# Patient Record
Sex: Female | Born: 1935
Health system: Southern US, Community
[De-identification: ages and names within clinical notes are randomized; demographics above are authoritative.]

## PROBLEM LIST (undated history)

## (undated) DIAGNOSIS — Z9221 Personal history of antineoplastic chemotherapy: Secondary | ICD-10-CM

## (undated) DIAGNOSIS — C50919 Malignant neoplasm of unspecified site of unspecified female breast: Secondary | ICD-10-CM

## (undated) DIAGNOSIS — N3010 Interstitial cystitis (chronic) without hematuria: Secondary | ICD-10-CM

## (undated) DIAGNOSIS — C50912 Malignant neoplasm of unspecified site of left female breast: Secondary | ICD-10-CM

## (undated) DIAGNOSIS — M1990 Unspecified osteoarthritis, unspecified site: Secondary | ICD-10-CM

## (undated) DIAGNOSIS — H3530 Unspecified macular degeneration: Secondary | ICD-10-CM

## (undated) DIAGNOSIS — K08109 Complete loss of teeth, unspecified cause, unspecified class: Secondary | ICD-10-CM

## (undated) DIAGNOSIS — I809 Phlebitis and thrombophlebitis of unspecified site: Secondary | ICD-10-CM

## (undated) DIAGNOSIS — K2970 Gastritis, unspecified, without bleeding: Secondary | ICD-10-CM

## (undated) DIAGNOSIS — Z923 Personal history of irradiation: Secondary | ICD-10-CM

## (undated) DIAGNOSIS — C3490 Malignant neoplasm of unspecified part of unspecified bronchus or lung: Principal | ICD-10-CM

## (undated) DIAGNOSIS — R0602 Shortness of breath: Secondary | ICD-10-CM

## (undated) DIAGNOSIS — J45909 Unspecified asthma, uncomplicated: Secondary | ICD-10-CM

## (undated) DIAGNOSIS — Z972 Presence of dental prosthetic device (complete) (partial): Secondary | ICD-10-CM

## (undated) DIAGNOSIS — Z973 Presence of spectacles and contact lenses: Secondary | ICD-10-CM

## (undated) DIAGNOSIS — K5790 Diverticulosis of intestine, part unspecified, without perforation or abscess without bleeding: Secondary | ICD-10-CM

## (undated) HISTORY — PX: EYE SURGERY: SHX253

## (undated) HISTORY — DX: Malignant neoplasm of unspecified part of unspecified bronchus or lung: C34.90

## (undated) HISTORY — PX: CHOLECYSTECTOMY: SHX55

## (undated) HISTORY — PX: BREAST SURGERY: SHX581

## (undated) HISTORY — DX: Diverticulosis of intestine, part unspecified, without perforation or abscess without bleeding: K57.90

## (undated) HISTORY — DX: Malignant neoplasm of unspecified site of unspecified female breast: C50.919

## (undated) HISTORY — DX: Phlebitis and thrombophlebitis of unspecified site: I80.9

## (undated) HISTORY — DX: Unspecified asthma, uncomplicated: J45.909

## (undated) HISTORY — DX: Gastritis, unspecified, without bleeding: K29.70

## (undated) HISTORY — DX: Unspecified macular degeneration: H35.30

## (undated) HISTORY — DX: Interstitial cystitis (chronic) without hematuria: N30.10

---

## 1968-08-01 DIAGNOSIS — I809 Phlebitis and thrombophlebitis of unspecified site: Secondary | ICD-10-CM

## 1968-08-01 HISTORY — DX: Phlebitis and thrombophlebitis of unspecified site: I80.9

## 1999-10-01 ENCOUNTER — Other Ambulatory Visit: Admission: RE | Admit: 1999-10-01 | Discharge: 1999-10-20 | Payer: Self-pay | Admitting: *Deleted

## 2000-08-14 ENCOUNTER — Ambulatory Visit (HOSPITAL_COMMUNITY): Admission: RE | Admit: 2000-08-14 | Discharge: 2000-08-14 | Payer: Self-pay | Admitting: *Deleted

## 2001-01-04 ENCOUNTER — Other Ambulatory Visit: Admission: RE | Admit: 2001-01-04 | Discharge: 2001-01-04 | Payer: Self-pay | Admitting: *Deleted

## 2002-11-25 ENCOUNTER — Other Ambulatory Visit: Admission: RE | Admit: 2002-11-25 | Discharge: 2002-11-25 | Payer: Self-pay | Admitting: Obstetrics and Gynecology

## 2003-12-03 ENCOUNTER — Ambulatory Visit (HOSPITAL_COMMUNITY): Admission: RE | Admit: 2003-12-03 | Discharge: 2003-12-03 | Payer: Self-pay | Admitting: *Deleted

## 2005-01-18 ENCOUNTER — Other Ambulatory Visit: Admission: RE | Admit: 2005-01-18 | Discharge: 2005-01-18 | Payer: Self-pay | Admitting: Obstetrics and Gynecology

## 2006-07-14 ENCOUNTER — Encounter: Admission: RE | Admit: 2006-07-14 | Discharge: 2006-07-14 | Payer: Self-pay | Admitting: Internal Medicine

## 2006-07-27 ENCOUNTER — Ambulatory Visit (HOSPITAL_COMMUNITY): Admission: RE | Admit: 2006-07-27 | Discharge: 2006-07-27 | Payer: Self-pay | Admitting: Internal Medicine

## 2006-08-01 DIAGNOSIS — C3490 Malignant neoplasm of unspecified part of unspecified bronchus or lung: Secondary | ICD-10-CM

## 2006-08-01 HISTORY — DX: Malignant neoplasm of unspecified part of unspecified bronchus or lung: C34.90

## 2006-09-01 ENCOUNTER — Encounter (INDEPENDENT_AMBULATORY_CARE_PROVIDER_SITE_OTHER): Payer: Self-pay | Admitting: Specialist

## 2006-09-01 ENCOUNTER — Ambulatory Visit: Payer: Self-pay | Admitting: Thoracic Surgery

## 2006-09-01 ENCOUNTER — Inpatient Hospital Stay (HOSPITAL_COMMUNITY): Admission: RE | Admit: 2006-09-01 | Discharge: 2006-09-06 | Payer: Self-pay | Admitting: Thoracic Surgery

## 2006-09-01 HISTORY — PX: OTHER SURGICAL HISTORY: SHX169

## 2006-09-11 ENCOUNTER — Ambulatory Visit: Payer: Self-pay | Admitting: Oncology

## 2006-09-12 ENCOUNTER — Ambulatory Visit: Payer: Self-pay | Admitting: Thoracic Surgery

## 2006-09-12 ENCOUNTER — Encounter: Admission: RE | Admit: 2006-09-12 | Discharge: 2006-09-12 | Payer: Self-pay | Admitting: Thoracic Surgery

## 2006-09-26 LAB — CBC WITH DIFFERENTIAL/PLATELET
Basophils Absolute: 0 10*3/uL (ref 0.0–0.1)
EOS%: 1.6 % (ref 0.0–7.0)
HCT: 34.2 % — ABNORMAL LOW (ref 34.8–46.6)
HGB: 12 g/dL (ref 11.6–15.9)
MCH: 30.9 pg (ref 26.0–34.0)
MONO#: 0.2 10*3/uL (ref 0.1–0.9)
NEUT#: 2.8 10*3/uL (ref 1.5–6.5)
NEUT%: 68.8 % (ref 39.6–76.8)
RDW: 13.3 % (ref 11.3–14.5)
WBC: 4 10*3/uL (ref 3.9–10.0)
lymph#: 0.9 10*3/uL (ref 0.9–3.3)

## 2006-09-26 LAB — COMPREHENSIVE METABOLIC PANEL
AST: 23 U/L (ref 0–37)
Albumin: 4.3 g/dL (ref 3.5–5.2)
Alkaline Phosphatase: 36 U/L — ABNORMAL LOW (ref 39–117)
BUN: 9 mg/dL (ref 6–23)
Calcium: 10.1 mg/dL (ref 8.4–10.5)
Chloride: 100 mEq/L (ref 96–112)
Glucose, Bld: 155 mg/dL — ABNORMAL HIGH (ref 70–99)
Potassium: 4.2 mEq/L (ref 3.5–5.3)
Sodium: 139 mEq/L (ref 135–145)
Total Protein: 7 g/dL (ref 6.0–8.3)

## 2006-09-26 LAB — LACTATE DEHYDROGENASE: LDH: 118 U/L (ref 94–250)

## 2006-09-28 ENCOUNTER — Ambulatory Visit (HOSPITAL_COMMUNITY): Admission: RE | Admit: 2006-09-28 | Discharge: 2006-09-28 | Payer: Self-pay | Admitting: Oncology

## 2006-10-04 ENCOUNTER — Encounter: Admission: RE | Admit: 2006-10-04 | Discharge: 2006-10-04 | Payer: Self-pay | Admitting: Thoracic Surgery

## 2006-10-10 LAB — CBC WITH DIFFERENTIAL/PLATELET
Basophils Absolute: 0 10*3/uL (ref 0.0–0.1)
EOS%: 2.7 % (ref 0.0–7.0)
HCT: 31.5 % — ABNORMAL LOW (ref 34.8–46.6)
HGB: 11.2 g/dL — ABNORMAL LOW (ref 11.6–15.9)
MONO#: 0.1 10*3/uL (ref 0.1–0.9)
NEUT#: 1.1 10*3/uL — ABNORMAL LOW (ref 1.5–6.5)
NEUT%: 49.4 % (ref 39.6–76.8)
RDW: 12.9 % (ref 11.3–14.5)
WBC: 2.2 10*3/uL — ABNORMAL LOW (ref 3.9–10.0)
lymph#: 1 10*3/uL (ref 0.9–3.3)

## 2006-10-11 ENCOUNTER — Ambulatory Visit: Payer: Self-pay | Admitting: Thoracic Surgery

## 2006-10-16 ENCOUNTER — Ambulatory Visit (HOSPITAL_COMMUNITY): Admission: RE | Admit: 2006-10-16 | Discharge: 2006-10-16 | Payer: Self-pay | Admitting: Oncology

## 2006-10-16 LAB — CBC WITH DIFFERENTIAL/PLATELET
Basophils Absolute: 0.1 10*3/uL (ref 0.0–0.1)
EOS%: 0.4 % (ref 0.0–7.0)
Eosinophils Absolute: 0 10*3/uL (ref 0.0–0.5)
HCT: 30.8 % — ABNORMAL LOW (ref 34.8–46.6)
HGB: 10.9 g/dL — ABNORMAL LOW (ref 11.6–15.9)
MCH: 30.7 pg (ref 26.0–34.0)
MCV: 87 fL (ref 81.0–101.0)
MONO%: 2.4 % (ref 0.0–13.0)
NEUT#: 6.5 10*3/uL (ref 1.5–6.5)
NEUT%: 79.6 % — ABNORMAL HIGH (ref 39.6–76.8)
RDW: 12.9 % (ref 11.3–14.5)
lymph#: 1.4 10*3/uL (ref 0.9–3.3)

## 2006-10-17 LAB — D-DIMER, QUANTITATIVE: D-Dimer, Quant: 0.37 ug/mL-FEU (ref 0.00–0.48)

## 2006-10-24 LAB — CBC WITH DIFFERENTIAL/PLATELET
Basophils Absolute: 0.1 10*3/uL (ref 0.0–0.1)
HCT: 32.1 % — ABNORMAL LOW (ref 34.8–46.6)
HGB: 11.2 g/dL — ABNORMAL LOW (ref 11.6–15.9)
LYMPH%: 21.6 % (ref 14.0–48.0)
MONO#: 0.5 10*3/uL (ref 0.1–0.9)
NEUT%: 68.6 % (ref 39.6–76.8)
Platelets: 347 10*3/uL (ref 145–400)
WBC: 5.7 10*3/uL (ref 3.9–10.0)
lymph#: 1.2 10*3/uL (ref 0.9–3.3)

## 2006-10-24 LAB — COMPREHENSIVE METABOLIC PANEL
AST: 29 U/L (ref 0–37)
Alkaline Phosphatase: 44 U/L (ref 39–117)
BUN: 9 mg/dL (ref 6–23)
Creatinine, Ser: 0.65 mg/dL (ref 0.40–1.20)
Total Bilirubin: 0.3 mg/dL (ref 0.3–1.2)

## 2006-10-25 ENCOUNTER — Ambulatory Visit: Payer: Self-pay | Admitting: Oncology

## 2006-10-30 ENCOUNTER — Ambulatory Visit: Payer: Self-pay | Admitting: Critical Care Medicine

## 2006-10-31 LAB — CBC WITH DIFFERENTIAL/PLATELET
Basophils Absolute: 0 10*3/uL (ref 0.0–0.1)
Eosinophils Absolute: 0 10*3/uL (ref 0.0–0.5)
HGB: 9.6 g/dL — ABNORMAL LOW (ref 11.6–15.9)
LYMPH%: 23.7 % (ref 14.0–48.0)
MCV: 86.6 fL (ref 81.0–101.0)
MONO%: 1.3 % (ref 0.0–13.0)
NEUT#: 2.3 10*3/uL (ref 1.5–6.5)
Platelets: 192 10*3/uL (ref 145–400)

## 2006-11-07 LAB — URINALYSIS, MICROSCOPIC - CHCC
Blood: NEGATIVE
Glucose: NEGATIVE g/dL
Leukocyte Esterase: NEGATIVE
Nitrite: NEGATIVE
Protein: NEGATIVE mg/dL

## 2006-11-07 LAB — CBC WITH DIFFERENTIAL/PLATELET
Eosinophils Absolute: 0 10*3/uL (ref 0.0–0.5)
LYMPH%: 10.3 % — ABNORMAL LOW (ref 14.0–48.0)
MCHC: 35.5 g/dL (ref 32.0–36.0)
MCV: 87.3 fL (ref 81.0–101.0)
MONO%: 4.3 % (ref 0.0–13.0)
NEUT#: 13.7 10*3/uL — ABNORMAL HIGH (ref 1.5–6.5)
NEUT%: 84.9 % — ABNORMAL HIGH (ref 39.6–76.8)
Platelets: 63 10*3/uL — ABNORMAL LOW (ref 145–400)
RBC: 2.99 10*6/uL — ABNORMAL LOW (ref 3.70–5.32)

## 2006-11-08 LAB — URINE CULTURE

## 2006-11-14 LAB — CBC WITH DIFFERENTIAL/PLATELET
Basophils Absolute: 0.1 10*3/uL (ref 0.0–0.1)
EOS%: 0.6 % (ref 0.0–7.0)
HGB: 9.5 g/dL — ABNORMAL LOW (ref 11.6–15.9)
MCH: 30.4 pg (ref 26.0–34.0)
MONO#: 0.5 10*3/uL (ref 0.1–0.9)
NEUT#: 4.5 10*3/uL (ref 1.5–6.5)
RDW: 16.4 % — ABNORMAL HIGH (ref 11.3–14.5)
WBC: 6.4 10*3/uL (ref 3.9–10.0)
lymph#: 1.2 10*3/uL (ref 0.9–3.3)

## 2006-11-14 LAB — COMPREHENSIVE METABOLIC PANEL
ALT: 25 U/L (ref 0–35)
AST: 29 U/L (ref 0–37)
Albumin: 4.6 g/dL (ref 3.5–5.2)
BUN: 8 mg/dL (ref 6–23)
Calcium: 9.1 mg/dL (ref 8.4–10.5)
Chloride: 101 mEq/L (ref 96–112)
Potassium: 4.2 mEq/L (ref 3.5–5.3)

## 2006-11-21 LAB — CBC WITH DIFFERENTIAL/PLATELET
EOS%: 0.3 % (ref 0.0–7.0)
MCH: 31.7 pg (ref 26.0–34.0)
MCV: 88.9 fL (ref 81.0–101.0)
MONO%: 2.3 % (ref 0.0–13.0)
NEUT#: 3.3 10*3/uL (ref 1.5–6.5)
RBC: 2.81 10*6/uL — ABNORMAL LOW (ref 3.70–5.32)
RDW: 17.6 % — ABNORMAL HIGH (ref 11.3–14.5)

## 2006-11-22 ENCOUNTER — Encounter: Admission: RE | Admit: 2006-11-22 | Discharge: 2006-11-22 | Payer: Self-pay | Admitting: Thoracic Surgery

## 2006-11-22 ENCOUNTER — Encounter (HOSPITAL_COMMUNITY): Admission: RE | Admit: 2006-11-22 | Discharge: 2006-12-15 | Payer: Self-pay | Admitting: Oncology

## 2006-11-22 ENCOUNTER — Ambulatory Visit: Payer: Self-pay | Admitting: Thoracic Surgery

## 2006-11-23 LAB — TYPE & CROSSMATCH - CHCC

## 2006-11-28 LAB — CBC WITH DIFFERENTIAL/PLATELET
Eosinophils Absolute: 0.1 10*3/uL (ref 0.0–0.5)
HGB: 11.2 g/dL — ABNORMAL LOW (ref 11.6–15.9)
LYMPH%: 16.3 % (ref 14.0–48.0)
MONO#: 0.6 10*3/uL (ref 0.1–0.9)
NEUT#: 6.6 10*3/uL — ABNORMAL HIGH (ref 1.5–6.5)
Platelets: 74 10*3/uL — ABNORMAL LOW (ref 145–400)
RBC: 3.71 10*6/uL (ref 3.70–5.32)
RDW: 16 % — ABNORMAL HIGH (ref 11.3–14.5)
WBC: 8.8 10*3/uL (ref 3.9–10.0)

## 2006-12-04 LAB — CBC WITH DIFFERENTIAL/PLATELET
Basophils Absolute: 0.1 10*3/uL (ref 0.0–0.1)
Eosinophils Absolute: 0 10*3/uL (ref 0.0–0.5)
HCT: 33.3 % — ABNORMAL LOW (ref 34.8–46.6)
LYMPH%: 18.6 % (ref 14.0–48.0)
MONO#: 0.4 10*3/uL (ref 0.1–0.9)
NEUT#: 4 10*3/uL (ref 1.5–6.5)
NEUT%: 72.8 % (ref 39.6–76.8)
Platelets: 192 10*3/uL (ref 145–400)
WBC: 5.6 10*3/uL (ref 3.9–10.0)

## 2006-12-04 LAB — COMPREHENSIVE METABOLIC PANEL
CO2: 28 mEq/L (ref 19–32)
Creatinine, Ser: 0.64 mg/dL (ref 0.40–1.20)
Glucose, Bld: 168 mg/dL — ABNORMAL HIGH (ref 70–99)
Total Bilirubin: 0.3 mg/dL (ref 0.3–1.2)
Total Protein: 7 g/dL (ref 6.0–8.3)

## 2006-12-04 LAB — LACTATE DEHYDROGENASE: LDH: 139 U/L (ref 94–250)

## 2006-12-05 ENCOUNTER — Ambulatory Visit: Payer: Self-pay | Admitting: Oncology

## 2006-12-12 LAB — CBC WITH DIFFERENTIAL/PLATELET
BASO%: 0.3 % (ref 0.0–2.0)
HCT: 28.4 % — ABNORMAL LOW (ref 34.8–46.6)
HGB: 9.8 g/dL — ABNORMAL LOW (ref 11.6–15.9)
MCHC: 34.5 g/dL (ref 32.0–36.0)
MONO#: 0.1 10*3/uL (ref 0.1–0.9)
NEUT%: 78.5 % — ABNORMAL HIGH (ref 39.6–76.8)
WBC: 4.3 10*3/uL (ref 3.9–10.0)
lymph#: 0.8 10*3/uL — ABNORMAL LOW (ref 0.9–3.3)

## 2006-12-15 LAB — CBC WITH DIFFERENTIAL/PLATELET
Basophils Absolute: 0 10*3/uL (ref 0.0–0.1)
EOS%: 0.5 % (ref 0.0–7.0)
HCT: 26.4 % — ABNORMAL LOW (ref 34.8–46.6)
HGB: 9.5 g/dL — ABNORMAL LOW (ref 11.6–15.9)
MCH: 31.5 pg (ref 26.0–34.0)
MCV: 87.4 fL (ref 81.0–101.0)
MONO%: 6.4 % (ref 0.0–13.0)
NEUT%: 58.7 % (ref 39.6–76.8)
Platelets: 70 10*3/uL — ABNORMAL LOW (ref 145–400)

## 2006-12-19 LAB — CBC WITH DIFFERENTIAL/PLATELET
Eosinophils Absolute: 0 10*3/uL (ref 0.0–0.5)
LYMPH%: 15.9 % (ref 14.0–48.0)
MCV: 89.3 fL (ref 81.0–101.0)
MONO%: 3.9 % (ref 0.0–13.0)
NEUT#: 6.4 10*3/uL (ref 1.5–6.5)
Platelets: 62 10*3/uL — ABNORMAL LOW (ref 145–400)
RBC: 3.87 10*6/uL (ref 3.70–5.32)

## 2006-12-26 LAB — CBC WITH DIFFERENTIAL/PLATELET
BASO%: 0.8 % (ref 0.0–2.0)
EOS%: 0.6 % (ref 0.0–7.0)
LYMPH%: 18.8 % (ref 14.0–48.0)
MCH: 31.7 pg (ref 26.0–34.0)
MCHC: 35 g/dL (ref 32.0–36.0)
MCV: 90.4 fL (ref 81.0–101.0)
MONO#: 0.6 10*3/uL (ref 0.1–0.9)
MONO%: 8.5 % (ref 0.0–13.0)
Platelets: 182 10*3/uL (ref 145–400)
RBC: 3.84 10*6/uL (ref 3.70–5.32)
WBC: 7.2 10*3/uL (ref 3.9–10.0)

## 2007-01-01 LAB — CBC WITH DIFFERENTIAL/PLATELET
BASO%: 1 % (ref 0.0–2.0)
EOS%: 0.6 % (ref 0.0–7.0)
MCHC: 35.1 g/dL (ref 32.0–36.0)
MONO#: 0.4 10*3/uL (ref 0.1–0.9)
RBC: 3.94 10*6/uL (ref 3.70–5.32)
RDW: 21.8 % — ABNORMAL HIGH (ref 11.3–14.5)
WBC: 4.2 10*3/uL (ref 3.9–10.0)
lymph#: 0.9 10*3/uL (ref 0.9–3.3)

## 2007-01-01 LAB — COMPREHENSIVE METABOLIC PANEL
ALT: 31 U/L (ref 0–35)
AST: 52 U/L — ABNORMAL HIGH (ref 0–37)
CO2: 26 mEq/L (ref 19–32)
Calcium: 9.8 mg/dL (ref 8.4–10.5)
Chloride: 101 mEq/L (ref 96–112)
Sodium: 137 mEq/L (ref 135–145)
Total Protein: 7 g/dL (ref 6.0–8.3)

## 2007-01-01 LAB — LACTATE DEHYDROGENASE: LDH: 149 U/L (ref 94–250)

## 2007-01-29 ENCOUNTER — Ambulatory Visit: Payer: Self-pay | Admitting: Oncology

## 2007-01-30 LAB — CBC WITH DIFFERENTIAL/PLATELET
BASO%: 0.5 % (ref 0.0–2.0)
Basophils Absolute: 0 10*3/uL (ref 0.0–0.1)
EOS%: 3.1 % (ref 0.0–7.0)
HCT: 34.8 % (ref 34.8–46.6)
LYMPH%: 30.4 % (ref 14.0–48.0)
MCH: 33 pg (ref 26.0–34.0)
MCHC: 35.3 g/dL (ref 32.0–36.0)
MCV: 93.4 fL (ref 81.0–101.0)
MONO%: 7.1 % (ref 0.0–13.0)
NEUT%: 58.9 % (ref 39.6–76.8)
Platelets: 169 10*3/uL (ref 145–400)
lymph#: 1.1 10*3/uL (ref 0.9–3.3)

## 2007-01-30 LAB — COMPREHENSIVE METABOLIC PANEL
ALT: 27 U/L (ref 0–35)
AST: 33 U/L (ref 0–37)
Alkaline Phosphatase: 31 U/L — ABNORMAL LOW (ref 39–117)
BUN: 8 mg/dL (ref 6–23)
Creatinine, Ser: 0.7 mg/dL (ref 0.40–1.20)
Total Bilirubin: 0.4 mg/dL (ref 0.3–1.2)

## 2007-02-14 ENCOUNTER — Encounter: Admission: RE | Admit: 2007-02-14 | Discharge: 2007-02-14 | Payer: Self-pay | Admitting: Thoracic Surgery

## 2007-02-14 ENCOUNTER — Ambulatory Visit: Payer: Self-pay | Admitting: Thoracic Surgery

## 2007-04-03 ENCOUNTER — Ambulatory Visit (HOSPITAL_COMMUNITY): Admission: RE | Admit: 2007-04-03 | Discharge: 2007-04-03 | Payer: Self-pay | Admitting: Oncology

## 2007-04-05 ENCOUNTER — Ambulatory Visit: Payer: Self-pay | Admitting: Oncology

## 2007-04-09 LAB — CBC WITH DIFFERENTIAL/PLATELET
Basophils Absolute: 0 10*3/uL (ref 0.0–0.1)
EOS%: 1.4 % (ref 0.0–7.0)
Eosinophils Absolute: 0.1 10*3/uL (ref 0.0–0.5)
HCT: 35.6 % (ref 34.8–46.6)
HGB: 13 g/dL (ref 11.6–15.9)
MCH: 33.9 pg (ref 26.0–34.0)
MCV: 93.1 fL (ref 81.0–101.0)
MONO%: 5.3 % (ref 0.0–13.0)
NEUT#: 3 10*3/uL (ref 1.5–6.5)
NEUT%: 66.8 % (ref 39.6–76.8)
RDW: 12.6 % (ref 11.3–14.5)
lymph#: 1.2 10*3/uL (ref 0.9–3.3)

## 2007-04-09 LAB — COMPREHENSIVE METABOLIC PANEL
ALT: 25 U/L (ref 0–35)
AST: 40 U/L — ABNORMAL HIGH (ref 0–37)
Albumin: 4.5 g/dL (ref 3.5–5.2)
Calcium: 9.9 mg/dL (ref 8.4–10.5)
Chloride: 100 mEq/L (ref 96–112)
Potassium: 4 mEq/L (ref 3.5–5.3)
Total Protein: 7.2 g/dL (ref 6.0–8.3)

## 2007-04-24 ENCOUNTER — Encounter: Admission: RE | Admit: 2007-04-24 | Discharge: 2007-04-24 | Payer: Self-pay | Admitting: Pediatrics

## 2007-06-01 ENCOUNTER — Ambulatory Visit: Payer: Self-pay | Admitting: Oncology

## 2007-06-04 ENCOUNTER — Ambulatory Visit (HOSPITAL_COMMUNITY): Admission: RE | Admit: 2007-06-04 | Discharge: 2007-06-04 | Payer: Self-pay | Admitting: Oncology

## 2007-06-06 ENCOUNTER — Ambulatory Visit (HOSPITAL_COMMUNITY): Admission: RE | Admit: 2007-06-06 | Discharge: 2007-06-06 | Payer: Self-pay | Admitting: Oncology

## 2007-06-11 LAB — URINALYSIS, MICROSCOPIC - CHCC
Bilirubin (Urine): NEGATIVE
Specific Gravity, Urine: 1.025 (ref 1.003–1.035)
pH: 6 (ref 4.6–8.0)

## 2007-06-11 LAB — CBC WITH DIFFERENTIAL/PLATELET
Basophils Absolute: 0 10*3/uL (ref 0.0–0.1)
EOS%: 1 % (ref 0.0–7.0)
MCH: 33 pg (ref 26.0–34.0)
MCV: 92.7 fL (ref 81.0–101.0)
MONO%: 7.3 % (ref 0.0–13.0)
RBC: 3.8 10*6/uL (ref 3.70–5.32)
RDW: 13.2 % (ref 11.3–14.5)

## 2007-06-11 LAB — COMPREHENSIVE METABOLIC PANEL
AST: 38 U/L — ABNORMAL HIGH (ref 0–37)
Albumin: 4.4 g/dL (ref 3.5–5.2)
Alkaline Phosphatase: 38 U/L — ABNORMAL LOW (ref 39–117)
BUN: 8 mg/dL (ref 6–23)
Potassium: 4 mEq/L (ref 3.5–5.3)
Sodium: 137 mEq/L (ref 135–145)

## 2007-06-13 LAB — URINE CULTURE

## 2007-08-09 ENCOUNTER — Ambulatory Visit: Payer: Self-pay | Admitting: Oncology

## 2007-08-13 LAB — COMPREHENSIVE METABOLIC PANEL
ALT: 9 U/L (ref 0–35)
AST: 15 U/L (ref 0–37)
Albumin: 4.3 g/dL (ref 3.5–5.2)
BUN: 10 mg/dL (ref 6–23)
CO2: 27 mEq/L (ref 19–32)
Chloride: 100 mEq/L (ref 96–112)
Creatinine, Ser: 0.81 mg/dL (ref 0.40–1.20)
Potassium: 4 mEq/L (ref 3.5–5.3)

## 2007-08-13 LAB — CBC WITH DIFFERENTIAL/PLATELET
BASO%: 0.2 % (ref 0.0–2.0)
Basophils Absolute: 0 10*3/uL (ref 0.0–0.1)
EOS%: 3.9 % (ref 0.0–7.0)
HCT: 34.5 % — ABNORMAL LOW (ref 34.8–46.6)
HGB: 12.2 g/dL (ref 11.6–15.9)
MCH: 32.2 pg (ref 26.0–34.0)
MONO#: 0.3 10*3/uL (ref 0.1–0.9)
NEUT%: 66.4 % (ref 39.6–76.8)
RDW: 13.1 % (ref 11.3–14.5)
WBC: 4.6 10*3/uL (ref 3.9–10.0)
lymph#: 1 10*3/uL (ref 0.9–3.3)

## 2007-08-15 ENCOUNTER — Ambulatory Visit: Payer: Self-pay | Admitting: Thoracic Surgery

## 2007-08-15 ENCOUNTER — Encounter: Admission: RE | Admit: 2007-08-15 | Discharge: 2007-08-15 | Payer: Self-pay | Admitting: Thoracic Surgery

## 2007-10-10 ENCOUNTER — Ambulatory Visit: Payer: Self-pay | Admitting: Oncology

## 2007-10-12 LAB — COMPREHENSIVE METABOLIC PANEL
ALT: 23 U/L (ref 0–35)
BUN: 7 mg/dL (ref 6–23)
CO2: 28 mEq/L (ref 19–32)
Calcium: 9.3 mg/dL (ref 8.4–10.5)
Chloride: 98 mEq/L (ref 96–112)
Creatinine, Ser: 0.74 mg/dL (ref 0.40–1.20)
Glucose, Bld: 109 mg/dL — ABNORMAL HIGH (ref 70–99)
Total Bilirubin: 0.3 mg/dL (ref 0.3–1.2)

## 2007-10-12 LAB — CBC WITH DIFFERENTIAL/PLATELET
Basophils Absolute: 0 10*3/uL (ref 0.0–0.1)
EOS%: 3.7 % (ref 0.0–7.0)
HGB: 12.1 g/dL (ref 11.6–15.9)
MCH: 32.2 pg (ref 26.0–34.0)
MCHC: 35.5 g/dL (ref 32.0–36.0)
MCV: 90.8 fL (ref 81.0–101.0)
MONO%: 8.4 % (ref 0.0–13.0)
NEUT%: 55.1 % (ref 39.6–76.8)
RDW: 13.5 % (ref 11.3–14.5)

## 2007-10-12 LAB — LACTATE DEHYDROGENASE: LDH: 121 U/L (ref 94–250)

## 2007-11-29 ENCOUNTER — Encounter: Admission: RE | Admit: 2007-11-29 | Discharge: 2007-11-29 | Payer: Self-pay | Admitting: Thoracic Surgery

## 2007-12-05 ENCOUNTER — Ambulatory Visit: Payer: Self-pay | Admitting: Thoracic Surgery

## 2007-12-11 ENCOUNTER — Ambulatory Visit: Payer: Self-pay | Admitting: Oncology

## 2008-03-11 ENCOUNTER — Ambulatory Visit: Payer: Self-pay | Admitting: Oncology

## 2008-03-13 LAB — COMPREHENSIVE METABOLIC PANEL
ALT: 25 U/L (ref 0–35)
AST: 34 U/L (ref 0–37)
Calcium: 9.8 mg/dL (ref 8.4–10.5)
Chloride: 97 mEq/L (ref 96–112)
Creatinine, Ser: 0.67 mg/dL (ref 0.40–1.20)
Potassium: 4.3 mEq/L (ref 3.5–5.3)
Sodium: 136 mEq/L (ref 135–145)
Total Protein: 7.2 g/dL (ref 6.0–8.3)

## 2008-03-13 LAB — CBC WITH DIFFERENTIAL/PLATELET
BASO%: 0.5 % (ref 0.0–2.0)
EOS%: 2.8 % (ref 0.0–7.0)
MCH: 32.8 pg (ref 26.0–34.0)
MCHC: 35.1 g/dL (ref 32.0–36.0)
MONO#: 0.3 10*3/uL (ref 0.1–0.9)
NEUT%: 57.3 % (ref 39.6–76.8)
RBC: 3.74 10*6/uL (ref 3.70–5.32)
RDW: 13.5 % (ref 11.3–14.5)
WBC: 4.4 10*3/uL (ref 3.9–10.0)
lymph#: 1.4 10*3/uL (ref 0.9–3.3)

## 2008-06-10 ENCOUNTER — Ambulatory Visit: Payer: Self-pay | Admitting: Oncology

## 2008-06-11 ENCOUNTER — Encounter: Admission: RE | Admit: 2008-06-11 | Discharge: 2008-06-11 | Payer: Self-pay | Admitting: Internal Medicine

## 2008-06-12 LAB — CBC WITH DIFFERENTIAL/PLATELET
Eosinophils Absolute: 0.1 10*3/uL (ref 0.0–0.5)
MONO#: 0.3 10*3/uL (ref 0.1–0.9)
NEUT#: 2.6 10*3/uL (ref 1.5–6.5)
Platelets: 187 10*3/uL (ref 145–400)
RBC: 3.87 10*6/uL (ref 3.70–5.32)
RDW: 13.2 % (ref 11.3–14.5)
WBC: 4.4 10*3/uL (ref 3.9–10.0)

## 2008-06-12 LAB — COMPREHENSIVE METABOLIC PANEL
ALT: 32 U/L (ref 0–35)
CO2: 28 mEq/L (ref 19–32)
Calcium: 10.2 mg/dL (ref 8.4–10.5)
Chloride: 97 mEq/L (ref 96–112)
Glucose, Bld: 137 mg/dL — ABNORMAL HIGH (ref 70–99)
Sodium: 133 mEq/L — ABNORMAL LOW (ref 135–145)
Total Protein: 7.2 g/dL (ref 6.0–8.3)

## 2008-06-12 LAB — LACTATE DEHYDROGENASE: LDH: 145 U/L (ref 94–250)

## 2008-06-23 ENCOUNTER — Ambulatory Visit (HOSPITAL_COMMUNITY): Admission: RE | Admit: 2008-06-23 | Discharge: 2008-06-23 | Payer: Self-pay | Admitting: Oncology

## 2008-09-10 ENCOUNTER — Ambulatory Visit: Payer: Self-pay | Admitting: Oncology

## 2008-10-02 LAB — CBC WITH DIFFERENTIAL/PLATELET
Basophils Absolute: 0 10*3/uL (ref 0.0–0.1)
Eosinophils Absolute: 0.1 10*3/uL (ref 0.0–0.5)
HCT: 34.2 % — ABNORMAL LOW (ref 34.8–46.6)
HGB: 11.9 g/dL (ref 11.6–15.9)
MCV: 93.2 fL (ref 79.5–101.0)
MONO%: 8.2 % (ref 0.0–14.0)
NEUT#: 2.4 10*3/uL (ref 1.5–6.5)
NEUT%: 55.6 % (ref 38.4–76.8)
Platelets: 176 10*3/uL (ref 145–400)
RDW: 13.6 % (ref 11.2–14.5)

## 2008-10-02 LAB — COMPREHENSIVE METABOLIC PANEL
Albumin: 4.4 g/dL (ref 3.5–5.2)
Alkaline Phosphatase: 38 U/L — ABNORMAL LOW (ref 39–117)
BUN: 15 mg/dL (ref 6–23)
Calcium: 9.5 mg/dL (ref 8.4–10.5)
Glucose, Bld: 92 mg/dL (ref 70–99)
Potassium: 4.7 mEq/L (ref 3.5–5.3)

## 2009-01-01 ENCOUNTER — Ambulatory Visit: Payer: Self-pay | Admitting: Oncology

## 2009-01-05 LAB — CBC WITH DIFFERENTIAL/PLATELET
BASO%: 0.5 % (ref 0.0–2.0)
Basophils Absolute: 0 10*3/uL (ref 0.0–0.1)
EOS%: 4 % (ref 0.0–7.0)
HCT: 34.4 % — ABNORMAL LOW (ref 34.8–46.6)
HGB: 12.3 g/dL (ref 11.6–15.9)
MCH: 32.4 pg (ref 25.1–34.0)
MCHC: 35.7 g/dL (ref 31.5–36.0)
MCV: 90.6 fL (ref 79.5–101.0)
MONO%: 7.9 % (ref 0.0–14.0)
NEUT%: 61.2 % (ref 38.4–76.8)

## 2009-01-05 LAB — COMPREHENSIVE METABOLIC PANEL
AST: 31 U/L (ref 0–37)
Alkaline Phosphatase: 32 U/L — ABNORMAL LOW (ref 39–117)
BUN: 11 mg/dL (ref 6–23)
Calcium: 10 mg/dL (ref 8.4–10.5)
Creatinine, Ser: 0.79 mg/dL (ref 0.40–1.20)
Total Bilirubin: 0.3 mg/dL (ref 0.3–1.2)

## 2009-01-08 ENCOUNTER — Ambulatory Visit (HOSPITAL_COMMUNITY): Admission: RE | Admit: 2009-01-08 | Discharge: 2009-01-08 | Payer: Self-pay | Admitting: Oncology

## 2009-02-23 ENCOUNTER — Ambulatory Visit (HOSPITAL_COMMUNITY): Admission: RE | Admit: 2009-02-23 | Discharge: 2009-02-23 | Payer: Self-pay | Admitting: *Deleted

## 2009-04-02 ENCOUNTER — Ambulatory Visit: Payer: Self-pay | Admitting: Oncology

## 2009-04-07 LAB — COMPREHENSIVE METABOLIC PANEL
ALT: 24 U/L (ref 0–35)
AST: 32 U/L (ref 0–37)
CO2: 28 mEq/L (ref 19–32)
Calcium: 9.7 mg/dL (ref 8.4–10.5)
Chloride: 96 mEq/L (ref 96–112)
Creatinine, Ser: 0.74 mg/dL (ref 0.40–1.20)
Sodium: 135 mEq/L (ref 135–145)
Total Bilirubin: 0.3 mg/dL (ref 0.3–1.2)
Total Protein: 6.5 g/dL (ref 6.0–8.3)

## 2009-04-07 LAB — CBC WITH DIFFERENTIAL/PLATELET
BASO%: 0.3 % (ref 0.0–2.0)
EOS%: 2.7 % (ref 0.0–7.0)
HCT: 34.8 % (ref 34.8–46.6)
MCH: 32 pg (ref 25.1–34.0)
MCHC: 34.4 g/dL (ref 31.5–36.0)
MONO#: 0.3 10*3/uL (ref 0.1–0.9)
NEUT%: 63 % (ref 38.4–76.8)
RBC: 3.75 10*6/uL (ref 3.70–5.45)
WBC: 4.5 10*3/uL (ref 3.9–10.3)
lymph#: 1.2 10*3/uL (ref 0.9–3.3)

## 2009-07-30 ENCOUNTER — Ambulatory Visit: Payer: Self-pay | Admitting: Oncology

## 2009-08-01 DIAGNOSIS — H3530 Unspecified macular degeneration: Secondary | ICD-10-CM

## 2009-08-01 HISTORY — DX: Unspecified macular degeneration: H35.30

## 2009-08-04 ENCOUNTER — Ambulatory Visit (HOSPITAL_COMMUNITY): Admission: RE | Admit: 2009-08-04 | Discharge: 2009-08-04 | Payer: Self-pay | Admitting: Internal Medicine

## 2009-08-04 LAB — COMPREHENSIVE METABOLIC PANEL
ALT: 15 U/L (ref 0–35)
AST: 22 U/L (ref 0–37)
Calcium: 10 mg/dL (ref 8.4–10.5)
Chloride: 102 mEq/L (ref 96–112)
Creatinine, Ser: 0.72 mg/dL (ref 0.40–1.20)
Potassium: 4.1 mEq/L (ref 3.5–5.3)

## 2009-08-04 LAB — CBC WITH DIFFERENTIAL/PLATELET
BASO%: 0.5 % (ref 0.0–2.0)
EOS%: 2.2 % (ref 0.0–7.0)
MCH: 32 pg (ref 25.1–34.0)
MCHC: 34.6 g/dL (ref 31.5–36.0)
NEUT%: 56.5 % (ref 38.4–76.8)
RDW: 13.2 % (ref 11.2–14.5)
lymph#: 1.5 10*3/uL (ref 0.9–3.3)

## 2009-12-03 ENCOUNTER — Ambulatory Visit: Payer: Self-pay | Admitting: Oncology

## 2009-12-04 LAB — COMPREHENSIVE METABOLIC PANEL
AST: 38 U/L — ABNORMAL HIGH (ref 0–37)
Alkaline Phosphatase: 36 U/L — ABNORMAL LOW (ref 39–117)
BUN: 9 mg/dL (ref 6–23)
Creatinine, Ser: 0.75 mg/dL (ref 0.40–1.20)
Total Bilirubin: 0.4 mg/dL (ref 0.3–1.2)

## 2009-12-04 LAB — CBC WITH DIFFERENTIAL/PLATELET
Basophils Absolute: 0 10*3/uL (ref 0.0–0.1)
EOS%: 3.2 % (ref 0.0–7.0)
HCT: 37.9 % (ref 34.8–46.6)
HGB: 12.9 g/dL (ref 11.6–15.9)
LYMPH%: 30.2 % (ref 14.0–49.7)
MCH: 31.4 pg (ref 25.1–34.0)
MCV: 92.2 fL (ref 79.5–101.0)
MONO%: 6.8 % (ref 0.0–14.0)
NEUT%: 59.2 % (ref 38.4–76.8)
Platelets: 165 10*3/uL (ref 145–400)
RDW: 12.9 % (ref 11.2–14.5)

## 2009-12-08 ENCOUNTER — Ambulatory Visit (HOSPITAL_COMMUNITY): Admission: RE | Admit: 2009-12-08 | Discharge: 2009-12-08 | Payer: Self-pay | Admitting: Oncology

## 2010-06-03 ENCOUNTER — Ambulatory Visit: Payer: Self-pay | Admitting: Oncology

## 2010-06-07 LAB — COMPREHENSIVE METABOLIC PANEL
Albumin: 4.7 g/dL (ref 3.5–5.2)
Alkaline Phosphatase: 44 U/L (ref 39–117)
BUN: 13 mg/dL (ref 6–23)
CO2: 30 mEq/L (ref 19–32)
Glucose, Bld: 109 mg/dL — ABNORMAL HIGH (ref 70–99)
Potassium: 4.2 mEq/L (ref 3.5–5.3)
Total Bilirubin: 0.4 mg/dL (ref 0.3–1.2)

## 2010-06-07 LAB — CBC WITH DIFFERENTIAL/PLATELET
Basophils Absolute: 0 10*3/uL (ref 0.0–0.1)
Eosinophils Absolute: 0.1 10*3/uL (ref 0.0–0.5)
HGB: 13.2 g/dL (ref 11.6–15.9)
LYMPH%: 27.5 % (ref 14.0–49.7)
MCV: 92.8 fL (ref 79.5–101.0)
MONO#: 0.4 10*3/uL (ref 0.1–0.9)
MONO%: 6.8 % (ref 0.0–14.0)
NEUT#: 3.8 10*3/uL (ref 1.5–6.5)
Platelets: 201 10*3/uL (ref 145–400)
WBC: 5.9 10*3/uL (ref 3.9–10.3)

## 2010-06-07 LAB — LACTATE DEHYDROGENASE: LDH: 117 U/L (ref 94–250)

## 2010-08-22 ENCOUNTER — Encounter: Payer: Self-pay | Admitting: Thoracic Surgery

## 2010-08-22 ENCOUNTER — Encounter: Payer: Self-pay | Admitting: Internal Medicine

## 2010-12-09 ENCOUNTER — Ambulatory Visit (HOSPITAL_COMMUNITY)
Admission: RE | Admit: 2010-12-09 | Discharge: 2010-12-09 | Disposition: A | Discharge status: Home / Self Care | Payer: Medicare Other | Source: Ambulatory Visit | Attending: Oncology | Admitting: Oncology

## 2010-12-09 ENCOUNTER — Encounter (HOSPITAL_BASED_OUTPATIENT_CLINIC_OR_DEPARTMENT_OTHER): Payer: Medicare Other | Admitting: Oncology

## 2010-12-09 ENCOUNTER — Other Ambulatory Visit (HOSPITAL_COMMUNITY): Payer: Self-pay | Admitting: Oncology

## 2010-12-09 DIAGNOSIS — C3410 Malignant neoplasm of upper lobe, unspecified bronchus or lung: Secondary | ICD-10-CM

## 2010-12-09 DIAGNOSIS — C3490 Malignant neoplasm of unspecified part of unspecified bronchus or lung: Secondary | ICD-10-CM

## 2010-12-09 LAB — CBC WITH DIFFERENTIAL/PLATELET
BASO%: 0.1 % (ref 0.0–2.0)
Eosinophils Absolute: 0 10*3/uL (ref 0.0–0.5)
LYMPH%: 18.8 % (ref 14.0–49.7)
MCHC: 34.1 g/dL (ref 31.5–36.0)
MONO#: 0.5 10*3/uL (ref 0.1–0.9)
NEUT#: 5.9 10*3/uL (ref 1.5–6.5)
Platelets: 232 10*3/uL (ref 145–400)
RBC: 4.44 10*6/uL (ref 3.70–5.45)
WBC: 8 10*3/uL (ref 3.9–10.3)
lymph#: 1.5 10*3/uL (ref 0.9–3.3)

## 2010-12-09 LAB — LACTATE DEHYDROGENASE: LDH: 122 U/L (ref 94–250)

## 2010-12-09 LAB — COMPREHENSIVE METABOLIC PANEL
ALT: 15 U/L (ref 0–35)
Albumin: 4.7 g/dL (ref 3.5–5.2)
CO2: 23 mEq/L (ref 19–32)
Calcium: 10.1 mg/dL (ref 8.4–10.5)
Chloride: 95 mEq/L — ABNORMAL LOW (ref 96–112)
Glucose, Bld: 115 mg/dL — ABNORMAL HIGH (ref 70–99)
Potassium: 4.4 mEq/L (ref 3.5–5.3)
Sodium: 132 mEq/L — ABNORMAL LOW (ref 135–145)
Total Protein: 7.1 g/dL (ref 6.0–8.3)

## 2010-12-14 NOTE — Letter (Signed)
Dec 05, 2007   Samul Dada, M.D.  [address] [facility]  [place]  [place] [address]   Re:  DENIAH, SAIA               DOB:  1927/07/25   Dear Roe Coombs,   I saw Mrs. Fasnacht back today.  Her CT scan showed no evidence of  recurrence, over a year since she has had surgery.  She is having some  mild shortness of breath, but overall is doing fine.  You will see her  again in 3 months, and I will just let you continue to follow her since  she was a small cell.  I will be happy to see her back again if she has  any future problems.  Her blood pressure is 124/70, pulse 88,  respirations 18, sats were 94%.   Ines Bloomer, M.D.  Electronically Signed   DPB/MEDQ  D:  12/05/2007  T:  12/05/2007  Job:  [id number]

## 2010-12-14 NOTE — Op Note (Signed)
NAMEBRYNLYN, Sandra Ferguson               ACCOUNT NO.:  [id number]   MEDICAL RECORD NO.:  [id number]          PATIENT TYPE:  AMB   LOCATION:  ENDO                         FACILITY:  [facility]   PHYSICIAN:  Georgiana Spinner, M.D.    DATE OF BIRTH:  1926/08/23   DATE OF PROCEDURE:  DATE OF DISCHARGE:                               OPERATIVE REPORT   PROCEDURE:  Colonoscopy   INDICATIONS:  Screening.  History of colon polyps.   ANESTHESIA:  Fentanyl 100 mcg, Versed 6 mg.   PROCEDURE:  With the patient mildly sedated in the left lateral  decubitus position, the Pentax videoscopic pediatric colonoscope was  inserted in the rectum, passed through diverticular filled sigmoid  colon.  With pressure applied, we reached the cecum identified by  ileocecal valve and appendiceal orifice, both of which were  photographed.  From this point,, the colonoscope was slowly withdrawn,  taking circumferential views of colonic mucosa, stopping to photograph  rare diverticula in the right side of the colon and moderate  diverticulosis seen in the sigmoid colon until we reach the rectum which  appeared normal on direct and retroflexed view.  The endoscope was  straightened and withdrawn.  The patient's vital signs, pulse and  oximeter remained stable.  The patient tolerated the procedure well  without apparent complications.   FINDINGS:  Diverticulosis throughout the colon, more so on the left  side, otherwise unremarkable exam.   PLAN:  Consider repeat examination in 5 years if appropriate due to  family history of colon cancer.           ______________________________  Georgiana Spinner, M.D.     GMO/MEDQ  D:  02/23/2009  T:  02/23/2009  Job:  [id number]

## 2010-12-14 NOTE — Assessment & Plan Note (Signed)
OFFICE VISIT   ANANIAH, MACIOLEK  DOB:  06-10-1927                                        February 14, 2007  CHART #:  [id number]   Ms. Brunner came today.  Her incisions are well healed.  Her chest x-ray  is stable after her right upper lobectomy for what turned out to be  small cell cancer.  She has completed her cycles of chemotherapy and is  getting her hair back.  Her blood pressure is 117/69, pulse 100,  respirations 18, saturation 92%.  She gets some shortness of breath with  exertion but overall is doing well.  She will see Dr. Arline Asp the first  of August.  I will see her back in 6 months with a chest x-ray.   Ines Bloomer, M.D.  Electronically Signed   DPB/MEDQ  D:  02/14/2007  T:  02/15/2007  Job:  [id number]

## 2010-12-14 NOTE — Assessment & Plan Note (Signed)
OFFICE VISIT   AVALEIGH, DECUIR  DOB:  1926/09/03                                        August 15, 2007  CHART #:  [id number]   The patient came in today, her blood pressure is 110/71, pulse 97,  respirations 18, sats are 92%.  She is doing well overall.  Her chest x-  ray showed no evidence of recurrence.  We will see her back in 3 months  with a CT scan and she will be seeing Dr. Arline Asp also in the next 2 to  3 months.   Ines Bloomer, M.D.  Electronically Signed   DPB/MEDQ  D:  08/15/2007  T:  08/15/2007  Job:  [id number]

## 2010-12-17 NOTE — Discharge Summary (Signed)
NAMEAGNIESZKA, NEWHOUSE               ACCOUNT NO.:  [id number]   MEDICAL RECORD NO.:  [id number]          PATIENT TYPE:  INP   LOCATION:  2030                         FACILITY:  [facility]   PHYSICIAN:  Rowe Clack, P.A.-C. DATE OF BIRTH:  28-Nov-1926   DATE OF ADMISSION:  09/01/2006  DATE OF DISCHARGE:  09/06/2006                               DISCHARGE SUMMARY   HISTORY OF PRESENT ILLNESS:  The patient is a 77 year old white female  with a history of cough and chest pain who was treated recently for  bronchitis.  She had a chest x-ray that showed a right lower lobe  lesion.  CT scan showed a right lower lobe lesion of 1 cm.  She had a  PET scan that showed a 3.3 SUV.  This was consistent with possible  carcinoma.  She had no evidence of mediastinal nodes or hilar nodule  uptake.  She has a remote history of tobacco use having quit 15 years  ago.  She has had intermittent wheezes but denied any hemoptysis, fevers  or chills.  Her pulmonary function test showed an FVC of 2.1 and a FEV1  of 1.66.  She was referred to D. Karle Plumber, M.D. for resection.  Was admitted this hospitalization for the procedure.   PAST MEDICAL HISTORY:  1. History of right bundle branch block.  2. History of gastritis.  3. History of interstitial cystitis.   ALLERGIES:  She is unable to tolerate STATINS.   MEDICATIONS PRIOR TO ADMISSION:  1. Fish oil.  2. Calcium.  3. Baby aspirin.   FAMILY HISTORY:  Please see the History and Physical done at the time of  admission.   SOCIAL HISTORY:  Please see the History and Physical done at the time of  admission.   REVIEW OF SYSTEMS:  Please see the History and Physical done at the time  of admission.   PHYSICAL EXAMINATION:  Please see the History and Physical done at the  time of admission.   HOSPITAL COURSE:  Patient was admitted electively on September 01, 2006  and taken to the operating room at which time she underwent the  following procedure, right  video-assisted fluoroscopy with the right  upper lobectomy and lymph node sampling.  She tolerated the procedure  well.  Was taken to the postanesthesia care unit in stable condition.   POSTOPERATIVE HOSPITAL COURSE:  Patient has done well overall.  She  initially had some low blood pressure requiring fluid challenge but  responded well.  She has had routine lines, monitors and drainage  devices discontinued in the standard fashion.  Her laboratory values do  reveal a postoperative anemia.  Most recent hemoglobin and hematocrit  dated September 06, 2006 are 10 and 28 respectively.  Electrolytes, BUN  and creatinine are within normal limits.  Her pathology initially on  frozen section was atypical carcinoid.  The final pathology has revealed  a small cell carcinoma.  She will be seen in oncology consultation by  Dr. Arline Asp.  She may require Atrovent treatment for this.  Currently  she is felt to be stable  for discharge.   MEDICATIONS ON DISCHARGE:  1. As preoperatively.  2. For pain, Ultram 50 mg every 6 hours as needed.   FOLLOWUP:  Will include Dr. Edwyna Shell with a chest x-ray.  The office will  call with this appointment.  She will additionally follow up with Dr.  Arline Asp as he requests.   INSTRUCTIONS:  The patient received written instructions regarding  medications, activity, diet, wound care and follow up.   FINAL DIAGNOSIS:  Small cell lung cancer status post right upper  lobectomy.   OTHER DIAGNOSES:  1. As previously listed as per the history.  2. Other diagnoses include postoperative acute blood loss anemia.      Rowe Clack, P.A.-C.     Sherryll Burger  D:  09/06/2006  T:  09/06/2006  Job:  [id number]

## 2010-12-17 NOTE — H&P (Signed)
Sandra Ferguson, Sandra Ferguson               ACCOUNT NO.:  [id number]   MEDICAL RECORD NO.:  [id number]          PATIENT TYPE:  INP   LOCATION:  NA                           FACILITY:  [facility]   PHYSICIAN:  Ines Bloomer, M.D. DATE OF BIRTH:  09-12-1926   DATE OF ADMISSION:  09/01/2006  DATE OF DISCHARGE:                              HISTORY & PHYSICAL   CHIEF COMPLAINT:  Right lung mass.   HISTORY OF PRESENT ILLNESS:  This 77 year old Caucasian female had a  history of a cough and chest pain and was treated for bronchitis.  She  had a chest x-ray that showed a right lower lobe lesion.  A CT scan  showed a right lower lobe lesion of 1 cm in nature.  She had a PET scan  that showed a 3.3 uptake, consistent with a probable cancer, no evidence  of mediastinal nodes or subcarinal or hilar nodal uptake.  She quit  smoking 15 years ago.  She has had intermittent wheezes but denies any  hemoptysis, fevers or chills.  Her pulmonary function tests showed an  FVC of 2.1 with an FEV1 of 1.66.   PAST MEDICAL HISTORY:  She has had a recent colonoscopy.  She has  osteopenia, a partial right bundle branch block, and a history of  gastritis.  She also has been treated by Courtney Paris, M.D., for  her bladder, which is interstitial cystitis.   ALLERGIES:  Unable to tolerate statins.   MEDICATIONS:  Fish oil, calcium and baby aspirin.   FAMILY HISTORY:  Negative for vascular disease but is positive for lung  cancer and breast cancer.   SOCIAL HISTORY:  She is married, has one daughter.  Her husband recently  had neck surgery.  She does not drink alcohol on a regular basis and has  quit smoking 15 years ago.   REVIEW OF SYSTEMS:  Weight is stable.  CARDIAC:  No angina or atrial  fibrillation.  PULMONARY:  See history of present illness.  GI:  No  nausea, vomiting, constipation or diarrhea.  GU:  Frequent cystitis.  No  other kidney disease.  VASCULAR:  No claudication, DVT or TIAs.  NEUROLOGIC:   No headaches, blackout or seizures.  MUSCULOSKELETAL:  No  joint pains or arthritis.  PSYCHIATRIC:  She has been treated for  nervousness.  EYE/ENT:  No change in her eyesight or hearing.  HEMATOLOGIC:  No problems with bleeding or anemia.   PHYSICAL EXAMINATION:  VITAL SIGNS:  Blood pressure 131/70, pulse was  94, respirations were 18, and saturations were 98%.  HEENT:  Head is atraumatic.  Eyes:  Pupils equal, round and reactive to  light and accommodation, extraocular movements are normal.  Ears:  Tympanic membranes are intact.  Nose:  There is no septal deviation.  Throat is without lesion.  NECK:  Supple without thyromegaly.  LYMPHATIC:  No supraclavicular or axillary adenopathy.  CHEST:  Clear to percussion and auscultation.  HEART:  Regular sinus rhythm, no murmurs.  ABDOMEN:  Soft.  There is no hepatosplenomegaly.  Bowel sounds are  normal.  EXTREMITIES:  Pulses are 2+.  There is no clubbing or edema.  NEUROLOGIC:  She is oriented x3.  Sensory and motor intact.  Cranial  nerves are intact.  SKIN:  Without lesion.   IMPRESSION:  1. Right lower lobe or right middle lobe lesion.  2. History of tobacco abuse.  3. History of cystitis.   PLAN:  Right VATS and right middle or right lower lobectomy.      Ines Bloomer, M.D.  Electronically Signed     DPB/MEDQ  D:  08/31/2006  T:  09/01/2006  Job:  [id number]

## 2010-12-17 NOTE — Assessment & Plan Note (Signed)
[facility]                             PULMONARY OFFICE NOTE   ROSARIA, KUBIN                      MRN:          [id number]  DATE:10/30/2006                            DOB:          1926/12/18    CHIEF COMPLAINT:  Evaluate dyspnea.   This is a 77 year old female who has had diagnosis of lung cancer in the  fall of last year with right middle lobe and upper lobe nodule, positive  for non-small cell carcinoma.  She underwent right upper and middle  lobectomy two months ago, and now has had several cycles of chemotherapy  to include VP-16 and carboplatin.  Her last round was on October 03, 2006,  and she received Neulasta 6 mg on October 06, 2006.  Sometime after that,  she became quite ill with shortness of breath, wheezing, cough, and  diffuse aches and pains.  She has had increased dyspnea since that  episode and she is referred for further evaluation.  She smoked two and  a half packs a day for 45 years, quit smoking in 1992.  Had a distant  history of asthma in the past.  She is short of breath with activity,  not at rest.  She has no cough currently.  No chest pain, acid  indigestion, heartburn, or loss of appetite.  She does have some  abdominal pain.  She does note some sore throat after the Neulasta  injection.  She is referred for further evaluation.   PAST MEDICAL HISTORY:  No history of hypertension, heart disease, asthma  that is known previously.  No history of emphysema, diabetes.  She does  have hypercholesterolemia and chronic allergies.   OPERATIVE HISTORY:  1. Cholecystectomy in 1998.  2. Lung surgery as noted above in February 2008.   CURRENT MEDICATION ALLERGIES:  None.   CURRENT MEDICATION ALLERGIES:  1. Alprazolam 0.5 mg nightly.  2. Zyrtec 10 mg daily.   SOCIAL HISTORY:  Smoking history as noted above.   FAMILY HISTORY:  Asthma in father.  Mother had breast cancer.  Maternal  grandmother had colon cancer.  Sister had  colon cancer.  The patient  lives at home with her husband.  She is retired.   REVIEW OF SYSTEMS:  Otherwise noncontributory.   PHYSICAL EXAMINATION:  GENERAL:  This is an obese female in no distress.  VITAL SIGNS:  Temperature 98.  Blood pressure 102/66.  Pulse 105.  Saturation was 96 percent on room air.  CHEST:  Distant breath sounds with prolonged expiratory phase, a few  expired wheezes.  CARDIAC:  Regular rate and rhythm without S3.  Normal S1, S2.  No  murmur, rub, heave or gallop.  ABDOMEN:  Soft, nontender.  EXTREMITIES:  No edema, clubbing, or venous disease.  SKIN:  Clear.  NEUROLOGIC:  Exam was intact.  HEENT:  No jugular venous distention, no lymphadenopathy.  Oropharynx  clear.  Neck supple.   LABORATORY DATA:  Spirometry showed obstructive defect.  FEV-1 of 1.43.  FVC of 2.38.  FEV-1/FVC ratio of 60 percent predicted compatible with  mild obstructive defect.  CT scan of the chest from March 2008 showed  previous right upper and middle lobectomy with no active process.   IMPRESSION:  Chronic air flow obstruction due to likely asthmatic  bronchitic flare.  Also, question reaction to Neulasta.   RECOMMENDATIONS:  Would hold further Neulasta in this patient and begin  Symbicort 80/4.5 two sprays b.i.d.  The patient was instructed as to its  proper use.  We will see the patient back for followup in one month.     Charlcie Cradle Delford Field, MD, [facility]  Electronically Signed    PEW/MedQ  DD: 10/30/2006  DT: 10/31/2006  Job #: [id number]   cc:   Samul Dada, M.D.

## 2010-12-17 NOTE — Procedures (Signed)
[facility]  Patient:    Sandra Ferguson, Sandra Ferguson                        MRN: [id number] Proc. Date: 08/14/00 Attending:  Sabino Gasser, M.D.                           Procedure Report  PROCEDURE:  Colonoscopy.  INDICATIONS:  Colon polyps.  ANESTHESIA:  Demerol 80 mg, Versed 8 mg.  DESCRIPTION OF PROCEDURE:  With the patient mildly sedated in the left lateral decubitus position, the Olympus videoscopic colonoscope was inserted into the rectum and passed under direct vision to the cecum.  The cecum identified by the ileocecal valve and appendiceal orifice.  Of note, there were diverticula seen throughout the colon, including the cecum.  From this point, the colonoscope was slowly withdrawn taking circumferential views of the entire colonic mucosa until we reached the rectum which appeared normal under direct view and showed internal hemorrhoids in retroflexed view.  The endoscope was straightened and withdrawn.  The patients vital signs and pulse oximeter remained stable.  The patient tolerated the procedure well without apparent complications.  FINDINGS:  Diverticulosis of the colon.  Internal hemorrhoids.  Otherwise unremarkable colonoscopic examination to the cecum.  PLAN:  Repeat examination in three years. DD:  08/14/00 TD:  08/14/00 Job: [id number] ON/[id number]

## 2010-12-17 NOTE — Op Note (Signed)
NAME:  Sandra Ferguson, Sandra Ferguson                         ACCOUNT NO.:  [id number]   MEDICAL RECORD NO.:  [id number]                   PATIENT TYPE:  AMB   LOCATION:  ENDO                                 [facility]ACILITY:  [facility]   PHYSICIAN:  Georgiana Spinner, M.D.                 DATE OF BIRTH:  [birth year]   DATE OF PROCEDURE:  DATE OF DISCHARGE:                                 OPERATIVE REPORT   PROCEDURE:  Colonoscopy.   INDICATIONS:  Colon polyps.   ANESTHESIA:  Demerol 70 mg, Versed 7 mg.   DESCRIPTION OF PROCEDURE:  With the patient mildly sedated and in the left  lateral decubitus position, the Olympus videoscopic colonoscope was inserted  in the rectum and passed under direct vision to the cecum, identified by  ileocecal valve and appendiceal orifice, both of which were photographed.  From this point,  the colonoscope was slowly withdrawn, taking  circumferential views of the colonic mucosa, stopping only in the rectum  which appeared normal on direct and showed hemorrhoids on retroflexed view.  The endoscope was straightened and withdrawn.  The patient's vital signs and  pulse oximetry remained stable.  The patient tolerated the procedure well  without apparent complications.   FINDINGS:  1. Diffuse diverticulosis, right and left colon  2. Internal hemorrhoids.  3. Otherwise unremarkable exam.   PLAN:  Consider repeat examination in five years.                                               Georgiana Spinner, M.D.    GMO/MEDQ  D:  12/03/2003  T:  12/03/2003  Job:  [id number]

## 2010-12-17 NOTE — Op Note (Signed)
[name], Sandra Ferguson               ACCOUNT NO.:  [id number]   MEDICAL RECORD NO.:  [id number]          PATIENT TYPE:  INP   LOCATION:  3309                         FACILITY:  [facility]   PHYSICIAN:  Ines Bloomer, M.D. DATE OF BIRTH:  [birth year]   DATE OF PROCEDURE:  09/01/2006  DATE OF DISCHARGE:                               OPERATIVE REPORT   PREOPERATIVE DIAGNOSIS:  Right middle lobe nodule.   POSTOPERATIVE DIAGNOSIS:  Questionable atypical carcinoid, right upper  lobe.   OPERATION PERFORMED:  Right video-assisted thoracoscopic surgery, right  upper lobe wedge resection of right upper lobe lesion with right video-  assisted thoracoscopic surgical lobectomy and no dissection.   SURGEON:  Ines Bloomer, M.D.   ANESTHESIA:  General anesthesia.   DESCRIPTION OF PROCEDURE:  After percutaneous insertion of all  monitoring lines, the patient under general anesthesia was prepped and  draped in the usual sterile manner.  Two trocar sites were made in the  anterior and posterior axillary lines in the seventh intercostal space.  A 0-degree scope was inserted and no lesion could be seen.  An anterior  thoracotomy incision was made approximately 6 to 7 cm over the fifth  intercostal space, splitting the serratus.  The lesion was palpated, but  instead of being in the right middle lobe as noted on x-ray, it was in  the anterior segment of the right lower lobe and deep in the right lower  lobe.  We decided to try to do a wedge resection of this, and partially  diminutive the fissure with the EZ45 stapler through the access  incision, and then coming in from a posterior trocar site, we completed  the wedge.  Frozen section revealed carcinoid, but it was thought that  possibly it was an atypical carcinoid, and the staple line was very  tight.  We could not get a larger wedge because it had been so deep, so  we decided to do a completion lobectomy under visualization with the  scope, and  we dissected out the superior pulmonary vein and then divided  it with the Autosuture 30 white Roticulator.  Then, dissection was  carried superiorly, dissected out the posterior branches.  There was a  large 10R node, which was dissected, prior to dividing these with the  Autosuture 30 white Roticulator.  The patient had a large anterior  branch, and this was dissected up, looped with the appearance, and we  also divided that with an Autosuture white Roticulator.  Several more  10R nodes were dissected free from around the bronchus.  The bronchus  was looped and stapled with the Autosuture green Roticulator.  We then  clipped a small posterior branch and divided the fissure with the EZ45  stapler and removed the right upper lobe in a specimen bag.  Two chest  tubes were brought in through the trocar sites.  A Marcaine block was  done in the usual surgical fashion.  A single On-Q was inserted above  the ribs.  The  chest was closed with 3 paracostals, drilling through the fifth rib and  going  around the superior portion of the fourth rib, #1 Vicryl in the  muscle layer, 2-0 Vicryl in the subcutaneous tissue.  CoSeal had been  applied to the staple line.  The patient returned to the recovery room  in stable condition.      Ines Bloomer, M.D.  Electronically Signed     DPB/MEDQ  D:  09/01/2006  T:  09/01/2006  Job:  [id number]   cc:   Dorita Sciara, MD

## 2011-03-23 ENCOUNTER — Encounter (INDEPENDENT_AMBULATORY_CARE_PROVIDER_SITE_OTHER): Payer: Medicare Other | Admitting: Ophthalmology

## 2011-03-23 DIAGNOSIS — H353290 Exudative age-related macular degeneration, unspecified eye, stage unspecified: Secondary | ICD-10-CM

## 2011-03-23 DIAGNOSIS — H2510 Age-related nuclear cataract, unspecified eye: Secondary | ICD-10-CM

## 2011-03-23 DIAGNOSIS — H43819 Vitreous degeneration, unspecified eye: Secondary | ICD-10-CM

## 2011-03-23 DIAGNOSIS — H3530 Unspecified macular degeneration: Secondary | ICD-10-CM

## 2011-04-11 ENCOUNTER — Encounter (INDEPENDENT_AMBULATORY_CARE_PROVIDER_SITE_OTHER): Payer: PRIVATE HEALTH INSURANCE | Admitting: Ophthalmology

## 2011-04-27 ENCOUNTER — Encounter (INDEPENDENT_AMBULATORY_CARE_PROVIDER_SITE_OTHER): Payer: Medicare Other | Admitting: Ophthalmology

## 2011-04-27 DIAGNOSIS — H3530 Unspecified macular degeneration: Secondary | ICD-10-CM

## 2011-04-27 DIAGNOSIS — H43819 Vitreous degeneration, unspecified eye: Secondary | ICD-10-CM

## 2011-04-27 DIAGNOSIS — H353290 Exudative age-related macular degeneration, unspecified eye, stage unspecified: Secondary | ICD-10-CM

## 2011-04-27 DIAGNOSIS — H2510 Age-related nuclear cataract, unspecified eye: Secondary | ICD-10-CM

## 2011-05-04 ENCOUNTER — Encounter (INDEPENDENT_AMBULATORY_CARE_PROVIDER_SITE_OTHER): Payer: Medicare Other | Admitting: Ophthalmology

## 2011-05-09 ENCOUNTER — Encounter (INDEPENDENT_AMBULATORY_CARE_PROVIDER_SITE_OTHER): Payer: Medicare Other | Admitting: Ophthalmology

## 2011-05-16 ENCOUNTER — Encounter (INDEPENDENT_AMBULATORY_CARE_PROVIDER_SITE_OTHER): Payer: Medicare Other | Admitting: Ophthalmology

## 2011-06-03 ENCOUNTER — Encounter (HOSPITAL_COMMUNITY): Payer: Self-pay | Admitting: Oncology

## 2011-06-29 ENCOUNTER — Encounter (INDEPENDENT_AMBULATORY_CARE_PROVIDER_SITE_OTHER): Payer: Medicare Other | Admitting: Ophthalmology

## 2011-06-29 DIAGNOSIS — H3530 Unspecified macular degeneration: Secondary | ICD-10-CM

## 2011-06-29 DIAGNOSIS — H353290 Exudative age-related macular degeneration, unspecified eye, stage unspecified: Secondary | ICD-10-CM

## 2011-06-29 DIAGNOSIS — H43819 Vitreous degeneration, unspecified eye: Secondary | ICD-10-CM

## 2011-06-29 DIAGNOSIS — H2510 Age-related nuclear cataract, unspecified eye: Secondary | ICD-10-CM

## 2011-07-01 ENCOUNTER — Telehealth: Payer: Self-pay | Admitting: Oncology

## 2011-07-01 NOTE — Telephone Encounter (Signed)
Returned pt's call and gv her appt for 1/24 @ 1:30 pm. appt was cx'd due to [facility] conversion.

## 2011-08-01 ENCOUNTER — Encounter (INDEPENDENT_AMBULATORY_CARE_PROVIDER_SITE_OTHER): Payer: Medicare Other | Admitting: Ophthalmology

## 2011-08-01 DIAGNOSIS — H3530 Unspecified macular degeneration: Secondary | ICD-10-CM

## 2011-08-01 DIAGNOSIS — H43819 Vitreous degeneration, unspecified eye: Secondary | ICD-10-CM

## 2011-08-01 DIAGNOSIS — H353290 Exudative age-related macular degeneration, unspecified eye, stage unspecified: Secondary | ICD-10-CM

## 2011-08-24 ENCOUNTER — Encounter: Payer: Self-pay | Admitting: Medical Oncology

## 2011-08-25 ENCOUNTER — Encounter: Payer: Self-pay | Admitting: Oncology

## 2011-08-25 ENCOUNTER — Ambulatory Visit (HOSPITAL_BASED_OUTPATIENT_CLINIC_OR_DEPARTMENT_OTHER): Payer: Medicare Other | Admitting: Oncology

## 2011-08-25 ENCOUNTER — Other Ambulatory Visit: Payer: Medicare Other | Admitting: Lab

## 2011-08-25 VITALS — BP 126/71 | HR 82 | Temp 97.5°F | Ht 64.5 in | Wt 169.6 lb

## 2011-08-25 DIAGNOSIS — M1710 Unilateral primary osteoarthritis, unspecified knee: Secondary | ICD-10-CM

## 2011-08-25 DIAGNOSIS — C3490 Malignant neoplasm of unspecified part of unspecified bronchus or lung: Secondary | ICD-10-CM | POA: Insufficient documentation

## 2011-08-25 DIAGNOSIS — M810 Age-related osteoporosis without current pathological fracture: Secondary | ICD-10-CM

## 2011-08-25 LAB — CBC WITH DIFFERENTIAL/PLATELET
BASO%: 0.3 % (ref 0.0–2.0)
HCT: 38.7 % (ref 34.8–46.6)
MCHC: 34.5 g/dL (ref 31.5–36.0)
MONO#: 0.4 10*3/uL (ref 0.1–0.9)
NEUT#: 3.1 10*3/uL (ref 1.5–6.5)
NEUT%: 59.7 % (ref 38.4–76.8)
RBC: 4.2 10*6/uL (ref 3.70–5.45)
WBC: 5.2 10*3/uL (ref 3.9–10.3)
lymph#: 1.5 10*3/uL (ref 0.9–3.3)

## 2011-08-25 LAB — COMPREHENSIVE METABOLIC PANEL
Alkaline Phosphatase: 52 U/L (ref 39–117)
BUN: 11 mg/dL (ref 6–23)
CO2: 28 mEq/L (ref 19–32)
Creatinine, Ser: 0.68 mg/dL (ref 0.50–1.10)
Glucose, Bld: 108 mg/dL — ABNORMAL HIGH (ref 70–99)
Sodium: 139 mEq/L (ref 135–145)
Total Bilirubin: 0.3 mg/dL (ref 0.3–1.2)

## 2011-08-25 LAB — LACTATE DEHYDROGENASE: LDH: 126 U/L (ref 94–250)

## 2011-08-25 NOTE — Progress Notes (Signed)
This office note has been dictated.  #[id number]

## 2011-08-25 NOTE — Progress Notes (Signed)
CC:   Ines Bloomer, M.D. Georgianne Fick, M.D.  PROBLEM LIST: 1. Small cell carcinoma of the right upper lobe status post right     upper lobectomy on 09/01/2006 by Dr. Algis Downs. Karle Plumber.  All 3     lymph nodes were negative.  Primary tumor measured 1.5 cm.  This     was a stage IA tumor.  The patient received 4 cycles of adjuvant     carboplatin VP-16 in combination with Neulasta from 10/03/2006     through 12/05/2006.  She remains without evidence of disease     recurrence. 2. Degenerative joint disease with osteoarthritis involving her right     knee. 3. Osteoporosis. 4. Diffuse diverticulosis. 5. Macular degeneration diagnosed in the fall 2011 undergoing     treatment with Avastin. 6. Low vitamin D levels.  MEDICATIONS: 1. Xanax 0.25 - 0.5 mg twice daily as needed. 2. Aspirin 81 mg daily. 3. Calcium and vitamin D one tab twice daily. 4. Zyrtec 10 mg daily. 5. Ergocalciferol 1000 units daily. 6. Glucosamine chondroitin complex 1 tablet daily. 7. Omega-3 fatty acid 3000 mg daily.  HISTORY:  Ms. Shere was last seen by [place] on 12/09/2010.  She is here today with her daughter, [name].  The patient lives by herself in [place]. She does not drive.  She has assistance from her family.  All things considered, she is doing extremely well.  She is without any complaints to suggest recurrence of her lung cancer.  She does have some burning involving the scar particularly when there is pressure in this area. She uses a cane in her right hand.  She is not falling.  She has some mild aches and pains but no major complaints.  There has been no change in her condition over the past 6 or 7 months since she was last seen here.  PHYSICAL EXAM:  She looks well.  She will be 78 years old on February 11[birth year].  Weight today is 169 pounds, 9.6 ounces up from 162 pounds 6.4 ounces on 12/09/2010.  Height 5 feet 4-1/2 inches body surface area 1.87 sq m.  Blood pressure 126/71.  Other vital signs are  normal.  She wears glasses.  There is no scleral icterus.  Mouth and pharynx are benign. No peripheral adenopathy palpable.  Heart and lungs are normal.  No evidence for chest wall recurrence.  No central catheter or port. Abdomen:  Benign with no organomegaly or masses palpable.  Extremities: No peripheral edema or clubbing.  Neurologic exam:  Grossly normal.  She holds the cane in her right hand.  O2 saturations in the past have been in the mid to high 90s.  LABORATORY DATA:  Today, white count 5.2, ANC 3.1, hemoglobin 13.3, hematocrit 38.7, platelets 211,000.  Chemistries:  Today are pending. Chemistries from 12/09/2010:  Were notable for sodium of 132, BUN 25, creatinine 0.72, glucose 115.  Liver function tests were normal.  IMAGING STUDIES: 1. CT scan of the chest with IV contrast on 01/08/2009 showed no     significant changes compared with the prior exam of 11/29/2007.     There were some tiny nodules in both lungs that were unchanged. 2. Chest x-ray from 08/04/2009 showed no abnormalities or evidence for     metastatic disease. 3. A CT scan of the chest with IV contrast from 12/08/2009 showed no     evidence for metastatic disease. 4. Two-view chest x-ray from 12/09/2010 showed no active  cardiopulmonary disease. 5. Bilateral digital screening mammograms from 01/10/2011 carried out     at [facility] concluded that the right breast     demonstrated no abnormalities.  Additional imaging of the left     breast was recommended and was subsequently negative.  IMPRESSION AND PLAN:  Ms. Bacchi is now out 5 years from the time of diagnosis and resection of her small cell lung cancer, status post adjuvant chemotherapy.  There is no evidence of disease.  She continues to do well.  We will plan to see her again in 6 months at which time we will check CBC, chemistries, and a chest x-ray.  Thereafter, we may see Ms. Bouska on a yearly  basis.    ______________________________ Samul Dada, M.D. DSM/MEDQ  D:  08/25/2011  T:  08/25/2011  Job:  [id number]

## 2011-08-31 ENCOUNTER — Encounter (INDEPENDENT_AMBULATORY_CARE_PROVIDER_SITE_OTHER): Payer: Medicare Other | Admitting: Ophthalmology

## 2011-08-31 DIAGNOSIS — H353290 Exudative age-related macular degeneration, unspecified eye, stage unspecified: Secondary | ICD-10-CM

## 2011-08-31 DIAGNOSIS — H43819 Vitreous degeneration, unspecified eye: Secondary | ICD-10-CM

## 2011-08-31 DIAGNOSIS — H3530 Unspecified macular degeneration: Secondary | ICD-10-CM

## 2011-08-31 DIAGNOSIS — H2510 Age-related nuclear cataract, unspecified eye: Secondary | ICD-10-CM

## 2011-09-01 ENCOUNTER — Encounter: Payer: Self-pay | Admitting: Oncology

## 2011-09-26 ENCOUNTER — Encounter (INDEPENDENT_AMBULATORY_CARE_PROVIDER_SITE_OTHER): Payer: Medicare Other | Admitting: Ophthalmology

## 2011-10-03 ENCOUNTER — Encounter (INDEPENDENT_AMBULATORY_CARE_PROVIDER_SITE_OTHER): Payer: Medicare Other | Admitting: Ophthalmology

## 2011-10-24 ENCOUNTER — Encounter (INDEPENDENT_AMBULATORY_CARE_PROVIDER_SITE_OTHER): Payer: Medicare Other | Admitting: Ophthalmology

## 2011-10-24 DIAGNOSIS — H353290 Exudative age-related macular degeneration, unspecified eye, stage unspecified: Secondary | ICD-10-CM

## 2011-10-24 DIAGNOSIS — H43819 Vitreous degeneration, unspecified eye: Secondary | ICD-10-CM

## 2011-10-24 DIAGNOSIS — H3530 Unspecified macular degeneration: Secondary | ICD-10-CM

## 2011-11-01 ENCOUNTER — Other Ambulatory Visit: Payer: Self-pay | Admitting: Internal Medicine

## 2011-11-01 DIAGNOSIS — R270 Ataxia, unspecified: Secondary | ICD-10-CM

## 2011-11-04 ENCOUNTER — Other Ambulatory Visit: Payer: Medicare Other

## 2011-11-07 ENCOUNTER — Ambulatory Visit
Admission: RE | Admit: 2011-11-07 | Discharge: 2011-11-07 | Disposition: A | Discharge status: Home / Self Care | Payer: Medicare Other | Source: Ambulatory Visit | Attending: Internal Medicine | Admitting: Internal Medicine

## 2011-11-07 DIAGNOSIS — R270 Ataxia, unspecified: Secondary | ICD-10-CM

## 2011-11-21 ENCOUNTER — Encounter (INDEPENDENT_AMBULATORY_CARE_PROVIDER_SITE_OTHER): Payer: Medicare Other | Admitting: Ophthalmology

## 2011-11-21 DIAGNOSIS — H353290 Exudative age-related macular degeneration, unspecified eye, stage unspecified: Secondary | ICD-10-CM

## 2011-11-21 DIAGNOSIS — I10 Essential (primary) hypertension: Secondary | ICD-10-CM

## 2011-11-21 DIAGNOSIS — H43819 Vitreous degeneration, unspecified eye: Secondary | ICD-10-CM

## 2011-11-21 DIAGNOSIS — H3530 Unspecified macular degeneration: Secondary | ICD-10-CM

## 2011-11-21 DIAGNOSIS — H35039 Hypertensive retinopathy, unspecified eye: Secondary | ICD-10-CM

## 2011-11-25 ENCOUNTER — Other Ambulatory Visit: Payer: Self-pay | Admitting: Sports Medicine

## 2011-11-25 DIAGNOSIS — M25551 Pain in right hip: Secondary | ICD-10-CM

## 2011-12-01 ENCOUNTER — Ambulatory Visit
Admission: RE | Admit: 2011-12-01 | Discharge: 2011-12-01 | Disposition: A | Discharge status: Home / Self Care | Payer: Medicare Other | Source: Ambulatory Visit | Attending: Sports Medicine | Admitting: Sports Medicine

## 2011-12-01 DIAGNOSIS — M25551 Pain in right hip: Secondary | ICD-10-CM

## 2011-12-01 MED ORDER — GADOBENATE DIMEGLUMINE 529 MG/ML IV SOLN
16.0000 mL | Freq: Once | INTRAVENOUS | Status: AC | PRN
  Administered 2011-12-01: 16 mL via INTRAVENOUS

## 2012-01-02 ENCOUNTER — Encounter (INDEPENDENT_AMBULATORY_CARE_PROVIDER_SITE_OTHER): Payer: Medicare Other | Admitting: Ophthalmology

## 2012-01-02 DIAGNOSIS — H3530 Unspecified macular degeneration: Secondary | ICD-10-CM

## 2012-01-02 DIAGNOSIS — H353290 Exudative age-related macular degeneration, unspecified eye, stage unspecified: Secondary | ICD-10-CM

## 2012-01-24 ENCOUNTER — Telehealth: Payer: Self-pay

## 2012-01-24 NOTE — Telephone Encounter (Signed)
Mammogram report received and forwarded to DSM. Exam was incomplete and [facility] will contact pt.

## 2012-01-30 ENCOUNTER — Encounter (INDEPENDENT_AMBULATORY_CARE_PROVIDER_SITE_OTHER): Payer: Medicare Other | Admitting: Ophthalmology

## 2012-01-30 DIAGNOSIS — H353290 Exudative age-related macular degeneration, unspecified eye, stage unspecified: Secondary | ICD-10-CM

## 2012-01-30 DIAGNOSIS — H35439 Paving stone degeneration of retina, unspecified eye: Secondary | ICD-10-CM

## 2012-01-30 DIAGNOSIS — H3530 Unspecified macular degeneration: Secondary | ICD-10-CM

## 2012-01-30 DIAGNOSIS — H43399 Other vitreous opacities, unspecified eye: Secondary | ICD-10-CM

## 2012-01-30 DIAGNOSIS — I10 Essential (primary) hypertension: Secondary | ICD-10-CM

## 2012-01-30 DIAGNOSIS — H2510 Age-related nuclear cataract, unspecified eye: Secondary | ICD-10-CM

## 2012-01-30 DIAGNOSIS — H35039 Hypertensive retinopathy, unspecified eye: Secondary | ICD-10-CM

## 2012-02-14 ENCOUNTER — Other Ambulatory Visit: Payer: Self-pay | Admitting: Nurse Practitioner

## 2012-02-15 ENCOUNTER — Telehealth: Payer: Self-pay | Admitting: *Deleted

## 2012-02-15 NOTE — Telephone Encounter (Signed)
placed patient on j.hunter 02-20-2012 starting at 1:30pm

## 2012-02-16 ENCOUNTER — Telehealth: Payer: Self-pay | Admitting: Oncology

## 2012-02-16 NOTE — Telephone Encounter (Signed)
Moved 7/22 appt to 7/29 due to [facility] will not start seeing pt's until wk of 7/29. S/w pt she is aware

## 2012-02-20 ENCOUNTER — Ambulatory Visit (HOSPITAL_COMMUNITY)
Admission: RE | Admit: 2012-02-20 | Discharge: 2012-02-20 | Disposition: A | Discharge status: Home / Self Care | Payer: Medicare Other | Source: Ambulatory Visit | Attending: Oncology | Admitting: Oncology

## 2012-02-20 ENCOUNTER — Ambulatory Visit: Payer: Medicare Other | Admitting: Family

## 2012-02-20 ENCOUNTER — Other Ambulatory Visit: Payer: Medicare Other | Admitting: Lab

## 2012-02-20 DIAGNOSIS — I700 Atherosclerosis of aorta: Secondary | ICD-10-CM | POA: Insufficient documentation

## 2012-02-20 DIAGNOSIS — C3490 Malignant neoplasm of unspecified part of unspecified bronchus or lung: Secondary | ICD-10-CM | POA: Insufficient documentation

## 2012-02-27 ENCOUNTER — Telehealth: Payer: Self-pay | Admitting: Oncology

## 2012-02-27 ENCOUNTER — Ambulatory Visit (HOSPITAL_BASED_OUTPATIENT_CLINIC_OR_DEPARTMENT_OTHER): Payer: Medicare Other | Admitting: Family

## 2012-02-27 ENCOUNTER — Encounter: Payer: Self-pay | Admitting: Family

## 2012-02-27 ENCOUNTER — Other Ambulatory Visit (HOSPITAL_BASED_OUTPATIENT_CLINIC_OR_DEPARTMENT_OTHER): Payer: Medicare Other | Admitting: Lab

## 2012-02-27 VITALS — BP 129/77 | HR 88 | Temp 97.0°F | Ht 64.5 in | Wt 170.5 lb

## 2012-02-27 DIAGNOSIS — E559 Vitamin D deficiency, unspecified: Secondary | ICD-10-CM

## 2012-02-27 DIAGNOSIS — C3490 Malignant neoplasm of unspecified part of unspecified bronchus or lung: Secondary | ICD-10-CM

## 2012-02-27 DIAGNOSIS — M949 Disorder of cartilage, unspecified: Secondary | ICD-10-CM

## 2012-02-27 DIAGNOSIS — Z85118 Personal history of other malignant neoplasm of bronchus and lung: Secondary | ICD-10-CM

## 2012-02-27 LAB — CBC WITH DIFFERENTIAL/PLATELET
EOS%: 2.3 % (ref 0.0–7.0)
Eosinophils Absolute: 0.1 10*3/uL (ref 0.0–0.5)
LYMPH%: 28.9 % (ref 14.0–49.7)
MCH: 31.9 pg (ref 25.1–34.0)
MCV: 91.4 fL (ref 79.5–101.0)
MONO%: 8.7 % (ref 0.0–14.0)
NEUT#: 2.7 10*3/uL (ref 1.5–6.5)
Platelets: 192 10*3/uL (ref 145–400)
RBC: 4.07 10*6/uL (ref 3.70–5.45)

## 2012-02-27 LAB — COMPREHENSIVE METABOLIC PANEL
Alkaline Phosphatase: 44 U/L (ref 39–117)
BUN: 8 mg/dL (ref 6–23)
Glucose, Bld: 137 mg/dL — ABNORMAL HIGH (ref 70–99)
Sodium: 134 mEq/L — ABNORMAL LOW (ref 135–145)
Total Bilirubin: 0.3 mg/dL (ref 0.3–1.2)

## 2012-02-27 NOTE — Progress Notes (Signed)
Patient ID: Sandra Ferguson, female   DOB: [birth year], 78 y.o.   MRN: [id number] CSN: [id number]  Cc:  Dr. Ines Bloomer, MD Dr. Georgianne Fick, MD  Problem List: Sandra Ferguson is a 78 y.o. female with a problem list consisting of: 1. Small cell carcinoma of the right upper lobe status post right upper lobectomy on 09/01/2006 by Dr. Algis Downs. Karle Plumber. All 3 lymph nodes were negative. Primary tumor measured 1.5 cm. This was a stage IA tumor. The patient received 4 cycles of adjuvant Carboplatin VP-16 in combination with Neulasta from 10/03/2006 through 12/05/2006. She remains without evidence of disease recurrence.  2. Degenerative joint disease with osteoarthritis involving her right knee.  3. Osteoporosis.  4. Diffuse diverticulosis.  5. Macular degeneration diagnosed in the fall 2011 undergoing treatment with Avastin.  6. Low vitamin D levels.  Past Medical History: Past Medical History  Diagnosis Date  . Lung cancer   . Osteoporosis   . Interstitial cystitis   . Diverticulosis   . Gastritis   . Phlebitis 1970  . Macular degeneration 2011    Surgical History: Past Surgical History  Procedure Date  . Right upper lobectomy 09/01/2006  . Cholecystectomy     laparoscopic    Current Medications: Current Outpatient Prescriptions  Medication Sig Dispense Refill  . acetaminophen (TYLENOL) 500 MG tablet Take 500 mg by mouth every 6 (six) hours as needed.        . ALPRAZolam (XANAX) 0.5 MG tablet Take 0.5 mg by mouth 2 (two) times daily as needed. 1/2 to 1 tab po bid for anxiety       . aspirin 325 MG tablet Take 81 mg by mouth daily.       [name] CALCIUM-VITAMIN D PO Take 1 tablet by mouth 2 (two) times daily.        . cetirizine (ZYRTEC) 10 MG tablet Take 10 mg by mouth daily.        . Ergocalciferol (DRISDOL PO) Take 1,000 Units by mouth daily.        [name] GLUCOSAMINE CHONDROITIN COMPLX PO Take 1 tablet by mouth daily.      . Omega-3 Fatty Acids (OMEGA 3 PO) Take 3,000 mg by mouth  daily.          Allergies: Allergies  Allergen Reactions  . Morphine Sulfate Shortness Of Breath    hallucinations    Family History: Family History  Problem Relation Age of Onset  . Cancer Mother   . Cancer Sister   . Cancer Maternal Grandfather     Social History: History  Substance Use Topics  . Smoking status: Former Games developer  . Smokeless tobacco: Not on file  . Alcohol Use: No    Review of Systems: General ROS:  1.  Positive for productive cough with clear phlegm x 1 month 2.  Ongoing right knee pain    Physical Exam:   Blood pressure 129/77, pulse 88, temperature 97 F (36.1 C), temperature source Oral, height 5' 4.5" (1.638 m), weight 170 lb 8 oz (77.338 kg).  General appearance: Alert, cooperative, appears stated age, well nourished, no distress Head: Normocephalic, without obvious abnormality, atraumatic Throat: Lips, mucosa, tongue and gums are normal, dentures for upper and lower dentition Neck: No adenopathy, supple, symmetrical, trachea midline, thyroid not enlarged, no tenderness Resp: Clear to auscultation bilaterally, no wheezes/rales/rhonchi Cardio: Regular rate and rhythm, S1, S2 normal, no murmur/click/rub/gallop GI: Soft, non-tender, distended, hypoactive bowel sounds, no masses, no organomegaly Extremities: Extremities normal,  atraumatic, no cyanosis or edema   Laboratory Data: Results for orders placed in visit on 02/27/12 (from the past 48 hour(s))  CBC WITH DIFFERENTIAL     Status: Normal   Collection Time   02/27/12  2:01 PM      Component Value Range Comment   WBC 4.6  3.9 - 10.3 10e3/uL    NEUT# 2.7  1.5 - 6.5 10e3/uL    HGB 13.0  11.6 - 15.9 g/dL    HCT 16.1  09.6 - 04.5 %    Platelets 192  145 - 400 10e3/uL    MCV 91.4  79.5 - 101.0 fL    MCH 31.9  25.1 - 34.0 pg    MCHC 34.9  31.5 - 36.0 g/dL    RBC 4.09  8.11 - 9.14 10e6/uL    RDW 13.4  11.2 - 14.5 %    lymph# 1.3  0.9 - 3.3 10e3/uL    MONO# 0.4  0.1 - 0.9 10e3/uL     Eosinophils Absolute 0.1  0.0 - 0.5 10e3/uL    Basophils Absolute 0.0  0.0 - 0.1 10e3/uL    NEUT% 59.1  38.4 - 76.8 %    LYMPH% 28.9  14.0 - 49.7 %    MONO% 8.7  0.0 - 14.0 %    EOS% 2.3  0.0 - 7.0 %    BASO% 1.0  0.0 - 2.0 %      Imaging Studies: 1.Chest x-ray 02/20/2012 in which the impression states: No suspicious pulmonary nodule, or acute cardiopulmonary disease by conventional radiography. 2.  Surgical changes of prior right upper lobectomy again demonstrated.   2 CT scan of the chest with IV contrast on 01/08/2009 showed no  significant changes compared with the prior exam of 11/29/2007. There were some tiny nodules in both lungs that were unchanged.  3. Chest x-ray from 08/04/2009 showed no abnormalities or evidence for metastatic disease.  4. A CT scan of the chest with IV contrast from 12/08/2009 showed no evidence for metastatic disease.  5. Two-view chest x-ray from 12/09/2010 showed no active cardiopulmonary disease.  6. Bilateral digital screening mammograms from 01/10/2011 carried out at [facility] concluded that the right breast  demonstrated no abnormalities. Additional imaging of the left breast was recommended and was subsequently negative.   Impression/Plan: Sandra Ferguson is now over 5 years from the time of diagnosis and resection of her small cell lung cancer, status post  adjuvant chemotherapy. There is no evidence of disease. She continues to do well.  The patient states that she has not had a colonoscopy in over 5 years.  Asked the patient to speak with her PCP about receiving a colonoscopy this year.  The patient stated that she had a mammogram 1 month ago with a follow-up mammogram in 6 months. Dr. Arline Asp and I spoke to the patient extensively about her health and she states that she is feeling well.  The patient has a follow-up appointment scheduled for 1 year with CBC, CMP, LDH and a chest x-ray to be completed prior to her office appointment.   The patient  understands that if she experiences any changes in her breathing or health she is to notify us immediately.   Ever Gustafson NP-C 02/27/2012, 3:51 PM

## 2012-02-27 NOTE — Patient Instructions (Signed)
Patient ID: Sandra Ferguson,   DOB: [birth year],  MRN: [id number]   [facility] Discharge Instructions  RECOMMENDATIONS MAD BY THE CONSULTANT AND ANY TEST RESULT(S) WILL BE FORWARDED TO YOU REFERRING DOCTOR   EXAM FINDINGS BY NURSE PRACTITIONER TODAY TO REPORT TO THE CLINIC OR PRIMARY PROVIDER: Please report cough if it continues for much longer.   Your Current Medications Are: Current Outpatient Prescriptions  Medication Sig Dispense Refill  . acetaminophen (TYLENOL) 500 MG tablet Take 500 mg by mouth every 6 (six) hours as needed.        . ALPRAZolam (XANAX) 0.5 MG tablet Take 0.5 mg by mouth 2 (two) times daily as needed. 1/2 to 1 tab po bid for anxiety       . aspirin 325 MG tablet Take 81 mg by mouth daily.       [name] CALCIUM-VITAMIN D PO Take 1 tablet by mouth 2 (two) times daily.        . cetirizine (ZYRTEC) 10 MG tablet Take 10 mg by mouth daily.        . Ergocalciferol (DRISDOL PO) Take 1,000 Units by mouth daily.        [name] GLUCOSAMINE CHONDROITIN COMPLX PO Take 1 tablet by mouth daily.      . Omega-3 Fatty Acids (OMEGA 3 PO) Take 3,000 mg by mouth daily.           INSTRUCTIONS GIVEN, DISCUSSED AND FOLLOW-UP: (see above)  I acknowledge that I have been informed and understand all the instructions given to me and have received a copy.  I do not have any further questions at this time, but I understand that I may call the [facility] at [phone] during business hours should I have any further questions or need assistance in obtaining follow-up care.   02/27/2012, 3:04 PM

## 2012-02-27 NOTE — Telephone Encounter (Signed)
appts made and printed for pt aom °

## 2012-03-02 ENCOUNTER — Other Ambulatory Visit: Payer: Self-pay | Admitting: Critical Care Medicine

## 2012-03-02 ENCOUNTER — Telehealth: Payer: Self-pay | Admitting: Medical Oncology

## 2012-03-02 NOTE — Telephone Encounter (Signed)
I received ad refill request for proair and do not see this on pts med list. I called pharmacy and they had a last refill of 11/02/09 , but could not confirm prescriber. I told pt to call his PCP or Dr Delford Field whom she had seen in past. Pt sadi she will follow up with PCP or pulmonolgist

## 2012-03-05 ENCOUNTER — Encounter (INDEPENDENT_AMBULATORY_CARE_PROVIDER_SITE_OTHER): Payer: Medicare Other | Admitting: Ophthalmology

## 2012-03-05 DIAGNOSIS — H2510 Age-related nuclear cataract, unspecified eye: Secondary | ICD-10-CM

## 2012-03-05 DIAGNOSIS — H43819 Vitreous degeneration, unspecified eye: Secondary | ICD-10-CM

## 2012-03-05 DIAGNOSIS — H3530 Unspecified macular degeneration: Secondary | ICD-10-CM

## 2012-03-05 DIAGNOSIS — H353290 Exudative age-related macular degeneration, unspecified eye, stage unspecified: Secondary | ICD-10-CM

## 2012-04-09 ENCOUNTER — Encounter (INDEPENDENT_AMBULATORY_CARE_PROVIDER_SITE_OTHER): Payer: Medicare Other | Admitting: Ophthalmology

## 2012-04-09 DIAGNOSIS — H353290 Exudative age-related macular degeneration, unspecified eye, stage unspecified: Secondary | ICD-10-CM

## 2012-04-09 DIAGNOSIS — H3530 Unspecified macular degeneration: Secondary | ICD-10-CM

## 2012-04-16 ENCOUNTER — Encounter (INDEPENDENT_AMBULATORY_CARE_PROVIDER_SITE_OTHER): Payer: Medicare Other | Admitting: Ophthalmology

## 2012-04-16 DIAGNOSIS — H2700 Aphakia, unspecified eye: Secondary | ICD-10-CM

## 2012-04-16 DIAGNOSIS — H2510 Age-related nuclear cataract, unspecified eye: Secondary | ICD-10-CM

## 2012-04-16 DIAGNOSIS — H353290 Exudative age-related macular degeneration, unspecified eye, stage unspecified: Secondary | ICD-10-CM

## 2012-04-16 DIAGNOSIS — H43819 Vitreous degeneration, unspecified eye: Secondary | ICD-10-CM

## 2012-05-14 ENCOUNTER — Encounter (INDEPENDENT_AMBULATORY_CARE_PROVIDER_SITE_OTHER): Payer: Medicare Other | Admitting: Ophthalmology

## 2012-05-14 DIAGNOSIS — H353290 Exudative age-related macular degeneration, unspecified eye, stage unspecified: Secondary | ICD-10-CM

## 2012-05-21 ENCOUNTER — Encounter (INDEPENDENT_AMBULATORY_CARE_PROVIDER_SITE_OTHER): Payer: Medicare Other | Admitting: Ophthalmology

## 2012-06-04 ENCOUNTER — Encounter (INDEPENDENT_AMBULATORY_CARE_PROVIDER_SITE_OTHER): Payer: Medicare Other | Admitting: Ophthalmology

## 2012-06-04 DIAGNOSIS — H43819 Vitreous degeneration, unspecified eye: Secondary | ICD-10-CM

## 2012-06-04 DIAGNOSIS — H2510 Age-related nuclear cataract, unspecified eye: Secondary | ICD-10-CM

## 2012-06-04 DIAGNOSIS — H353290 Exudative age-related macular degeneration, unspecified eye, stage unspecified: Secondary | ICD-10-CM

## 2012-06-18 ENCOUNTER — Encounter (INDEPENDENT_AMBULATORY_CARE_PROVIDER_SITE_OTHER): Payer: Medicare Other | Admitting: Ophthalmology

## 2012-06-18 DIAGNOSIS — H353290 Exudative age-related macular degeneration, unspecified eye, stage unspecified: Secondary | ICD-10-CM

## 2012-06-18 DIAGNOSIS — H2510 Age-related nuclear cataract, unspecified eye: Secondary | ICD-10-CM

## 2012-06-18 DIAGNOSIS — H43819 Vitreous degeneration, unspecified eye: Secondary | ICD-10-CM

## 2012-07-09 ENCOUNTER — Encounter (INDEPENDENT_AMBULATORY_CARE_PROVIDER_SITE_OTHER): Payer: Medicare Other | Admitting: Ophthalmology

## 2012-07-16 ENCOUNTER — Encounter (INDEPENDENT_AMBULATORY_CARE_PROVIDER_SITE_OTHER): Payer: Medicare Other | Admitting: Ophthalmology

## 2012-07-18 ENCOUNTER — Encounter (INDEPENDENT_AMBULATORY_CARE_PROVIDER_SITE_OTHER): Payer: Medicare Other | Admitting: Ophthalmology

## 2012-07-18 DIAGNOSIS — H2510 Age-related nuclear cataract, unspecified eye: Secondary | ICD-10-CM

## 2012-07-18 DIAGNOSIS — H43819 Vitreous degeneration, unspecified eye: Secondary | ICD-10-CM

## 2012-07-18 DIAGNOSIS — H353290 Exudative age-related macular degeneration, unspecified eye, stage unspecified: Secondary | ICD-10-CM

## 2012-07-20 ENCOUNTER — Encounter (INDEPENDENT_AMBULATORY_CARE_PROVIDER_SITE_OTHER): Payer: Medicare Other | Admitting: Ophthalmology

## 2012-07-23 ENCOUNTER — Encounter (INDEPENDENT_AMBULATORY_CARE_PROVIDER_SITE_OTHER): Payer: Medicare Other | Admitting: Ophthalmology

## 2012-07-23 DIAGNOSIS — H3530 Unspecified macular degeneration: Secondary | ICD-10-CM

## 2012-07-23 DIAGNOSIS — H43819 Vitreous degeneration, unspecified eye: Secondary | ICD-10-CM

## 2012-07-23 DIAGNOSIS — H2510 Age-related nuclear cataract, unspecified eye: Secondary | ICD-10-CM

## 2012-07-23 DIAGNOSIS — H353290 Exudative age-related macular degeneration, unspecified eye, stage unspecified: Secondary | ICD-10-CM

## 2012-08-09 ENCOUNTER — Other Ambulatory Visit: Payer: Self-pay | Admitting: Radiology

## 2012-08-13 ENCOUNTER — Encounter (INDEPENDENT_AMBULATORY_CARE_PROVIDER_SITE_OTHER): Payer: Self-pay | Admitting: General Surgery

## 2012-08-13 ENCOUNTER — Ambulatory Visit (INDEPENDENT_AMBULATORY_CARE_PROVIDER_SITE_OTHER): Payer: Medicare Other | Admitting: General Surgery

## 2012-08-13 ENCOUNTER — Telehealth: Payer: Self-pay

## 2012-08-13 VITALS — BP 108/64 | HR 88 | Temp 97.6°F | Resp 16 | Ht 65.0 in | Wt 170.4 lb

## 2012-08-13 DIAGNOSIS — C50912 Malignant neoplasm of unspecified site of left female breast: Secondary | ICD-10-CM

## 2012-08-13 DIAGNOSIS — C50919 Malignant neoplasm of unspecified site of unspecified female breast: Secondary | ICD-10-CM

## 2012-08-13 NOTE — Telephone Encounter (Signed)
Mammogram report received and forwarded to Dr Arline Asp

## 2012-08-13 NOTE — Patient Instructions (Signed)
We will set up needle localized lumpectomy and sentinel lymph node biopsy.  IF YOU ARE TAKING ASPIRIN, COUMADIN/WARFARIN, PLAVIX, OR OTHER BLOOD THINNER, PLEASE LET us KNOW IMMEDIATELY.  WE WILL NEED TO DISCUSS WITH THE PRESCRIBING PROVIDER IF THESE ARE SAFE TO STOP. IF THESE ARE NOT STOPPED AT THE APPROPRIATE TIME, THIS WILL RESULT IN A DELAY FOR YOUR SURGERY.  DO NOT TAKE THESE MEDICATIONS OR IBUPROFEN/NAPROXEN WITHIN A WEEK BEFORE SURGERY.   The main risks of surgery are bleeding, infection, damage to other structures, and seroma (accumulation of fluid) under the incision site(s).  You will go to [facility] before coming to the operating room for them to place a wire marker.    These complications may lead to additional procedures such as drainage of seroma/infection.  If cancer is found, you may need other surgeries to obtain negative margins or to take more lymph nodes.   Most women do accumulate fluid in the breast cavity where the specimen was removed. We do not always have to drain this fluid.  If your breast is very tense, painful, or red, then we may need to numb the skin and use a needle to aspirate the fluid.  We do provide patients with a Breast Binder.  The purpose of this is to avoid the use of tape on the sensitive tissue of the breast and to provide some compression to minimize the risk of seroma.  If the binder is uncomfortable, you may find that a tank top with a built-in shelf bra or a loose sports bra works better for you.  I recommend wearing this around the clock for the first 1-2 weeks except in the shower.    You may remove your dressings and may shower 48 hours after surgery.    Many patients have some constipation in the week after surgery due to the narcotics and anesthesia.  You may need over the counter stool softeners or laxatives if you experience difficulty having bowel movements.    If the following occur, call our office at [phone]: If you have a fever over 101  or pain that is severe despite narcotics. If you have redness or drainage at the wound. If you develop persistent nausea or vomiting.  I will follow you back up in 1-4 weeks.    Please submit any paperwork about time off work/insurance forms to the front desk.

## 2012-08-14 ENCOUNTER — Other Ambulatory Visit (INDEPENDENT_AMBULATORY_CARE_PROVIDER_SITE_OTHER): Payer: Self-pay | Admitting: General Surgery

## 2012-08-14 DIAGNOSIS — C50919 Malignant neoplasm of unspecified site of unspecified female breast: Secondary | ICD-10-CM

## 2012-08-15 DIAGNOSIS — Z853 Personal history of malignant neoplasm of breast: Secondary | ICD-10-CM | POA: Insufficient documentation

## 2012-08-15 NOTE — Progress Notes (Signed)
Chief Complaint  Patient presents with  . Advice Only    consult for br ca sx    HISTORY: Patient is an 79 year old female who presents with a new diagnosis of left breast cancer.  This was found on screening mammogram. She had a call back for a six-month assessment of some calcifications. These appeared more concerning and so biopsy was undertaken. This positive for invasive mammary carcinoma. The lesion is 4 x 9 mm. This is not palpable. She has had some prior callbacks on her mammograms but has not ever had a breast biopsy before.  Her mother and maternal grandmother both had breast cancer.  She has prior hormone use. She has not noticed any nipple retraction or discharge.  Past Medical History  Diagnosis Date  . Osteoporosis   . Interstitial cystitis   . Diverticulosis   . Gastritis   . Phlebitis 1970  . Macular degeneration 2011  . Lung cancer   . Breast cancer   . Asthma     Past Surgical History  Procedure Date  . Right upper lobectomy 09/01/2006  . Cholecystectomy     laparoscopic    Current Outpatient Prescriptions  Medication Sig Dispense Refill  . acetaminophen (TYLENOL) 500 MG tablet Take 500 mg by mouth every 6 (six) hours as needed.        . ALPRAZolam (XANAX) 0.5 MG tablet Take 0.5 mg by mouth 2 (two) times daily as needed. 1/2 to 1 tab po bid for anxiety       . CALCIUM-VITAMIN D PO Take 1 tablet by mouth 2 (two) times daily.        . cetirizine (ZYRTEC) 10 MG tablet Take 10 mg by mouth daily.        . aspirin 325 MG tablet Take 81 mg by mouth daily.       . Omega-3 Fatty Acids (OMEGA 3 PO) Take 3,000 mg by mouth daily.           Allergies  Allergen Reactions  . Morphine Sulfate Shortness Of Breath    hallucinations     Family History  Problem Relation Age of Onset  . Cancer Mother     breast  . Cancer Sister   . Cancer Maternal Grandfather      History   Social History  . Marital Status: Widowed    Spouse Name: N/A    Number of Children: N/A   . Years of Education: N/A   Social History Main Topics  . Smoking status: Former Smoker  . Smokeless tobacco: Former User  . Alcohol Use: No  . Drug Use: No  . Sexually Active: None    REVIEW OF SYSTEMS - PERTINENT POSITIVES ONLY: 12 point review of systems negative other than HPI and PMH except for incontinence and wheezing.  EXAM: Filed Vitals:   08/13/12 1453  BP: 108/64  Pulse: 88  Temp: 97.6 F (36.4 C)  Resp: 16    Gen:  No acute distress.  Well nourished and well groomed.   Neurological: Alert and oriented to person, place, and time. Coordination normal.  Head: Normocephalic and atraumatic.  Eyes: Conjunctivae are normal. Pupils are equal, round, and reactive to light. No scleral icterus.  Neck: Normal range of motion. Neck supple. No tracheal deviation or thyromegaly present.  Cardiovascular: Normal rate, regular rhythm, normal heart sounds and intact distal pulses.  Exam reveals no gallop and no friction rub.  No murmur heard. Breast:  Breasts are symmetric bilaterally.   These are ptotic in nature.  There is bruising at the prior site of biopsy. There is no nipple retraction, skin dimpling, nipple discharge, palpable mass, or palpable lymphadenopathy. Respiratory: Effort normal.  No respiratory distress. No chest wall tenderness. Breath sounds normal.  No wheezes, rales or rhonchi.  GI: Soft. Bowel sounds are normal. The abdomen is soft and nontender.  There is no rebound and no guarding.  Musculoskeletal: Normal range of motion. Extremities are nontender.  Lymphadenopathy: No cervical, preauricular, postauricular or axillary adenopathy is present Skin: Skin is warm and dry. No rash noted. No diaphoresis. No erythema. No pallor. No clubbing, cyanosis, or edema.   Psychiatric: Normal mood and affect. Behavior is normal. Judgment and thought content normal.    LABORATORY RESULTS: Available labs are reviewed  Breast, left, needle core biopsy, mass - INVASIVE MAMMARY  CARCINOMA, SEE COMMENT. - ATYPICAL DUCTAL HYPERPLASIA Microscopic Comment Although the grade of tumor is best assessed at resection, with these biopsies, the invasive carcinoma in grade II.  RADIOLOGY RESULTS: See E-Chart or I-Site for most recent results.  Images and reports are reviewed. 4x9 mm mass at 2 o'clock, 2 cm from nipple, no abnormal LN   ASSESSMENT AND PLAN: Breast cancer, left, cT1N0 +/+/-, Ki67 13% Pt with grade II small left breast cancer.  Will plan needle localized lumpectomy and sentinel lymph node mapping/biopsy.  The surgical procedure was described to the patient.  I discussed the incision type and location and that we would need radiology involved on the day of surgery with a wire marker and sentinel node.      The risks and benefits of the procedure were described to the patient and she wishes to proceed.    We discussed the risks bleeding, infection, damage to other structures, need for further procedures/surgeries.  We discussed the risk of seroma.  The patient was advised if the area in the breast in cancer, we may need to go back to surgery for additional tissue to obtain negative margins or for a lymph node biopsy. The patient was advised that these are the most common complications, but that others can occur as well.  They were advised against taking aspirin or other anti-inflammatory agents/blood thinners the week before surgery.    She sees Dr. Murinson and would like follow up with him.      Simran Mannis L Alphonsus Doyel MD Surgical Oncology, General and Endocrine Surgery [facility]      Visit Diagnoses: 1. Breast cancer, left   2. Breast cancer, left, cT1N0 +/+/-, Ki67 13%     Primary Care Physician: RAMACHANDRAN,[name], MD    

## 2012-08-15 NOTE — Assessment & Plan Note (Signed)
Pt with grade II small left breast cancer.  Will plan needle localized lumpectomy and sentinel lymph node mapping/biopsy.  The surgical procedure was described to the patient.  I discussed the incision type and location and that we would need radiology involved on the day of surgery with a wire marker and sentinel node.      The risks and benefits of the procedure were described to the patient and she wishes to proceed.    We discussed the risks bleeding, infection, damage to other structures, need for further procedures/surgeries.  We discussed the risk of seroma.  The patient was advised if the area in the breast in cancer, we may need to go back to surgery for additional tissue to obtain negative margins or for a lymph node biopsy. The patient was advised that these are the most common complications, but that others can occur as well.  They were advised against taking aspirin or other anti-inflammatory agents/blood thinners the week before surgery.

## 2012-08-16 ENCOUNTER — Encounter (HOSPITAL_BASED_OUTPATIENT_CLINIC_OR_DEPARTMENT_OTHER): Payer: Self-pay | Admitting: *Deleted

## 2012-08-16 NOTE — Progress Notes (Signed)
No labs needed Pt has hx lung ca-had lobectomy and chemo-dr murinson. No labs needed' No heart or resp problems now

## 2012-08-20 ENCOUNTER — Encounter (INDEPENDENT_AMBULATORY_CARE_PROVIDER_SITE_OTHER): Payer: Self-pay

## 2012-08-20 ENCOUNTER — Other Ambulatory Visit (INDEPENDENT_AMBULATORY_CARE_PROVIDER_SITE_OTHER): Payer: Self-pay | Admitting: General Surgery

## 2012-08-21 ENCOUNTER — Encounter (HOSPITAL_BASED_OUTPATIENT_CLINIC_OR_DEPARTMENT_OTHER): Payer: Self-pay | Admitting: *Deleted

## 2012-08-21 ENCOUNTER — Encounter (HOSPITAL_BASED_OUTPATIENT_CLINIC_OR_DEPARTMENT_OTHER)
Admission: RE | Disposition: A | Discharge status: Home / Self Care | Payer: Self-pay | Source: Ambulatory Visit | Attending: General Surgery

## 2012-08-21 ENCOUNTER — Encounter (HOSPITAL_BASED_OUTPATIENT_CLINIC_OR_DEPARTMENT_OTHER): Payer: Self-pay | Admitting: Anesthesiology

## 2012-08-21 ENCOUNTER — Ambulatory Visit (HOSPITAL_BASED_OUTPATIENT_CLINIC_OR_DEPARTMENT_OTHER)
Admission: RE | Admit: 2012-08-21 | Discharge: 2012-08-21 | Disposition: A | Discharge status: Home / Self Care | Payer: Medicare Other | Source: Ambulatory Visit | Attending: General Surgery | Admitting: General Surgery

## 2012-08-21 ENCOUNTER — Ambulatory Visit (HOSPITAL_COMMUNITY)
Admission: RE | Admit: 2012-08-21 | Discharge: 2012-08-21 | Disposition: A | Discharge status: Home / Self Care | Payer: Medicare Other | Source: Ambulatory Visit | Attending: General Surgery | Admitting: General Surgery

## 2012-08-21 ENCOUNTER — Ambulatory Visit (HOSPITAL_BASED_OUTPATIENT_CLINIC_OR_DEPARTMENT_OTHER): Payer: Medicare Other | Admitting: Anesthesiology

## 2012-08-21 DIAGNOSIS — Z85118 Personal history of other malignant neoplasm of bronchus and lung: Secondary | ICD-10-CM | POA: Insufficient documentation

## 2012-08-21 DIAGNOSIS — C50912 Malignant neoplasm of unspecified site of left female breast: Secondary | ICD-10-CM

## 2012-08-21 DIAGNOSIS — C50919 Malignant neoplasm of unspecified site of unspecified female breast: Secondary | ICD-10-CM | POA: Insufficient documentation

## 2012-08-21 DIAGNOSIS — Z803 Family history of malignant neoplasm of breast: Secondary | ICD-10-CM | POA: Insufficient documentation

## 2012-08-21 DIAGNOSIS — Z79899 Other long term (current) drug therapy: Secondary | ICD-10-CM | POA: Insufficient documentation

## 2012-08-21 DIAGNOSIS — Z7982 Long term (current) use of aspirin: Secondary | ICD-10-CM | POA: Insufficient documentation

## 2012-08-21 DIAGNOSIS — J45909 Unspecified asthma, uncomplicated: Secondary | ICD-10-CM | POA: Insufficient documentation

## 2012-08-21 HISTORY — DX: Unspecified osteoarthritis, unspecified site: M19.90

## 2012-08-21 HISTORY — DX: Shortness of breath: R06.02

## 2012-08-21 HISTORY — PX: BREAST LUMPECTOMY WITH NEEDLE LOCALIZATION AND AXILLARY SENTINEL LYMPH NODE BX: SHX5760

## 2012-08-21 HISTORY — DX: Complete loss of teeth, unspecified cause, unspecified class: K08.109

## 2012-08-21 HISTORY — DX: Presence of spectacles and contact lenses: Z97.3

## 2012-08-21 HISTORY — DX: Presence of dental prosthetic device (complete) (partial): Z97.2

## 2012-08-21 SURGERY — BREAST LUMPECTOMY WITH NEEDLE LOCALIZATION AND AXILLARY SENTINEL LYMPH NODE BX
Anesthesia: General | Site: Breast | Laterality: Left | Wound class: Clean

## 2012-08-21 MED ORDER — SODIUM CHLORIDE 0.9 % IJ SOLN: 3.0000 mL | INTRAMUSCULAR | Status: DC | PRN

## 2012-08-21 MED ORDER — FENTANYL CITRATE 0.05 MG/ML IJ SOLN
INTRAMUSCULAR | Status: DC | PRN
  Administered 2012-08-21: 25 ug via INTRAVENOUS

## 2012-08-21 MED ORDER — MIDAZOLAM HCL 2 MG/2ML IJ SOLN
1.0000 mg | INTRAMUSCULAR | Status: DC | PRN
  Administered 2012-08-21: 1 mg via INTRAVENOUS

## 2012-08-21 MED ORDER — ACETAMINOPHEN 650 MG RE SUPP: 650.0000 mg | RECTAL | Status: DC | PRN

## 2012-08-21 MED ORDER — HYDROCODONE-ACETAMINOPHEN 5-325 MG PO TABS: 1.0000 | ORAL_TABLET | Freq: Four times a day (QID) | ORAL | Status: DC | PRN

## 2012-08-21 MED ORDER — BUPIVACAINE HCL (PF) 0.25 % IJ SOLN
INTRAMUSCULAR | Status: DC | PRN
  Administered 2012-08-21: 13 mL

## 2012-08-21 MED ORDER — ONDANSETRON HCL 4 MG/2ML IJ SOLN: 4.0000 mg | Freq: Once | INTRAMUSCULAR | Status: DC | PRN

## 2012-08-21 MED ORDER — DEXAMETHASONE SODIUM PHOSPHATE 4 MG/ML IJ SOLN
INTRAMUSCULAR | Status: DC | PRN
  Administered 2012-08-21: 10 mg via INTRAVENOUS

## 2012-08-21 MED ORDER — PROPOFOL 10 MG/ML IV BOLUS
INTRAVENOUS | Status: DC | PRN
  Administered 2012-08-21: 100 mg via INTRAVENOUS

## 2012-08-21 MED ORDER — TECHNETIUM TC 99M SULFUR COLLOID FILTERED
1.0000 | Freq: Once | INTRAVENOUS | Status: AC | PRN
  Administered 2012-08-21: 1 via INTRADERMAL

## 2012-08-21 MED ORDER — SODIUM CHLORIDE 0.9 % IV SOLN: 250.0000 mL | INTRAVENOUS | Status: DC | PRN

## 2012-08-21 MED ORDER — ONDANSETRON HCL 4 MG/2ML IJ SOLN
INTRAMUSCULAR | Status: DC | PRN
  Administered 2012-08-21: 4 mg via INTRAVENOUS

## 2012-08-21 MED ORDER — SODIUM CHLORIDE 0.9 % IJ SOLN: 3.0000 mL | Freq: Two times a day (BID) | INTRAMUSCULAR | Status: DC

## 2012-08-21 MED ORDER — OXYCODONE HCL 5 MG PO TABS
5.0000 mg | ORAL_TABLET | ORAL | Status: DC | PRN
Start: 2012-08-21 — End: 2012-08-21

## 2012-08-21 MED ORDER — HYDROMORPHONE HCL PF 1 MG/ML IJ SOLN
0.2500 mg | INTRAMUSCULAR | Status: DC | PRN
  Administered 2012-08-21 (×2): 0.5 mg via INTRAVENOUS

## 2012-08-21 MED ORDER — ACETAMINOPHEN 325 MG PO TABS: 650.0000 mg | ORAL_TABLET | ORAL | Status: DC | PRN

## 2012-08-21 MED ORDER — CEFAZOLIN SODIUM-DEXTROSE 2-3 GM-% IV SOLR
2.0000 g | INTRAVENOUS | Status: AC
  Administered 2012-08-21: 2 g via INTRAVENOUS

## 2012-08-21 MED ORDER — SODIUM CHLORIDE 0.9 % IJ SOLN
INTRAMUSCULAR | Status: DC | PRN
  Administered 2012-08-21: 15:00:00

## 2012-08-21 MED ORDER — FENTANYL CITRATE 0.05 MG/ML IJ SOLN
50.0000 ug | INTRAMUSCULAR | Status: DC | PRN
  Administered 2012-08-21: 50 ug via INTRAVENOUS

## 2012-08-21 MED ORDER — LIDOCAINE HCL (CARDIAC) 20 MG/ML IV SOLN
INTRAVENOUS | Status: DC | PRN
  Administered 2012-08-21: 80 mg via INTRAVENOUS

## 2012-08-21 MED ORDER — LACTATED RINGERS IV SOLN
INTRAVENOUS | Status: DC
  Administered 2012-08-21 (×2): via INTRAVENOUS

## 2012-08-21 MED ORDER — ONDANSETRON HCL 4 MG/2ML IJ SOLN: 4.0000 mg | Freq: Four times a day (QID) | INTRAMUSCULAR | Status: DC | PRN

## 2012-08-21 SURGICAL SUPPLY — 67 items
BANDAGE GAUZE ELAST BULKY 4 IN (GAUZE/BANDAGES/DRESSINGS) ×2 IMPLANT
BINDER BREAST LRG (GAUZE/BANDAGES/DRESSINGS) IMPLANT
BINDER BREAST MEDIUM (GAUZE/BANDAGES/DRESSINGS) IMPLANT
BINDER BREAST XLRG (GAUZE/BANDAGES/DRESSINGS) ×1 IMPLANT
BINDER BREAST XXLRG (GAUZE/BANDAGES/DRESSINGS) IMPLANT
BLADE HEX COATED 2.75 (ELECTRODE) ×2 IMPLANT
BLADE SURG 10 STRL SS (BLADE) ×2 IMPLANT
BLADE SURG 15 STRL LF DISP TIS (BLADE) ×1 IMPLANT
BLADE SURG 15 STRL SS (BLADE) ×2
CANISTER SUCTION 1200CC (MISCELLANEOUS) ×2 IMPLANT
CHLORAPREP W/TINT 26ML (MISCELLANEOUS) ×2 IMPLANT
CLIP TI LARGE 6 (CLIP) ×2 IMPLANT
CLIP TI MEDIUM 6 (CLIP) ×4 IMPLANT
CLIP TI WIDE RED SMALL 6 (CLIP) IMPLANT
CLOTH BEACON ORANGE TIMEOUT ST (SAFETY) ×2 IMPLANT
COVER MAYO STAND STRL (DRAPES) ×2 IMPLANT
COVER PROBE W GEL 5X96 (DRAPES) ×2 IMPLANT
DECANTER SPIKE VIAL GLASS SM (MISCELLANEOUS) IMPLANT
DEVICE DUBIN W/COMP PLATE 8390 (MISCELLANEOUS) ×1 IMPLANT
DRAIN CHANNEL 19F RND (DRAIN) IMPLANT
DRAPE UTILITY XL STRL (DRAPES) ×2 IMPLANT
DRSG PAD ABDOMINAL 8X10 ST (GAUZE/BANDAGES/DRESSINGS) ×1 IMPLANT
DRSG TEGADERM 4X4.75 (GAUZE/BANDAGES/DRESSINGS) IMPLANT
ELECT REM PT RETURN 9FT ADLT (ELECTROSURGICAL) ×2
ELECTRODE REM PT RTRN 9FT ADLT (ELECTROSURGICAL) ×1 IMPLANT
EVACUATOR SILICONE 100CC (DRAIN) IMPLANT
GLOVE BIO SURGEON STRL SZ 6 (GLOVE) ×2 IMPLANT
GLOVE BIOGEL M 7.0 STRL (GLOVE) ×1 IMPLANT
GLOVE BIOGEL PI IND STRL 6.5 (GLOVE) ×1 IMPLANT
GLOVE BIOGEL PI IND STRL 7.0 (GLOVE) IMPLANT
GLOVE BIOGEL PI IND STRL 7.5 (GLOVE) IMPLANT
GLOVE BIOGEL PI INDICATOR 6.5 (GLOVE) ×1
GLOVE BIOGEL PI INDICATOR 7.0 (GLOVE) ×1
GLOVE BIOGEL PI INDICATOR 7.5 (GLOVE) ×1
GLOVE ECLIPSE 7.0 STRL STRAW (GLOVE) ×2 IMPLANT
GOWN PREVENTION PLUS XLARGE (GOWN DISPOSABLE) ×3 IMPLANT
GOWN PREVENTION PLUS XXLARGE (GOWN DISPOSABLE) ×2 IMPLANT
KIT MARKER MARGIN INK (KITS) ×1 IMPLANT
NDL HYPO 25X1 1.5 SAFETY (NEEDLE) ×2 IMPLANT
NDL SAFETY ECLIPSE 18X1.5 (NEEDLE) ×1 IMPLANT
NEEDLE HYPO 18GX1.5 SHARP (NEEDLE) ×2
NEEDLE HYPO 25X1 1.5 SAFETY (NEEDLE) ×4 IMPLANT
NS IRRIG 1000ML POUR BTL (IV SOLUTION) ×1 IMPLANT
PACK BASIN DAY SURGERY FS (CUSTOM PROCEDURE TRAY) ×2 IMPLANT
PACK UNIVERSAL I (CUSTOM PROCEDURE TRAY) ×2 IMPLANT
PENCIL BUTTON HOLSTER BLD 10FT (ELECTRODE) ×2 IMPLANT
PIN SAFETY STERILE (MISCELLANEOUS) IMPLANT
SLEEVE SCD COMPRESS KNEE MED (MISCELLANEOUS) ×1 IMPLANT
SPONGE GAUZE 4X4 12PLY (GAUZE/BANDAGES/DRESSINGS) IMPLANT
SPONGE LAP 18X18 X RAY DECT (DISPOSABLE) ×2 IMPLANT
SPONGE LAP 4X18 X RAY DECT (DISPOSABLE) IMPLANT
STAPLER VISISTAT 35W (STAPLE) ×2 IMPLANT
STOCKINETTE IMPERVIOUS LG (DRAPES) ×2 IMPLANT
STRIP CLOSURE SKIN 1/2X4 (GAUZE/BANDAGES/DRESSINGS) ×2 IMPLANT
SUT MON AB 4-0 PC3 18 (SUTURE) ×2 IMPLANT
SUT SILK 2 0 SH (SUTURE) IMPLANT
SUT VIC AB 2-0 SH 27 (SUTURE) ×2
SUT VIC AB 2-0 SH 27XBRD (SUTURE) IMPLANT
SUT VIC AB 3-0 SH 27 (SUTURE) ×4
SUT VIC AB 3-0 SH 27X BRD (SUTURE) ×2 IMPLANT
SYR BULB 3OZ (MISCELLANEOUS) IMPLANT
SYR CONTROL 10ML LL (SYRINGE) ×4 IMPLANT
TOWEL OR 17X24 6PK STRL BLUE (TOWEL DISPOSABLE) ×2 IMPLANT
TOWEL OR NON WOVEN STRL DISP B (DISPOSABLE) ×2 IMPLANT
TUBE CONNECTING 20X1/4 (TUBING) ×2 IMPLANT
WATER STERILE IRR 1000ML POUR (IV SOLUTION) ×1 IMPLANT
YANKAUER SUCT BULB TIP NO VENT (SUCTIONS) ×2 IMPLANT

## 2012-08-21 NOTE — Op Note (Signed)
Left Breast Lumpectomy with Sentinel Node Mapping and Biopsy Procedure Note  Indications: This patient presents with history of left breast cancer with clinically negative axillary lymph node exam.  Pre-operative Diagnosis: left breast cancer  Post-operative Diagnosis: left breast cancer  Surgeon: Almond Lint   Assistants: n/a  Anesthesia: General LMA anesthesia and Local anesthesia 0.25.% bupivacaine  ASA Class: 3  Procedure Details  The patient was seen in the Holding Room. The risks, benefits, complications, treatment options, and expected outcomes were discussed with the patient. The possibilities of reaction to medication, pulmonary aspiration, bleeding, infection, the need for additional procedures, failure to diagnose a condition, and creating a complication requiring transfusion or operation were discussed with the patient. The patient concurred with the proposed plan, giving informed consent.  The site of surgery properly noted/marked. The patient was taken to Operating Room # 8, identified as Sandra Ferguson and the procedure verified as Breast Lumpectomy and Sentinel Node Biopsy. A Time Out was held and the above information confirmed. After induction of anesthesia, the left arm, breast, and chest were prepped and draped in standard fashion.     The lumpectomy was performed by creating an oblique incision over the lower outer quadrant of the breastaround the previously placed localization guidewire.  Dissection was carried down to the pectoral fascia.  Orientation sutures were placed and the skin and pectoralis margins were inked.  Specimen radiography confirmed inclusion of the mammographic lesion.  Large clips were placed on the margins of the breast cavity.  Hemostasis was achieved with cautery.  The wound was irrigated and closed with a 3-0 Vicryl deep dermal interrupted and a 4-0 Monocryl subcuticular closure in layers.  Posterior margin is pectoralis muscle.    Using a  hand-held gamma probe, axillary sentinel nodes were identified transcutaneously.  An oblique incision was created below the axillary hairline.  Dissection was carried through the clavipectoral fascia.  2 level 2 axillary sentinel nodes were removed and submitted to pathology. The axillary incision was closed with a 3-0 Vicryl deep dermal interrupted and 4-0 Vicryl subcuticular closure in layers.     Sterile dressings were applied. At the end of the operation, all sponge, instrument, and needle counts were correct.  Findings: grossly clear surgical margins and 2 sentinel lymph nodes.  Clip in faxatron image  Estimated Blood Loss:  Minimal         Drains: n/a         Specimens: left breast lumpectomy and 2 axillary sentinel lymph nodes.  #1 hot, CPS 3500, #2 hot, cps 300                Complications:  None; patient tolerated the procedure well.         Disposition: PACU - hemodynamically stable.         Condition: stable

## 2012-08-21 NOTE — Anesthesia Procedure Notes (Addendum)
Performed by: York Grice    Procedure Name: LMA Insertion Performed by: York Grice Pre-anesthesia Checklist: Patient identified, Timeout performed, Emergency Drugs available, Suction available and Patient being monitored Patient Re-evaluated:Patient Re-evaluated prior to inductionOxygen Delivery Method: Circle system utilized Preoxygenation: Pre-oxygenation with 100% oxygen Intubation Type: IV induction Ventilation: Mask ventilation without difficulty LMA: LMA inserted LMA Size: 4.0 Number of attempts: 1 Placement Confirmation: breath sounds checked- equal and bilateral and positive ETCO2 Tube secured with: Tape Dental Injury: Teeth and Oropharynx as per pre-operative assessment

## 2012-08-21 NOTE — Interval H&P Note (Signed)
History and Physical Interval Note:  08/21/2012 2:06 PM  Sandra Ferguson  has presented today for surgery, with the diagnosis of left breast cancer  The various methods of treatment have been discussed with the patient and family. After consideration of risks, benefits and other options for treatment, the patient has consented to  Procedure(s) (LRB) with comments: BREAST LUMPECTOMY WITH NEEDLE LOCALIZATION AND AXILLARY SENTINEL LYMPH NODE BX (Left) as a surgical intervention .  The patient's history has been reviewed, patient examined, no change in status, stable for surgery.  I have reviewed the patient's chart and labs.  Questions were answered to the patient's satisfaction.     [name]

## 2012-08-21 NOTE — Addendum Note (Signed)
Addendum  created 08/21/12 1716 by Kerby Nora, MD   Modules edited:Notes Section

## 2012-08-21 NOTE — Progress Notes (Signed)
Emotional support during breast injections °

## 2012-08-21 NOTE — Progress Notes (Signed)
In Phase 2 of PACU care today the pt was in the bathroom waiting for help to use the toilet when she decided to stand on her own.  She had been asked to wait, and had been alone with the door open for less than 30 seconds.  She decided to try standing on her own with her walker and fell back onto the stool behind her.  She gently struck the back of her head. I was sitting across the hall and saw this happen.  She never lost consciousness or had vision or speech disturbance.  She continued to be very lucid and able to walk with her walker.  After observation for over 45 min, she was feeling normal and had no residual pain where she hit her head.  She never complained of pain on the back of her head.    I explained what happened to her family who understood.  I asked them to watch her tonight for any change in mental status.  I thought it was fine to take her home and she wished to go as well.  They will call Dr. Donell Beers for any questions.

## 2012-08-21 NOTE — H&P (View-Only) (Signed)
Chief Complaint  Patient presents with  . Advice Only    consult for br ca sx    HISTORY: Patient is an 79 year old female who presents with a new diagnosis of left breast cancer.  This was found on screening mammogram. She had a call back for a six-month assessment of some calcifications. These appeared more concerning and so biopsy was undertaken. This positive for invasive mammary carcinoma. The lesion is 4 x 9 mm. This is not palpable. She has had some prior callbacks on her mammograms but has not ever had a breast biopsy before.  Her mother and maternal grandmother both had breast cancer.  She has prior hormone use. She has not noticed any nipple retraction or discharge.  Past Medical History  Diagnosis Date  . Osteoporosis   . Interstitial cystitis   . Diverticulosis   . Gastritis   . Phlebitis 1970  . Macular degeneration 2011  . Lung cancer   . Breast cancer   . Asthma     Past Surgical History  Procedure Date  . Right upper lobectomy 09/01/2006  . Cholecystectomy     laparoscopic    Current Outpatient Prescriptions  Medication Sig Dispense Refill  . acetaminophen (TYLENOL) 500 MG tablet Take 500 mg by mouth every 6 (six) hours as needed.        . ALPRAZolam (XANAX) 0.5 MG tablet Take 0.5 mg by mouth 2 (two) times daily as needed. 1/2 to 1 tab po bid for anxiety       . CALCIUM-VITAMIN D PO Take 1 tablet by mouth 2 (two) times daily.        . cetirizine (ZYRTEC) 10 MG tablet Take 10 mg by mouth daily.        [name] aspirin 325 MG tablet Take 81 mg by mouth daily.       . Omega-3 Fatty Acids (OMEGA 3 PO) Take 3,000 mg by mouth daily.           Allergies  Allergen Reactions  . Morphine Sulfate Shortness Of Breath    hallucinations     Family History  Problem Relation Age of Onset  . Cancer Mother     breast  . Cancer Sister   . Cancer Maternal Grandfather      History   Social History  . Marital Status: Widowed    Spouse Name: N/A    Number of Children: N/A   . Years of Education: N/A   Social History Main Topics  . Smoking status: Former Games developer  . Smokeless tobacco: Former Neurosurgeon  . Alcohol Use: No  . Drug Use: No  . Sexually Active: None    REVIEW OF SYSTEMS - PERTINENT POSITIVES ONLY: 12 point review of systems negative other than HPI and PMH except for incontinence and wheezing.  EXAMCeasar Mons Vitals:   08/13/12 1453  BP: 108/64  Pulse: 88  Temp: 97.6 F (36.4 C)  Resp: 16    Gen:  No acute distress.  Well nourished and well groomed.   Neurological: Alert and oriented to person, place, and time. Coordination normal.  Head: Normocephalic and atraumatic.  Eyes: Conjunctivae are normal. Pupils are equal, round, and reactive to light. No scleral icterus.  Neck: Normal range of motion. Neck supple. No tracheal deviation or thyromegaly present.  Cardiovascular: Normal rate, regular rhythm, normal heart sounds and intact distal pulses.  Exam reveals no gallop and no friction rub.  No murmur heard. Breast:  Breasts are symmetric bilaterally.  These are ptotic in nature.  There is bruising at the prior site of biopsy. There is no nipple retraction, skin dimpling, nipple discharge, palpable mass, or palpable lymphadenopathy. Respiratory: Effort normal.  No respiratory distress. No chest wall tenderness. Breath sounds normal.  No wheezes, rales or rhonchi.  GI: Soft. Bowel sounds are normal. The abdomen is soft and nontender.  There is no rebound and no guarding.  Musculoskeletal: Normal range of motion. Extremities are nontender.  Lymphadenopathy: No cervical, preauricular, postauricular or axillary adenopathy is present Skin: Skin is warm and dry. No rash noted. No diaphoresis. No erythema. No pallor. No clubbing, cyanosis, or edema.   Psychiatric: Normal mood and affect. Behavior is normal. Judgment and thought content normal.    LABORATORY RESULTS: Available labs are reviewed  Breast, left, needle core biopsy, mass - INVASIVE MAMMARY  CARCINOMA, SEE COMMENT. - ATYPICAL DUCTAL HYPERPLASIA Microscopic Comment Although the grade of tumor is best assessed at resection, with these biopsies, the invasive carcinoma in grade II.  RADIOLOGY RESULTS: See E-Chart or I-Site for most recent results.  Images and reports are reviewed. 4x9 mm mass at 2 o'clock, 2 cm from nipple, no abnormal LN   ASSESSMENT AND PLAN: Breast cancer, left, cT1N0 +/+/-, Ki67 13% Pt with grade II small left breast cancer.  Will plan needle localized lumpectomy and sentinel lymph node mapping/biopsy.  The surgical procedure was described to the patient.  I discussed the incision type and location and that we would need radiology involved on the day of surgery with a wire marker and sentinel node.      The risks and benefits of the procedure were described to the patient and she wishes to proceed.    We discussed the risks bleeding, infection, damage to other structures, need for further procedures/surgeries.  We discussed the risk of seroma.  The patient was advised if the area in the breast in cancer, we may need to go back to surgery for additional tissue to obtain negative margins or for a lymph node biopsy. The patient was advised that these are the most common complications, but that others can occur as well.  They were advised against taking aspirin or other anti-inflammatory agents/blood thinners the week before surgery.    She sees Dr. Arline Asp and would like follow up with him.      Maudry Diego MD Surgical Oncology, General and Endocrine Surgery [facility]      Visit Diagnoses: 1. Breast cancer, left   2. Breast cancer, left, cT1N0 +/+/-, Ki67 13%     Primary Care Physician: Georgianne Fick, MD

## 2012-08-21 NOTE — Anesthesia Postprocedure Evaluation (Signed)
  Anesthesia Post-op Note  Patient: Sandra Ferguson Date  Procedure(s) Performed: Procedure(s) (LRB) with comments: BREAST LUMPECTOMY WITH NEEDLE LOCALIZATION AND AXILLARY SENTINEL LYMPH NODE BX (Left)  Patient Location: PACU  Anesthesia Type:General  Level of Consciousness: awake, alert  and oriented  Airway and Oxygen Therapy: Patient Spontanous Breathing and Patient connected to face mask oxygen  Post-op Pain: mild  Post-op Assessment: Post-op Vital signs reviewed  Post-op Vital Signs: Reviewed  Complications: No apparent anesthesia complications

## 2012-08-21 NOTE — Transfer of Care (Signed)
Immediate Anesthesia Transfer of Care Note  Patient: Sandra Ferguson  Procedure(s) Performed: Procedure(s) (LRB) with comments: BREAST LUMPECTOMY WITH NEEDLE LOCALIZATION AND AXILLARY SENTINEL LYMPH NODE BX (Left)  Patient Location: PACU  Anesthesia Type:General  Level of Consciousness: awake, oriented and patient cooperative  Airway & Oxygen Therapy: Patient Spontanous Breathing and Patient connected to face mask oxygen  Post-op Assessment: Report given to PACU RN, Post -op Vital signs reviewed and stable and Patient moving all extremities  Post vital signs: Reviewed and stable  Complications: No apparent anesthesia complications

## 2012-08-21 NOTE — Anesthesia Preprocedure Evaluation (Signed)
Anesthesia Evaluation  Patient identified by MRN, date of birth, ID band Patient awake    Reviewed: Allergy & Precautions, H&P , NPO status , Patient's Chart, lab work & pertinent test results  Airway Mallampati: I TM Distance: >3 FB Neck ROM: Full    Dental  (+) Upper Dentures, Lower Dentures, Edentulous Upper and Edentulous Lower   Pulmonary asthma ,  breath sounds clear to auscultation        Cardiovascular Rhythm:Regular Rate:Normal     Neuro/Psych    GI/Hepatic   Endo/Other    Renal/GU      Musculoskeletal   Abdominal   Peds  Hematology   Anesthesia Other Findings   Reproductive/Obstetrics                           Anesthesia Physical Anesthesia Plan  ASA: III  Anesthesia Plan: General   Post-op Pain Management:    Induction: Intravenous  Airway Management Planned: LMA  Additional Equipment:   Intra-op Plan:   Post-operative Plan: Extubation in OR  Informed Consent: I have reviewed the patients History and Physical, chart, labs and discussed the procedure including the risks, benefits and alternatives for the proposed anesthesia with the patient or authorized representative who has indicated his/her understanding and acceptance.   Dental advisory given  Plan Discussed with: CRNA, Anesthesiologist and Surgeon  Anesthesia Plan Comments:         Anesthesia Quick Evaluation

## 2012-08-22 ENCOUNTER — Encounter (HOSPITAL_BASED_OUTPATIENT_CLINIC_OR_DEPARTMENT_OTHER): Payer: Self-pay | Admitting: General Surgery

## 2012-08-23 ENCOUNTER — Encounter (INDEPENDENT_AMBULATORY_CARE_PROVIDER_SITE_OTHER): Payer: Self-pay

## 2012-08-24 ENCOUNTER — Telehealth (INDEPENDENT_AMBULATORY_CARE_PROVIDER_SITE_OTHER): Payer: Self-pay

## 2012-08-24 NOTE — Telephone Encounter (Signed)
Patients daughter called for path results. I paged Dr Donell Beers and read the results to her. She told me to tell her margins clear and lymph nodes negative. I relayed the message and they understood.

## 2012-08-27 ENCOUNTER — Encounter (INDEPENDENT_AMBULATORY_CARE_PROVIDER_SITE_OTHER): Payer: Medicare Other | Admitting: Ophthalmology

## 2012-08-28 ENCOUNTER — Telehealth: Payer: Self-pay | Admitting: Medical Oncology

## 2012-08-28 NOTE — Telephone Encounter (Signed)
I called pt to talk about her appointment with Dr. Arline Asp on 09/05/12. She had told Tiffany she could not come that day. I told her that Dr. Arline Asp is booked pretty tight and I am not sure we can move her to a Tues late afternoon. She states that she does not want to get radiation. She will be 79 years old 2[birth year] and she does not want to put her self thru this. She really would like to discuss with Dr. Arline Asp before going to radiation. I gave her the number to radiation to cancel her appointments and after she meets with Dr. Arline Asp

## 2012-08-28 NOTE — Telephone Encounter (Signed)
After she meet with Dr. Arline Asp we can reschedule her with radiation if this is the plan. She is going to check with her kids to see if someone can bring her on the 5th. We will try and not schedule her follow up appointments so early.

## 2012-08-31 ENCOUNTER — Telehealth (INDEPENDENT_AMBULATORY_CARE_PROVIDER_SITE_OTHER): Payer: Self-pay

## 2012-08-31 ENCOUNTER — Telehealth: Payer: Self-pay

## 2012-08-31 NOTE — Telephone Encounter (Signed)
The daughter called and put the pt on the phone.  I spoke to the pt.  She complains of discomfort at her axilla where her surgery was done.  She says she has extra fat tissue there and it rubs when she walks.  She is taking Aleve and doesn't want to take pain medicine.  She wants to know what else she can do.  I explained to her about postoperative seromas that can occur and that the body usually reabsorbs the fluid.  Sometimes they have to put a needle in if it becomes painful or inflamed.  She states she is not having pain.  She asked about Neosporin and I said not to use that.  I asked her to keep the area clean and dry and she can keep a bandage or pad there to keep it from rubbing.  Take the Aleve, try an ice pack to the area.  Call us if no better.  She appreciates the advice and wrote down our number in case she needs it.

## 2012-08-31 NOTE — Telephone Encounter (Signed)
Pt called to confirm she will be here 2/5 at 10 am.

## 2012-09-04 ENCOUNTER — Other Ambulatory Visit: Payer: Self-pay

## 2012-09-04 DIAGNOSIS — C50912 Malignant neoplasm of unspecified site of left female breast: Secondary | ICD-10-CM

## 2012-09-05 ENCOUNTER — Encounter: Payer: Self-pay | Admitting: Oncology

## 2012-09-05 ENCOUNTER — Ambulatory Visit (HOSPITAL_BASED_OUTPATIENT_CLINIC_OR_DEPARTMENT_OTHER): Payer: Medicare Other | Admitting: Oncology

## 2012-09-05 ENCOUNTER — Other Ambulatory Visit (HOSPITAL_BASED_OUTPATIENT_CLINIC_OR_DEPARTMENT_OTHER): Payer: Medicare Other | Admitting: Lab

## 2012-09-05 ENCOUNTER — Ambulatory Visit: Payer: Medicare Other | Admitting: Radiation Oncology

## 2012-09-05 ENCOUNTER — Ambulatory Visit: Payer: Medicare Other

## 2012-09-05 ENCOUNTER — Telehealth: Payer: Self-pay | Admitting: Oncology

## 2012-09-05 VITALS — BP 131/73 | HR 85 | Resp 18 | Ht 65.0 in | Wt 173.1 lb

## 2012-09-05 DIAGNOSIS — C50919 Malignant neoplasm of unspecified site of unspecified female breast: Secondary | ICD-10-CM

## 2012-09-05 DIAGNOSIS — Z803 Family history of malignant neoplasm of breast: Secondary | ICD-10-CM

## 2012-09-05 DIAGNOSIS — Z85118 Personal history of other malignant neoplasm of bronchus and lung: Secondary | ICD-10-CM

## 2012-09-05 DIAGNOSIS — Z170 Estrogen receptor positive status [ER+]: Secondary | ICD-10-CM

## 2012-09-05 DIAGNOSIS — M810 Age-related osteoporosis without current pathological fracture: Secondary | ICD-10-CM | POA: Insufficient documentation

## 2012-09-05 DIAGNOSIS — C3490 Malignant neoplasm of unspecified part of unspecified bronchus or lung: Secondary | ICD-10-CM

## 2012-09-05 DIAGNOSIS — C50912 Malignant neoplasm of unspecified site of left female breast: Secondary | ICD-10-CM

## 2012-09-05 LAB — CBC WITH DIFFERENTIAL/PLATELET
BASO%: 1 % (ref 0.0–2.0)
EOS%: 2.7 % (ref 0.0–7.0)
HCT: 36.3 % (ref 34.8–46.6)
LYMPH%: 23.8 % (ref 14.0–49.7)
MCH: 30.8 pg (ref 25.1–34.0)
MCHC: 35 g/dL (ref 31.5–36.0)
MCV: 87.9 fL (ref 79.5–101.0)
NEUT%: 65.7 % (ref 38.4–76.8)
Platelets: 191 10*3/uL (ref 145–400)

## 2012-09-05 LAB — COMPREHENSIVE METABOLIC PANEL (CC13)
ALT: 10 U/L (ref 0–55)
AST: 17 U/L (ref 5–34)
Creatinine: 0.7 mg/dL (ref 0.6–1.1)
Total Bilirubin: 0.27 mg/dL (ref 0.20–1.20)

## 2012-09-05 LAB — LACTATE DEHYDROGENASE (CC13): LDH: 151 U/L (ref 125–245)

## 2012-09-05 NOTE — Telephone Encounter (Signed)
Gave pt appt for lab and MD on 3/31, pt will see Dr. Basilio Cairo, Radiation Oncology, Genetics, pt aware to do bone Density appt @ [facility]

## 2012-09-05 NOTE — Progress Notes (Signed)
This office note has been dictated.  #[id number]

## 2012-09-06 NOTE — Progress Notes (Signed)
CC:   Sandra Ferguson, M.D. Almond Lint, MD  PROBLEM LIST:  1. 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 Algis Downs. Karle Plumber. All 3  lymph nodes were negative. Primary tumor measured 1.5 cm. This  was a stage IA tumor. The patient received 4 cycles of adjuvant  carboplatin VP-16 in combination with Neulasta from 10/03/2006  through 12/05/2006. She remains without evidence of disease  recurrence.  2. Degenerative joint disease with osteoarthritis involving her right  knee.  3. Osteoporosis.  4. Diffuse diverticulosis.  5. Macular degeneration diagnosed in the fall 2011 undergoing  treatment with Avastin.  6. Low vitamin D levels.  7. Invasive carcinoma of the left breast detected by mammogram at     [facility] on 08/06/2012.  Previous mammograms had suggested     nonspecific abnormality of the left breast.  Core needle biopsy on     08/09/2012 gave the diagnosis.  By mammogram, the lesion measured 4     x 9 mm.  Biopsy revealed that the tumor was estrogen receptor 100%,     progesterone receptor 8%, HER-2/neu was 1.11, and showed no     amplification by CISH.  Ki-67 was 13%.  The patient underwent     definitive surgery by Almond Lint, on 08/21/2012.  This consisted     of a lumpectomy and sentinel node sampling.  The tumor was 1.9 cm,     grade II/III.  Closest margin was 1 mm at the superior margin and 2     mm at the posterior margin.  Both sentinel lymph nodes were     negative.  Pathologic stage was pTIc, pN0, IA.  8. A strongly positive family history of breast cancer in the     patient's mother and maternal grandmother.  A half-sister through     the patient's father also had breast cancer.  Patient will be     referred for genetic evaluation.  MEDICATIONS:  Reviewed and recorded. Current Outpatient Prescriptions  Medication Sig Dispense Refill  . ALPRAZolam (XANAX) 0.5 MG tablet Take 0.5 mg by mouth 2 (two)  times daily as needed. 1/2 to 1 tab po bid for anxiety       . aspirin 81 MG tablet Take 81 mg by mouth daily.      [name] CALCIUM-VITAMIN D PO Take 1 tablet by mouth 2 (two) times daily.        . cetirizine (ZYRTEC) 10 MG tablet Take 10 mg by mouth daily.        . naproxen sodium (ANAPROX) 220 MG tablet Take 220 mg by mouth as needed.      . Omega-3 Fatty Acids (OMEGA 3 PO) Take 3,000 mg by mouth daily.          IMMUNIZATIONS:   The patient does not know if she has ever had a pneumonia shot.   She plans to obtain a flu shot today.   SMOKING HISTORY:  Patient smoked up to 2 packs of cigarettes a day for 40 years.  She stopped smoking in 1985.    HISTORY:  Sandra Ferguson has been followed by me since February 2008 for a small cell cancer of the right upper lobe, treated successfully with a wedge resection and 4 cycles of adjuvant chemotherapy with carboplatin and VP-16.  The patient has never had a recurrence.  She was last seen by [place] on 02/27/2012 and prior to that on  08/25/2011.  I am seeing Mrs. Sandra Ferguson today for evaluation of a new problem, specifically a newly diagnosed invasive stage IA cancer involving the left breast.  The patient had screening mammograms carried out on 08/06/2012 that showed a 4 x 9 mm lesion in the upper outer quadrant located at about the 2 o'clock position 2 cm from the nipple.  The patient had a prior mammogram on January 23, 2012 that had raised a question of a suspicious finding but ultimately was decided to be benign.  Also in going back and looking at a mammogram from 01/10/2011, the patient apparently also had special views of the left breast there were subsequently felt to be negative.  These studies were carried out at [facility].  In any event, the patient underwent ultrasound-guided biopsy on 08/09/2012 that yielded an invasive breast cancer.  Characteristics of the tumor can be found above.  The patient underwent lumpectomy and sentinel  node sampling on 08/21/2012.  As stated above, the tumor was 1.9 cm, strongly estrogen receptor positive, HER-2/neu negative.  Progesterone receptor was 8%.  There is a very close margin superiorly and posteriorly.  Two sentinel nodes were negative.  Tumor stage is T1c N0, i.e. IA.  The patient has a very strong family history, as noted above.  She took hormones for menopausal symptoms for about 1 week  The patient is here today with one of her daughters, Sandra Ferguson, who lives in [place].  The patient is recovering from her surgery.  She is really without any specific complaints that would suggest metastatic disease. She had a negative chest x-ray on 02/20/2012.  She does have some dyspnea on exertion.  Also constipation.  She has severe osteoarthritis involving her right knee.  She does carry a diagnosis of osteoporosis. She is not sure when her last bone density scan was carried out.  Aside from this, review of systems is essentially negative.  PHYSICAL EXAMINATION:  The patient looks fairly well.  She is soon to be 79 years old.  Weight is 173.1 pounds. Height 5 feet 5 inches.  Body surface area 1.9 sq. m.  Blood pressure 131/73.  Other vital signs are normal.  There is no scleral icterus.  Mouth and pharynx are benign. She has upper and lower dentures.  She has had a right eye cataract surgery.  Neck:  Without adenopathy.  Heart/lungs:  Normal.  Back:  No skeletal tenderness.  No central catheter or port.  The right breast is benign.  Left breast shows postsurgical changes.  There is some induration of the area where the patient has a curvilinear scar in the upper outer quadrant a couple of centimeters from the nipple areolar complex.  There is some flattening on that side.  The nipple was cleaved.  There is some pinkish discoloration.  There was some firmness also in the upper inner quadrant, not discrete.  There was also a rash in the tail of the left breast, suggesting a possible  hypersensitivity reaction to tape.  There was no obvious axillary adenopathy.  Abdomen: Somewhat obese.  Nontender with no organomegaly or masses palpable. Extremities:  No peripheral edema or clubbing.  There was palmar erythema.  The patient uses a cane for stability.  She denies any history of falling.  No focal findings.  LABORATORY DATA:  White count is 5.8, ANC 3.8, hemoglobin 12.7, hematocrit 36.3, platelets 191,000.  Chemistries were essentially normal except for a sodium of 134 and a glucose of 112.  BUN  was 10, creatinine 0.7, albumin 3.7, LDH 151.  IMAGING STUDIES:  1. CT scan of the chest with IV contrast on 01/08/2009 showed no  significant changes compared with the prior exam of 11/29/2007.  There were some tiny nodules in both lungs that were unchanged.  2. Chest x-ray from 08/04/2009 showed no abnormalities or evidence for  metastatic disease.  3. A CT scan of the chest with IV contrast from 12/08/2009 showed no  evidence for metastatic disease.  4. Two-view chest x-ray from 12/09/2010 showed no active  cardiopulmonary disease.  5. Bilateral digital screening mammograms from 01/10/2011 carried out  at [facility] concluded that the right breast  demonstrated no abnormalities. Additional imaging of the left  breast was recommended and was subsequently negative. 6. Unilateral left breast mammogram was carried out at [facility] on 08/06/2012. There was a 4 x 9 mm hypoechoic mass seen at the 2:00 position located 2 cm from the nipple. Biopsy was recommended. 7. Chest x-ray, 2 view, from 02/20/2012 showed surgical changes of a prior right upper lobectomy with no suspicious findings or evidence of acute cardiopulmonary disease.   IMPRESSION/PLAN:  Ms. Pla has a newly diagnosed invasive cancer of the left breast, stage IA.  She is still showing extensive changes from her surgery.  The left breast is still somewhat misshapen as a result of surgery.   There may be some hematoma formation in the area of the surgical site.  The patient needs to be referred for consideration of radiation treatments.  I am concerned about the close margin.  She will be a candidate for an aromatase inhibitor such as Arimidex 1 mg daily for the next 5 years.  The patient expressed some concerns about the expense of the drug.  She does not have Medicare Part D.  We will need to look into this for her.  If the aromatase inhibitors turn out to be too expensive for her, an alternative may be tamoxifen.  She does, however, retain her uterus and thus may need GYN followup.  Because of the striking family history, we will make a referral for genetics evaluation.  This would have implications for the patient's female children and female grandchildren.  The patient apparently has a diagnosis of osteoporosis.  I could not find a bone density scan in the EMR.  We will go ahead and schedule the patient for a bone density scan.  She does not recall when her last bone density scan was carried out.  She is on calcium and vitamin D.  She is not on a bisphosphonate, however.  We will go ahead and make those referrals.  I have asked Ms. Bruney to return in about 4 weeks.  I have not ordered any labs at that time.    ______________________________ Samul Dada, M.D. DSM/MEDQ  D:  09/05/2012  T:  09/06/2012  Job:  [id number]

## 2012-09-10 ENCOUNTER — Encounter (INDEPENDENT_AMBULATORY_CARE_PROVIDER_SITE_OTHER): Payer: Medicare Other | Admitting: Ophthalmology

## 2012-09-12 ENCOUNTER — Encounter (INDEPENDENT_AMBULATORY_CARE_PROVIDER_SITE_OTHER): Payer: Medicare Other | Admitting: Ophthalmology

## 2012-09-18 ENCOUNTER — Encounter (INDEPENDENT_AMBULATORY_CARE_PROVIDER_SITE_OTHER): Payer: Self-pay | Admitting: General Surgery

## 2012-09-18 ENCOUNTER — Ambulatory Visit (INDEPENDENT_AMBULATORY_CARE_PROVIDER_SITE_OTHER): Payer: Medicare Other | Admitting: General Surgery

## 2012-09-18 VITALS — BP 123/81 | HR 84 | Temp 97.0°F | Resp 26 | Ht 65.0 in | Wt 172.8 lb

## 2012-09-18 DIAGNOSIS — C50912 Malignant neoplasm of unspecified site of left female breast: Secondary | ICD-10-CM

## 2012-09-18 DIAGNOSIS — C50919 Malignant neoplasm of unspecified site of unspecified female breast: Secondary | ICD-10-CM

## 2012-09-18 MED ORDER — CEPHALEXIN 500 MG PO CAPS: 500.0000 mg | ORAL_CAPSULE | Freq: Four times a day (QID) | ORAL | Status: AC

## 2012-09-18 NOTE — Assessment & Plan Note (Signed)
Patient continues to have soreness. There is some very faint erythema of the left breast so I will do one week of antibiotics. I will see her back in 3 months. Dr. Arline Asp has set up a consult with radiation oncology and with genetics.  He is working on getting her set up for anti hormone treatment as well.

## 2012-09-18 NOTE — Patient Instructions (Signed)
Take antibiotic for 1 week.  Follow up with me in 3 months unless soreness persists or area becomes bright red.

## 2012-09-18 NOTE — Progress Notes (Signed)
HISTORY: Patient is now approximately 3 weeks status post breast conservation therapy. She had one margin was 1 mm and negative sentinel lymph nodes. Based on the new consensus guidelines, I do not recommend reexcision. She has continued to have some left breast soreness. She has not had any fevers or chills. She denies any redness of her breast. She denies drainage. She has seen Dr. Arline Asp who has discussed Arimedex treatment.  She is also inquiring about going back to see her eye doctor.      EXAM: General:  Alert and oriented Incision:  Thickening at scar.  Very faint erythema of inferior breast.  No significant palpable seroma   PATHOLOGY: Diagnosis 1. Breast, lumpectomy, Left - INVASIVE DUCTAL CARCINOMA, 1.9 CM. MSBR GRADE II OF III. - CLOSEST MARGIN SUPERIOR AT 0.1 CM. - POSTERIOR MARGIN 0.2 CM. 2. Lymph node, sentinel, biopsy, Left axillary #1 - ONE BENIGN LYMPH NODE (0/1). 3. Lymph node, sentinel, biopsy, Left axillary #2 - ONE BENIGN LYMPH NODE (0/1).   ASSESSMENT AND PLAN:   Breast cancer, left, cT1N0 +/+/-, Ki67 13% Patient continues to have soreness. There is some very faint erythema of the left breast so I will do one week of antibiotics. I will see her back in 3 months. Dr. Arline Asp has set up a consult with radiation oncology and with genetics.  He is working on getting her set up for anti hormone treatment as well.      Maudry Diego, MD Surgical Oncology, General & Endocrine Surgery [facility]  Georgianne Fick, MD Georgianne Fick, MD

## 2012-09-25 ENCOUNTER — Telehealth: Payer: Self-pay | Admitting: Medical Oncology

## 2012-09-25 ENCOUNTER — Other Ambulatory Visit: Payer: Self-pay | Admitting: Medical Oncology

## 2012-09-25 ENCOUNTER — Ambulatory Visit: Payer: Medicare Other | Admitting: Radiation Oncology

## 2012-09-25 ENCOUNTER — Encounter: Payer: Self-pay | Admitting: Medical Oncology

## 2012-09-25 ENCOUNTER — Ambulatory Visit: Payer: Medicare Other

## 2012-09-25 MED ORDER — ANASTROZOLE 1 MG PO TABS: 1.0000 mg | ORAL_TABLET | Freq: Every day | ORAL | Status: DC

## 2012-09-25 NOTE — Telephone Encounter (Signed)
I called back to let her know that I informed Dr. Arline Asp that he has decided not to do radiation. He wanted pt to know that if she does radiation this will decrease the chance of her having a recurrence. He stated without the radiation 30-40% chance but with radiation 4% chance. She states that she still does not want to do the radiation. Dr. Arline Asp would like to get her started on arimidex. She is in agreement to do the arimidex. She is going to continue seeing Dr. Arline Asp and has her appointment.

## 2012-09-27 ENCOUNTER — Telehealth: Payer: Self-pay

## 2012-09-27 NOTE — Telephone Encounter (Signed)
Pt called saying that Dr Huel Cote ob/gyn found a new nodule on cervix. She stated she also had known thyroid nodule.  Pt looked up information on arimidex in internet. Pt is questioning if she should continue arimidex. Dr Richardson's office called and left message asking for information about cervical nodule.

## 2012-09-28 ENCOUNTER — Telehealth: Payer: Self-pay

## 2012-09-28 NOTE — Telephone Encounter (Signed)
S/w pt that Dr Arline Asp says she needs to take arimidex or the chance of recurrance is very high. "It is as safe as anything we give". S/w pt about SE and that there is always a possibility but we will me monitoring her. Reconfirmed her next appt date and time.

## 2012-10-03 ENCOUNTER — Encounter (INDEPENDENT_AMBULATORY_CARE_PROVIDER_SITE_OTHER): Payer: Medicare Other | Admitting: Ophthalmology

## 2012-10-24 ENCOUNTER — Ambulatory Visit (HOSPITAL_COMMUNITY)
Admission: RE | Admit: 2012-10-24 | Discharge: 2012-10-24 | Disposition: A | Discharge status: Home / Self Care | Payer: Medicare Other | Source: Ambulatory Visit | Attending: Family | Admitting: Family

## 2012-10-24 DIAGNOSIS — Z09 Encounter for follow-up examination after completed treatment for conditions other than malignant neoplasm: Secondary | ICD-10-CM | POA: Insufficient documentation

## 2012-10-24 DIAGNOSIS — R059 Cough, unspecified: Secondary | ICD-10-CM | POA: Insufficient documentation

## 2012-10-24 DIAGNOSIS — R0989 Other specified symptoms and signs involving the circulatory and respiratory systems: Secondary | ICD-10-CM | POA: Insufficient documentation

## 2012-10-24 DIAGNOSIS — C50919 Malignant neoplasm of unspecified site of unspecified female breast: Secondary | ICD-10-CM | POA: Insufficient documentation

## 2012-10-24 DIAGNOSIS — R05 Cough: Secondary | ICD-10-CM | POA: Insufficient documentation

## 2012-10-24 DIAGNOSIS — C3490 Malignant neoplasm of unspecified part of unspecified bronchus or lung: Secondary | ICD-10-CM | POA: Insufficient documentation

## 2012-10-24 DIAGNOSIS — Z87891 Personal history of nicotine dependence: Secondary | ICD-10-CM | POA: Insufficient documentation

## 2012-10-26 ENCOUNTER — Other Ambulatory Visit: Payer: Self-pay | Admitting: Medical Oncology

## 2012-10-26 NOTE — Progress Notes (Signed)
Quick Note:  Please notify patient and call/fax these results to patient's doctors. ______ 

## 2012-10-29 ENCOUNTER — Other Ambulatory Visit: Payer: Medicare Other | Admitting: Lab

## 2012-10-29 ENCOUNTER — Encounter: Payer: Self-pay | Admitting: Oncology

## 2012-10-29 ENCOUNTER — Ambulatory Visit (HOSPITAL_BASED_OUTPATIENT_CLINIC_OR_DEPARTMENT_OTHER): Payer: Medicare Other | Admitting: Oncology

## 2012-10-29 ENCOUNTER — Other Ambulatory Visit: Payer: Self-pay | Admitting: Medical Oncology

## 2012-10-29 ENCOUNTER — Telehealth: Payer: Self-pay | Admitting: Oncology

## 2012-10-29 VITALS — BP 140/81 | HR 85 | Temp 97.1°F | Resp 20 | Ht 65.0 in | Wt 173.2 lb

## 2012-10-29 DIAGNOSIS — C50912 Malignant neoplasm of unspecified site of left female breast: Secondary | ICD-10-CM

## 2012-10-29 DIAGNOSIS — Z170 Estrogen receptor positive status [ER+]: Secondary | ICD-10-CM

## 2012-10-29 DIAGNOSIS — Z85118 Personal history of other malignant neoplasm of bronchus and lung: Secondary | ICD-10-CM

## 2012-10-29 DIAGNOSIS — Z803 Family history of malignant neoplasm of breast: Secondary | ICD-10-CM

## 2012-10-29 DIAGNOSIS — C50919 Malignant neoplasm of unspecified site of unspecified female breast: Secondary | ICD-10-CM

## 2012-10-29 NOTE — Progress Notes (Signed)
CC:   Georgianne Fick, M.D. Almond Lint, MD  PROBLEM LIST:  1. 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 Algis Downs. Karle Plumber. All 3  lymph nodes were negative. Primary tumor measured 1.5 cm. This  was a stage IA tumor. The patient received 4 cycles of adjuvant  carboplatin VP-16 in combination with Neulasta from 10/03/2006  through 12/05/2006. She remains without evidence of disease  recurrence.  2. Degenerative joint disease with osteoarthritis involving her right  knee.  3. Osteoporosis.  4. Diffuse diverticulosis.  5. Macular degeneration diagnosed in the fall 2011 undergoing  treatment with Avastin.  6. Low vitamin D levels.  7. Invasive carcinoma of the left breast detected by mammogram at  [facility] on 08/06/2012. Previous mammograms had suggested  nonspecific abnormality of the left breast. Core needle biopsy on  08/09/2012 gave the diagnosis. By mammogram, the lesion measured 4  x 9 mm. Biopsy revealed that the tumor was estrogen receptor 100%,  progesterone receptor 8%, HER-2/neu was 1.11, and showed no  amplification by CISH. Ki-67 was 13%. The patient underwent  definitive surgery by Almond Lint, on 08/21/2012. This consisted  of a lumpectomy and sentinel node sampling. The tumor was 1.9 cm,  grade II/III. Closest margin was 1 mm at the superior margin and 2  mm at the posterior margin. Both sentinel lymph nodes were  negative. Pathologic stage was pTIc, pN0, IA.  8. A strongly positive family history of breast cancer in the  patient's mother and maternal grandmother. A half-sister through  the patient's father also had breast cancer. Patient will be  referred for genetic evaluation.   MEDICATIONS:  Reviewed and recorded. Current Outpatient Prescriptions  Medication Sig Dispense Refill  . ALPRAZolam (XANAX) 0.5 MG tablet Take 0.5 mg by mouth 2 (two) times daily as needed. 1/2 to 1 tab po bid for anxiety        . aspirin 81 MG tablet Take 81 mg by mouth daily.      [name] CALCIUM-VITAMIN D PO Take 1 tablet by mouth 2 (two) times daily.        . cetirizine (ZYRTEC) 10 MG tablet Take 10 mg by mouth daily.        . naproxen sodium (ANAPROX) 220 MG tablet Take 220 mg by mouth as needed.      . Omega-3 Fatty Acids (OMEGA 3 PO) Take 3,000 mg by mouth daily.         No current facility-administered medications for this visit.    IMMUNIZATIONS:  The patient does not know if she has ever had a pneumonia shot.  Flu shot was to be given a couple of months ago.   SMOKING HISTORY: Patient smoked up to 2 packs of cigarettes a day for  40 years. She stopped smoking in 1985.    HISTORY:  I saw Sandra Ferguson today for followup of her recently diagnosed invasive carcinoma of the left breast detected on screening mammogram at [facility] on 08/06/2012.  Pathologic stage was IA.  It will be recalled that there were close margins, specifically 1 mm at the superior margin and 2 mm at the posterior margin.  The patient was last seen by [place] on 09/05/2012.  She is here today with her daughter, [name].  The patient was supposed to have had a radiation appointment and also was supposed to have had a bone density scan.  She tells me that she canceled  the radiation appointment.  This will be rescheduled.  Bone density appointment has been scheduled for April 15th and the genetics appointment is for May 8th.  The patient was able to get her prescription for Arimidex filled for less than 20 dollars.  She has not started taking the medicine yet.  There are no new complaints. Today's session was spent mostly in discussing reasons for radiation appointment and the genetics appointment, also the bone density appointment.  We reviewed these in detail with the patient and her daughter.  The patient does have a diagnosis apparently of osteoporosis and is on vitamin D and calcium.  As stated, she does have a close margin  and there is a striking positive family history for breast cancer.  PHYSICAL EXAMINATION:  General:  The patient looks well.  Weight is 173 pounds 3.2 ounces, height 5 feet 5 inches, body surface area 1.9 sq. m. Vital Signs:  Blood pressure 140/81.  Other vital signs are normal. HEENT:  There is no scleral icterus.  Mouth and pharynx are benign.  No peripheral adenopathy palpable.  No axillary adenopathy.  Heart and lungs:  Normal.  No central catheter or port.  Breasts:  Right breast is benign.  The left breast shows considerable healing.  No suspicious findings.  The left nipple is cleaved.  Abdomen:  Somewhat obese, nontender, with no organomegaly or masses palpable.  Extremities:  No peripheral edema.  The patient uses a cane in her right hand.  No lymphedema of the left arm.  LABORATORY DATA:  Today, CBC and chemistries are normal, except for a sodium of 134 and a glucose of 112.  IMAGING STUDIES:  1. CT scan of the chest with IV contrast on 01/08/2009 showed no  significant changes compared with the prior exam of 11/29/2007.  There were some tiny nodules in both lungs that were unchanged.  2. Chest x-ray from 08/04/2009 showed no abnormalities or evidence for  metastatic disease.  3. A CT scan of the chest with IV contrast from 12/08/2009 showed no  evidence for metastatic disease.  4. Two-view chest x-ray from 12/09/2010 showed no active  cardiopulmonary disease.  5. Bilateral digital screening mammograms from 01/10/2011 carried out  at [facility] concluded that the right breast  demonstrated no abnormalities. Additional imaging of the left  breast was recommended and was subsequently negative.  6. Unilateral left breast mammogram was carried out at [facility] on 08/06/2012. There was a 4 x 9 mm hypoechoic mass seen at the 2:00 position located 2 cm from the nipple. Biopsy was recommended.  7. Chest x-ray, 2 view, from 02/20/2012 showed surgical changes of  a prior  right upper lobectomy with no suspicious findings or evidence of acute  cardiopulmonary disease. 8. Chest x-ray, 2 view, from 10/24/2012 showed stable postoperative appearance of the right lung and no acute cardiopulmonary abnormalities.   IMPRESSION AND PLAN:  Today's session was mostly spent in reviewing my recommendations for Radiation Oncology referral, referral to the [facility], and a baseline bone density scan prior to starting Arimidex.  The patient has agreed to these appointments.  We will go ahead and set up another radiation referral.  I am concerned about the close margin.  I have suggested the patient hold on the Arimidex for now.  We will plan to see the patient again in about 6 weeks, roughly May 9th, at which time we will check CBC and chemistries.  If the patient is not going to  have radiation treatments, then we will start the Arimidex without delay.  Otherwise, we will probably hold off on the Arimidex until the patient has completed her radiation treatments.  We seek the input of the radiation oncologist.    ______________________________ Samul Dada, M.D. DSM/MEDQ  D:  10/29/2012  T:  10/29/2012  Job:  [id number]

## 2012-10-29 NOTE — Progress Notes (Signed)
This office note has been dictated.  #[id number]

## 2012-10-29 NOTE — Patient Instructions (Signed)
Please keep your appointments for the Radiation oncologist, genetics consultant and bone density scan.  Do not take Arimidex until we give you the go-ahead.

## 2012-10-29 NOTE — Telephone Encounter (Signed)
Gave pt appt for lab,MD and chemo and Radiation visit. MD visit with labs scheduled for June per pt rqst and ok with Zella Ball RN

## 2012-11-06 ENCOUNTER — Encounter (INDEPENDENT_AMBULATORY_CARE_PROVIDER_SITE_OTHER): Payer: Self-pay

## 2012-11-07 ENCOUNTER — Ambulatory Visit
Admission: RE | Admit: 2012-11-07 | Discharge: 2012-11-07 | Disposition: A | Discharge status: Home / Self Care | Payer: Medicare Other | Source: Ambulatory Visit | Attending: Radiation Oncology | Admitting: Radiation Oncology

## 2012-11-07 ENCOUNTER — Encounter: Payer: Self-pay | Admitting: Radiation Oncology

## 2012-11-07 DIAGNOSIS — Z85118 Personal history of other malignant neoplasm of bronchus and lung: Secondary | ICD-10-CM | POA: Insufficient documentation

## 2012-11-07 DIAGNOSIS — C50919 Malignant neoplasm of unspecified site of unspecified female breast: Secondary | ICD-10-CM | POA: Insufficient documentation

## 2012-11-07 DIAGNOSIS — C50912 Malignant neoplasm of unspecified site of left female breast: Secondary | ICD-10-CM

## 2012-11-07 DIAGNOSIS — Z79899 Other long term (current) drug therapy: Secondary | ICD-10-CM | POA: Insufficient documentation

## 2012-11-07 DIAGNOSIS — Z803 Family history of malignant neoplasm of breast: Secondary | ICD-10-CM | POA: Insufficient documentation

## 2012-11-07 DIAGNOSIS — Z87891 Personal history of nicotine dependence: Secondary | ICD-10-CM | POA: Insufficient documentation

## 2012-11-07 DIAGNOSIS — M810 Age-related osteoporosis without current pathological fracture: Secondary | ICD-10-CM | POA: Insufficient documentation

## 2012-11-07 HISTORY — DX: Personal history of antineoplastic chemotherapy: Z92.21

## 2012-11-07 HISTORY — DX: Malignant neoplasm of unspecified site of left female breast: C50.912

## 2012-11-07 HISTORY — DX: Unspecified osteoarthritis, unspecified site: M19.90

## 2012-11-07 NOTE — Progress Notes (Signed)
Radiation Oncology         [phone] ________________________________  Initial outpatient Consultation  Name: Sandra Ferguson MRN: [id number]  Date: 11/07/2012  DOB: 1926-12-02  MW:[id number],ZDGUY, MD  Almond Lint, MD   REFERRING PHYSICIAN: Almond Lint, MD  DIAGNOSIS: Left Breast invasive ductal carcinoma, T1c N0 M0, grade 2, ER 100% PR 80% HER-2/neu negative. Close margins  HISTORY OF PRESENT ILLNESS::Sandra Ferguson is a 79 y.o. female who was found on screening mammography to have a left breast mass. This was ultimately treated with lumpectomy and sentinel lymph node biopsy on 08/21/2012. A 1.9 cm left breast tumor was excised demonstrating invasive ductal carcinoma grade 2. It is ER positive at 100% PR 8% and HER-2/neu negative. She has a close superior margin of 0.1 cm and a posterior margin is 0.2 cm. She has been prescribed Arimidex but has some hesitation regarding potential side effects of this.  Of note, she has a past medical history significant for small cell lung cancer. This was treated with a right upper lobectomy in 2008. All lymph nodes were negative. It was a stage IA tumor. She underwent adjuvant carboplatin and etoposide and remains without evidence of recurrence.  She reports some postoperative swelling in her breast. She is otherwise in her usual state of health. She says she likes to watch television in her free time. She is here with her daughter who appears very supportive.  PREVIOUS RADIATION THERAPY: No  PAST MEDICAL HISTORY:  has a past medical history of Osteoporosis; Interstitial cystitis; Diverticulosis; Gastritis; Phlebitis (1970); Macular degeneration (2011); Lung cancer (2008); Breast cancer; Wears glasses; Full dentures; Shortness of breath; Asthma; Arthritis; Status post chemotherapy (10/03/2006 - 12/05/2006); DJD (degenerative joint disease); and Breast cancer, left breast.    PAST SURGICAL HISTORY: Past Surgical History  Procedure Laterality Date    . Right upper lobectomy  09/01/2006  . Cholecystectomy      laparoscopic  . Eye surgery      rt cataract  . Breast lumpectomy with needle localization and axillary sentinel lymph node bx  08/21/2012    Procedure: BREAST LUMPECTOMY WITH NEEDLE LOCALIZATION AND AXILLARY SENTINEL LYMPH NODE BX;  Surgeon: Almond Lint, MD;  Location:  SUR[facility]ERY CENTER;  Service: General;  Laterality: Left;    FAMILY HISTORY: family history includes Breast cancer in her maternal grandmother, mother, and sisters and Cancer in her maternal grandfather.  SOCIAL HISTORY:  reports that she quit smoking about 31 years ago. She has quit using smokeless tobacco. She reports that she does not drink alcohol or use illicit drugs.  ALLERGIES: Morphine sulfate  MEDICATIONS:  Current Outpatient Prescriptions  Medication Sig Dispense Refill  . ALPRAZolam (XANAX) 0.5 MG tablet Take 0.5 mg by mouth 2 (two) times daily as needed. 1/2 to 1 tab po bid for anxiety       . aspirin 81 MG tablet Take 81 mg by mouth daily.      [name] CALCIUM-VITAMIN D PO Take 1 tablet by mouth 2 (two) times daily.        . cetirizine (ZYRTEC) 10 MG tablet Take 10 mg by mouth daily.        . naproxen sodium (ANAPROX) 220 MG tablet Take 220 mg by mouth as needed.      . Omega-3 Fatty Acids (OMEGA 3 PO) Take 3,000 mg by mouth daily.         No current facility-administered medications for this encounter.    REVIEW OF SYSTEMS:  Pertinent items are noted in HPI.   PHYSICAL EXAM: General: Alert and oriented, in no acute distress. Witty. HEENT: Head is normocephalic.   Extraocular movements are intact. Oropharynx is clear. Neck: Neck is supple, no palpable cervical or supraclavicular lymphadenopathy. Heart: Regular in rate and rhythm  Chest: Clear to auscultation bilaterally  Abdomen: Soft, nontender, nondistended, with no rigidity or guarding. Extremities: No cyanosis or edema. Lymphatics: No concerning lymphadenopathy. Skin: No concerning  lesions. Musculoskeletal: symmetric strength and muscle tone throughout. Neurologic: Cranial nerves II through XII are grossly intact. No obvious focalities. Speech is fluent. Coordination is intact. Psychiatric: Judgment and insight are intact. Affect is appropriate. Breasts: Left breast - healed well with palpable post-op seroma in LIQ. Right breast unremarkable. No axillary lymphadenopathy.    LABORATORY DATA:  Lab Results  Component Value Date   WBC 5.8 09/05/2012   HGB 12.7 09/05/2012   HCT 36.3 09/05/2012   MCV 87.9 09/05/2012   PLT 191 09/05/2012   CMP     Component Value Date/Time   NA 134* 09/05/2012 1009   NA 134* 02/27/2012 1401   K 4.2 09/05/2012 1009   K 4.3 02/27/2012 1401   CL 99 09/05/2012 1009   CL 97 02/27/2012 1401   CO2 25 09/05/2012 1009   CO2 26 02/27/2012 1401   GLUCOSE 112* 09/05/2012 1009   GLUCOSE 137* 02/27/2012 1401   BUN 9.7 09/05/2012 1009   BUN 8 02/27/2012 1401   CREATININE 0.7 09/05/2012 1009   CREATININE 0.64 02/27/2012 1401   CALCIUM 9.2 09/05/2012 1009   CALCIUM 10.1 02/27/2012 1401   PROT 6.7 09/05/2012 1009   PROT 6.7 02/27/2012 1401   ALBUMIN 3.7 09/05/2012 1009   ALBUMIN 4.4 02/27/2012 1401   AST 17 09/05/2012 1009   AST 16 02/27/2012 1401   ALT 10 09/05/2012 1009   ALT 11 02/27/2012 1401   ALKPHOS 55 09/05/2012 1009   ALKPHOS 44 02/27/2012 1401   BILITOT 0.27 09/05/2012 1009   BILITOT 0.3 02/27/2012 1401         RADIOGRAPHY: Dg Chest 2 View  10/24/2012  *RADIOLOGY REPORT*  Clinical Data: 1-year follow-up exam  CHEST - 2 VIEW  Comparison: 02/20/2012  Findings: Heart size appears normal.  No effusions or edema. Postsurgical changes involving the right lung including volume loss noted.  This is unchanged from previous exam.  Associated asymmetric elevation of the right hemidiaphragm is noted.  IMPRESSION:  1.  No acute cardiopulmonary abnormalities. 2.  Stable postoperative appearance of the right lung.   Original Report Authenticated By: Signa Kell, M.D.         IMPRESSION/PLAN: [age] year old woman with a 1.9 cm left breast tumor, T1cN0. She has close margins. She's not sure if she wants to take antiestrogen therapy. I think that radiotherapy is a good option for local control, especially if she is somewhat reticent regarding compliance of antiestrogen therapy.  We have scheduled her for simulation next week. Anticipate she'll receive 4-5 weeks of radiotherapy, depending on her anatomy. We talked about skin irritation and fatigue that can result from radiotherapy as well as rare injury to the heart and lungs. We talked about cosmetic effects of radiotherapy as well. She is enthusiastic about proceeding. A consent form has been signed and placed in her chart. I very much look forward to participating in her care.   I spent 45 minutes face to face with the patient and more than 50% of that time was spent in counseling and/or coordination  of care.    __________________________________________   Lonie Peak, MD

## 2012-11-07 NOTE — Progress Notes (Signed)
Sandra Ferguson here for assessment following a lumpectomy for Invasive Ductal Carcinoma of the left breast.  She reports occassional  "twinges" in the left breast.  All incisions healed.  She admits to have fatigue and SOB with activities such as dressing and walking.  Has a history of Asthma.  O2 sat was 96% on RA.

## 2012-11-09 ENCOUNTER — Encounter: Payer: Self-pay | Admitting: Radiation Oncology

## 2012-11-09 NOTE — Addendum Note (Signed)
Encounter addended by: Delynn Flavin, RN on: 11/09/2012  7:22 PM<BR>     Documentation filed: Charges VN

## 2012-11-12 ENCOUNTER — Telehealth: Payer: Self-pay | Admitting: Radiation Oncology

## 2012-11-12 ENCOUNTER — Ambulatory Visit
Admission: RE | Admit: 2012-11-12 | Discharge: 2012-11-12 | Disposition: A | Discharge status: Home / Self Care | Payer: Medicare Other | Source: Ambulatory Visit | Attending: Radiation Oncology | Admitting: Radiation Oncology

## 2012-11-12 DIAGNOSIS — Z510 Encounter for antineoplastic radiation therapy: Secondary | ICD-10-CM | POA: Insufficient documentation

## 2012-11-12 DIAGNOSIS — C50912 Malignant neoplasm of unspecified site of left female breast: Secondary | ICD-10-CM

## 2012-11-12 DIAGNOSIS — C50919 Malignant neoplasm of unspecified site of unspecified female breast: Secondary | ICD-10-CM | POA: Insufficient documentation

## 2012-11-12 NOTE — Telephone Encounter (Signed)
Met w patient to discuss RO billing. Pt advised she may need help w transportation and financial assistance and her son said she may not. However, I gave pt an EPP application to complete and return. Pt said she will let me know if wants to apply for MCD.  Dx: Breast cancer, left - Primary 174.9   Attending Rad: SS   Rad Tx: Daily

## 2012-11-12 NOTE — Progress Notes (Signed)
  Radiation Oncology         [phone] ________________________________  Name: Sandra Ferguson MRN: [id number]  Date: 11/12/2012  DOB: 08-28-1926  SIMULATION AND TREATMENT PLANNING NOTE  Outpatient  DIAGNOSIS:  Left breast Cancer  NARRATIVE:  The patient was brought to the CT Simulation planning suite.  Identity was confirmed.  All relevant records and images related to the planned course of therapy were reviewed.  The patient freely provided informed written consent to proceed with treatment after reviewing the details related to the planned course of therapy. The consent form was witnessed and verified by the simulation staff.    Then, the patient was set-up in a stable reproducible  supine position for radiation therapy, head in accuform.  CT images were obtained.  Surface markings were placed.  The CT images were loaded into the planning software.    TREATMENT PLANNING NOTE: Treatment planning then occurred.  The radiation prescription was entered and confirmed.    A total of 3 medically necessary complex treatment devices were fabricated and supervised by me - accuform, and 2 fields with MLCs for custom blocks. I have requested : MLC's.  I have ordered 3D conformal planning with DVH of heart, lungs, lumpectomy cavity.  The patient will receive 40.05 Gy in 15 fractions to her left breast with opposed tangents, followed by a boost of 10 Gy in 5 fractions to the lumpectomy cavity.[name]   -----------------------------------  Lonie Peak, MD

## 2012-11-13 ENCOUNTER — Ambulatory Visit
Admission: RE | Admit: 2012-11-13 | Discharge: 2012-11-13 | Disposition: A | Discharge status: Home / Self Care | Payer: Medicare Other | Source: Ambulatory Visit | Attending: Oncology | Admitting: Oncology

## 2012-11-13 ENCOUNTER — Telehealth: Payer: Self-pay

## 2012-11-13 DIAGNOSIS — M810 Age-related osteoporosis without current pathological fracture: Secondary | ICD-10-CM

## 2012-11-13 NOTE — Telephone Encounter (Signed)
Pt states she will be starting radiation and was wondering when and where she was to get the bone density test and if she should even get the bone density test. I confirmed with the breast center that bone density test has no radiation involved. I tried to call pt back with her appt today at 2 with no answer.

## 2012-11-13 NOTE — Telephone Encounter (Signed)
Pt called and confirmed location and time of bone density test today

## 2012-11-19 ENCOUNTER — Ambulatory Visit: Payer: Medicare Other | Admitting: Radiation Oncology

## 2012-11-19 NOTE — Addendum Note (Signed)
Encounter addended by: Delynn Flavin, RN on: 11/19/2012  6:24 PM<BR>     Documentation filed: Charges VN

## 2012-11-20 ENCOUNTER — Ambulatory Visit: Payer: Medicare Other

## 2012-11-21 ENCOUNTER — Ambulatory Visit: Payer: Medicare Other

## 2012-11-21 ENCOUNTER — Encounter (INDEPENDENT_AMBULATORY_CARE_PROVIDER_SITE_OTHER): Payer: Medicare Other | Admitting: Ophthalmology

## 2012-11-21 DIAGNOSIS — H2510 Age-related nuclear cataract, unspecified eye: Secondary | ICD-10-CM

## 2012-11-21 DIAGNOSIS — H43819 Vitreous degeneration, unspecified eye: Secondary | ICD-10-CM

## 2012-11-21 DIAGNOSIS — H353290 Exudative age-related macular degeneration, unspecified eye, stage unspecified: Secondary | ICD-10-CM

## 2012-11-22 ENCOUNTER — Telehealth: Payer: Self-pay

## 2012-11-22 ENCOUNTER — Ambulatory Visit: Payer: Medicare Other

## 2012-11-22 NOTE — Telephone Encounter (Signed)
Bone scan report received and forwarded to Dr Arline Asp

## 2012-11-23 ENCOUNTER — Ambulatory Visit: Payer: Medicare Other

## 2012-11-26 ENCOUNTER — Ambulatory Visit
Admission: RE | Admit: 2012-11-26 | Discharge: 2012-11-26 | Disposition: A | Discharge status: Home / Self Care | Payer: Medicare Other | Source: Ambulatory Visit | Attending: Radiation Oncology | Admitting: Radiation Oncology

## 2012-11-26 DIAGNOSIS — C50912 Malignant neoplasm of unspecified site of left female breast: Secondary | ICD-10-CM

## 2012-11-26 NOTE — Progress Notes (Signed)
  Radiation Oncology         [phone] ________________________________  Name: REFUGIO MCCONICO MRN: [id number]  Date: 11/26/2012  DOB: [birth year]  Simulation Verification Note  Status: outpatient  NARRATIVE: The patient was brought to the treatment unit and placed in the planned treatment position. The clinical setup was verified. Then port films were obtained and uploaded to the radiation oncology medical record software.  The treatment beams were carefully compared against the planned radiation fields. The position location and shape of the radiation fields was reviewed. They targeted volume of tissue appears to be appropriately covered by the radiation beams. Organs at risk appear to be excluded as planned.  Based on my personal review, I approved the simulation verification. The patient's treatment will proceed as planned.  ------------------------------------------------  Artist Pais Kathrynn Running, M.D.

## 2012-11-27 ENCOUNTER — Ambulatory Visit
Admission: RE | Admit: 2012-11-27 | Discharge: 2012-11-27 | Disposition: A | Discharge status: Home / Self Care | Payer: Medicare Other | Source: Ambulatory Visit | Attending: Radiation Oncology | Admitting: Radiation Oncology

## 2012-11-28 ENCOUNTER — Ambulatory Visit
Admission: RE | Admit: 2012-11-28 | Discharge: 2012-11-28 | Disposition: A | Discharge status: Home / Self Care | Payer: Medicare Other | Source: Ambulatory Visit | Attending: Radiation Oncology | Admitting: Radiation Oncology

## 2012-11-28 ENCOUNTER — Encounter: Payer: Self-pay | Admitting: Oncology

## 2012-11-28 NOTE — Progress Notes (Unsigned)
We have a bone density scan from [facility] dated 11/13/2012.  T score of the lumbar spine was -1.7. T score of the left hip was -2.5.  There were no other scans for comparison.    We will need to review with the patient treatment options regarding her osteoporosis.  The patient has a scheduled appointment with me for 12/31/2012 and 02/25/2013.

## 2012-11-29 ENCOUNTER — Ambulatory Visit: Admission: RE | Admit: 2012-11-29 | Payer: Medicare Other | Source: Ambulatory Visit | Admitting: Radiation Oncology

## 2012-11-29 ENCOUNTER — Ambulatory Visit
Admission: RE | Admit: 2012-11-29 | Discharge: 2012-11-29 | Disposition: A | Discharge status: Home / Self Care | Payer: Medicare Other | Source: Ambulatory Visit | Attending: Radiation Oncology | Admitting: Radiation Oncology

## 2012-11-30 ENCOUNTER — Ambulatory Visit
Admission: RE | Admit: 2012-11-30 | Discharge: 2012-11-30 | Disposition: A | Discharge status: Home / Self Care | Payer: Medicare Other | Source: Ambulatory Visit | Attending: Radiation Oncology | Admitting: Radiation Oncology

## 2012-12-03 ENCOUNTER — Ambulatory Visit
Admission: RE | Admit: 2012-12-03 | Discharge: 2012-12-03 | Disposition: A | Discharge status: Home / Self Care | Payer: Medicare Other | Source: Ambulatory Visit | Attending: Radiation Oncology | Admitting: Radiation Oncology

## 2012-12-03 VITALS — BP 126/72 | HR 88 | Temp 98.5°F | Ht 65.0 in | Wt 172.9 lb

## 2012-12-03 DIAGNOSIS — C50912 Malignant neoplasm of unspecified site of left female breast: Secondary | ICD-10-CM

## 2012-12-03 DIAGNOSIS — C3492 Malignant neoplasm of unspecified part of left bronchus or lung: Secondary | ICD-10-CM

## 2012-12-03 MED ORDER — ALRA NON-METALLIC DEODORANT (RAD-ONC)
1.0000 "application " | Freq: Once | TOPICAL | Status: AC
  Administered 2012-12-03: 1 via TOPICAL

## 2012-12-03 MED ORDER — RADIAPLEXRX EX GEL
Freq: Once | CUTANEOUS | Status: AC
  Administered 2012-12-03: 1 via TOPICAL

## 2012-12-03 NOTE — Progress Notes (Signed)
   Weekly Management Note:  outpatient Current Dose:  13.35 Gy  Projected Dose: 50.05 Gy   Narrative:  The patient presents for routine under treatment assessment.  CBCT/MVCT images/Port film x-rays were reviewed.  The chart was checked. She is doing well, no new complaints  Physical Findings:  height is 5\' 5"  (1.651 m) and weight is 172 lb 14.4 oz (78.427 kg). Her temperature is 98.5 F (36.9 C). Her blood pressure is 126/72 and her pulse is 88.  left breast slightly pink  Impression:  The patient is tolerating radiotherapy.  Plan:  Continue radiotherapy as planned.  ________________________________   Lonie Peak, M.D.

## 2012-12-03 NOTE — Progress Notes (Signed)
Sandra Ferguson is here for her weekly under treat visit. She has had 5/15 fractions to her left breast.  She denies pain except for a little soreness in her underarm area.  She does have fatigue.  The skin on her left breast is pink.  She is also having hot flashes at night that started a week ago and is wondering if it is due to radiation.  She was given the radiation therapy and you book and educated on the potential side effects of radiation including fatigue and skin changes.  She was given Alra Deoderant and Radiaplex gel and instructed to apply it twice a day with the first application at least 4 hours before her treatment time.

## 2012-12-04 ENCOUNTER — Encounter (INDEPENDENT_AMBULATORY_CARE_PROVIDER_SITE_OTHER): Payer: Medicare Other | Admitting: General Surgery

## 2012-12-04 ENCOUNTER — Ambulatory Visit
Admission: RE | Admit: 2012-12-04 | Discharge: 2012-12-04 | Disposition: A | Discharge status: Home / Self Care | Payer: Medicare Other | Source: Ambulatory Visit | Attending: Radiation Oncology | Admitting: Radiation Oncology

## 2012-12-05 ENCOUNTER — Ambulatory Visit
Admission: RE | Admit: 2012-12-05 | Discharge: 2012-12-05 | Disposition: A | Discharge status: Home / Self Care | Payer: Medicare Other | Source: Ambulatory Visit | Attending: Radiation Oncology | Admitting: Radiation Oncology

## 2012-12-06 ENCOUNTER — Encounter: Payer: Medicare Other | Admitting: Genetic Counselor

## 2012-12-06 ENCOUNTER — Ambulatory Visit
Admission: RE | Admit: 2012-12-06 | Discharge: 2012-12-06 | Disposition: A | Discharge status: Home / Self Care | Payer: Medicare Other | Source: Ambulatory Visit | Attending: Radiation Oncology | Admitting: Radiation Oncology

## 2012-12-06 ENCOUNTER — Other Ambulatory Visit: Payer: Medicare Other | Admitting: Lab

## 2012-12-07 ENCOUNTER — Ambulatory Visit
Admission: RE | Admit: 2012-12-07 | Discharge: 2012-12-07 | Disposition: A | Discharge status: Home / Self Care | Payer: Medicare Other | Source: Ambulatory Visit | Attending: Radiation Oncology | Admitting: Radiation Oncology

## 2012-12-08 ENCOUNTER — Ambulatory Visit
Admission: RE | Admit: 2012-12-08 | Discharge: 2012-12-08 | Disposition: A | Discharge status: Home / Self Care | Payer: Medicare Other | Source: Ambulatory Visit | Attending: Radiation Oncology | Admitting: Radiation Oncology

## 2012-12-10 ENCOUNTER — Ambulatory Visit
Admission: RE | Admit: 2012-12-10 | Discharge: 2012-12-10 | Disposition: A | Discharge status: Home / Self Care | Payer: Medicare Other | Source: Ambulatory Visit | Attending: Radiation Oncology | Admitting: Radiation Oncology

## 2012-12-10 ENCOUNTER — Encounter: Payer: Self-pay | Admitting: Radiation Oncology

## 2012-12-10 VITALS — BP 126/55 | HR 79 | Temp 98.5°F | Wt 173.4 lb

## 2012-12-10 DIAGNOSIS — C50912 Malignant neoplasm of unspecified site of left female breast: Secondary | ICD-10-CM

## 2012-12-10 NOTE — Progress Notes (Signed)
Patient here for weekly assessment of radiation of left breast.Completed 10 of 15 treatments.Skin is pink.Has some tenderness.Has increased fatigue relieved with daily nap.

## 2012-12-10 NOTE — Progress Notes (Signed)
   Weekly Management Note: Outpatient Current Dose:  26.7 Gy  Projected Dose: 50.05 Gy   Narrative:  The patient presents for routine under treatment assessment.  CBCT/MVCT images/Port film x-rays were reviewed.  The chart was checked. Doing well. Not using radiaplex  Physical Findings:  weight is 173 lb 6.4 oz (78.654 kg). Her temperature is 98.5 F (36.9 C). Her blood pressure is 126/55 and her pulse is 79.  increased erythema over left breast (moderate) ; skin intact  Impression:  The patient is tolerating radiotherapy.  Plan:  Continue radiotherapy as planned. Start Radiaplex.  ________________________________   Lonie Peak, M.D.

## 2012-12-11 ENCOUNTER — Ambulatory Visit
Admission: RE | Admit: 2012-12-11 | Discharge: 2012-12-11 | Disposition: A | Discharge status: Home / Self Care | Payer: Medicare Other | Source: Ambulatory Visit | Attending: Radiation Oncology | Admitting: Radiation Oncology

## 2012-12-11 ENCOUNTER — Ambulatory Visit: Payer: Medicare Other

## 2012-12-12 ENCOUNTER — Ambulatory Visit
Admission: RE | Admit: 2012-12-12 | Discharge: 2012-12-12 | Disposition: A | Discharge status: Home / Self Care | Payer: Medicare Other | Source: Ambulatory Visit | Attending: Radiation Oncology | Admitting: Radiation Oncology

## 2012-12-13 ENCOUNTER — Ambulatory Visit
Admission: RE | Admit: 2012-12-13 | Discharge: 2012-12-13 | Disposition: A | Discharge status: Home / Self Care | Payer: Medicare Other | Source: Ambulatory Visit | Attending: Radiation Oncology | Admitting: Radiation Oncology

## 2012-12-14 ENCOUNTER — Ambulatory Visit
Admission: RE | Admit: 2012-12-14 | Discharge: 2012-12-14 | Disposition: A | Discharge status: Home / Self Care | Payer: Medicare Other | Source: Ambulatory Visit | Attending: Radiation Oncology | Admitting: Radiation Oncology

## 2012-12-17 ENCOUNTER — Ambulatory Visit
Admission: RE | Admit: 2012-12-17 | Discharge: 2012-12-17 | Disposition: A | Discharge status: Home / Self Care | Payer: Medicare Other | Source: Ambulatory Visit | Attending: Radiation Oncology | Admitting: Radiation Oncology

## 2012-12-17 ENCOUNTER — Encounter: Payer: Self-pay | Admitting: Radiation Oncology

## 2012-12-17 VITALS — BP 127/52 | HR 69 | Temp 97.8°F | Resp 20 | Wt 172.5 lb

## 2012-12-17 DIAGNOSIS — C50912 Malignant neoplasm of unspecified site of left female breast: Secondary | ICD-10-CM

## 2012-12-17 NOTE — Progress Notes (Signed)
Weekly Management Note:  Site: Left breast Current Dose:  4005  cGy Projected Dose: 5005  cGy  Narrative: The patient is seen today for routine under treatment assessment. CBCT/MVCT images/port films were reviewed. The chart was reviewed.   No significant breast discomfort. She uses Radioplex gel when necessary. She has occasional leg cramps which predated the start of her radiation therapy.  Physical Examination:  Filed Vitals:   12/17/12 1514  BP: 127/52  Pulse: 69  Temp: 97.8 F (36.6 C)  Resp: 20  .  Weight: 172 lb 8 oz (78.245 kg). There is mild to moderate erythema the skin with no areas of desquamation.  Impression: Tolerating radiation therapy well.  Plan: Continue radiation therapy as planned.

## 2012-12-17 NOTE — Progress Notes (Signed)
Weekly radt txs, 15/20 lt breast completed, alert,oriented x3, slight erythema on breast, skin intact, shooting pains in left breast occasionally, hasn't started radiaplex gel as yet, informed her to start using radiaplex today, pat agreed, has generalized cramps in legs and ribs ,this was even before she started rad txs 3:18 PM

## 2012-12-18 ENCOUNTER — Ambulatory Visit
Admission: RE | Admit: 2012-12-18 | Discharge: 2012-12-18 | Disposition: A | Discharge status: Home / Self Care | Payer: Medicare Other | Source: Ambulatory Visit | Attending: Radiation Oncology | Admitting: Radiation Oncology

## 2012-12-19 ENCOUNTER — Ambulatory Visit
Admission: RE | Admit: 2012-12-19 | Discharge: 2012-12-19 | Disposition: A | Discharge status: Home / Self Care | Payer: Medicare Other | Source: Ambulatory Visit | Attending: Radiation Oncology | Admitting: Radiation Oncology

## 2012-12-20 ENCOUNTER — Ambulatory Visit
Admission: RE | Admit: 2012-12-20 | Discharge: 2012-12-20 | Disposition: A | Discharge status: Home / Self Care | Payer: Medicare Other | Source: Ambulatory Visit | Attending: Radiation Oncology | Admitting: Radiation Oncology

## 2012-12-21 ENCOUNTER — Ambulatory Visit
Admission: RE | Admit: 2012-12-21 | Discharge: 2012-12-21 | Disposition: A | Discharge status: Home / Self Care | Payer: Medicare Other | Source: Ambulatory Visit | Attending: Radiation Oncology | Admitting: Radiation Oncology

## 2012-12-25 ENCOUNTER — Encounter: Payer: Self-pay | Admitting: Radiation Oncology

## 2012-12-25 ENCOUNTER — Ambulatory Visit
Admission: RE | Admit: 2012-12-25 | Discharge: 2012-12-25 | Disposition: A | Discharge status: Home / Self Care | Payer: Medicare Other | Source: Ambulatory Visit | Attending: Radiation Oncology | Admitting: Radiation Oncology

## 2012-12-25 VITALS — BP 114/54 | HR 79 | Resp 18 | Wt 172.0 lb

## 2012-12-25 DIAGNOSIS — C50912 Malignant neoplasm of unspecified site of left female breast: Secondary | ICD-10-CM

## 2012-12-25 NOTE — Progress Notes (Signed)
   Weekly Management Note Completed Radiotherapy. Total Dose:  50.Sandra Ferguson   Narrative:  The patient presents for routine under treatment assessment on last day of radiotherapy.  CBCT/MVCT images/Port film x-rays were reviewed.  The chart was checked. Doing well. Tender left nipple.  Physical Findings:  weight is 172 lb (78.019 kg). Her blood pressure is 114/54 and her pulse is 79. Her respiration is 18.  Moderate erythema over left breast with small area of dry desquamation at left inframammary fold  Impression:  The patient has tolerated radiotherapy.  Plan:  Routine follow-up in one month. Continue Radiaplex until then, transition to body lotion if she runs out in 2-3 weeks. ________________________________   Lonie Peak, M.D.

## 2012-12-25 NOTE — Progress Notes (Addendum)
Patient denies pain at this time. However, patient reports tenderness of left nipple. Left nipple very dry. Encouraged patient to apply radiaplex to nipple and protect it from fraction with a nonadherent dressing. Encouraged patient to continue to use radiaplex gel bid as directed for two weeks following completion. Only faint hyperpigmentation of left/treated breast noted without desquamation. Patient reports fatigue. Patient reports that she drinks one can of ensure each day to fight this fatigue. Provided patient with an appointment card to return in one month for follow up. Patient verbalized understanding of all reviewed.

## 2012-12-26 ENCOUNTER — Ambulatory Visit: Payer: Medicare Other

## 2012-12-27 ENCOUNTER — Ambulatory Visit: Payer: Medicare Other

## 2012-12-28 ENCOUNTER — Ambulatory Visit: Payer: Medicare Other

## 2012-12-28 ENCOUNTER — Encounter (INDEPENDENT_AMBULATORY_CARE_PROVIDER_SITE_OTHER): Payer: Medicare Other | Admitting: General Surgery

## 2012-12-31 ENCOUNTER — Encounter: Payer: Self-pay | Admitting: Medical Oncology

## 2012-12-31 ENCOUNTER — Other Ambulatory Visit: Payer: Self-pay | Admitting: Medical Oncology

## 2012-12-31 ENCOUNTER — Encounter: Payer: Self-pay | Admitting: Oncology

## 2012-12-31 ENCOUNTER — Ambulatory Visit (HOSPITAL_BASED_OUTPATIENT_CLINIC_OR_DEPARTMENT_OTHER): Payer: Medicare Other | Admitting: Oncology

## 2012-12-31 ENCOUNTER — Ambulatory Visit: Payer: Medicare Other

## 2012-12-31 ENCOUNTER — Other Ambulatory Visit (HOSPITAL_BASED_OUTPATIENT_CLINIC_OR_DEPARTMENT_OTHER): Payer: Medicare Other | Admitting: Lab

## 2012-12-31 VITALS — BP 117/67 | HR 84 | Temp 98.1°F | Resp 18 | Ht 65.0 in | Wt 170.2 lb

## 2012-12-31 DIAGNOSIS — R350 Frequency of micturition: Secondary | ICD-10-CM

## 2012-12-31 DIAGNOSIS — Z853 Personal history of malignant neoplasm of breast: Secondary | ICD-10-CM

## 2012-12-31 DIAGNOSIS — N644 Mastodynia: Secondary | ICD-10-CM

## 2012-12-31 DIAGNOSIS — R300 Dysuria: Secondary | ICD-10-CM

## 2012-12-31 DIAGNOSIS — C50912 Malignant neoplasm of unspecified site of left female breast: Secondary | ICD-10-CM

## 2012-12-31 DIAGNOSIS — C50919 Malignant neoplasm of unspecified site of unspecified female breast: Secondary | ICD-10-CM

## 2012-12-31 DIAGNOSIS — Z85118 Personal history of other malignant neoplasm of bronchus and lung: Secondary | ICD-10-CM

## 2012-12-31 DIAGNOSIS — C3491 Malignant neoplasm of unspecified part of right bronchus or lung: Secondary | ICD-10-CM

## 2012-12-31 LAB — URINALYSIS, MICROSCOPIC - CHCC
Bilirubin (Urine): NEGATIVE
Glucose: NEGATIVE mg/dL
Ketones: NEGATIVE mg/dL
Nitrite: NEGATIVE
pH: 6 (ref 4.6–8.0)

## 2012-12-31 LAB — CBC WITH DIFFERENTIAL/PLATELET
Basophils Absolute: 0 10*3/uL (ref 0.0–0.1)
EOS%: 2.8 % (ref 0.0–7.0)
Eosinophils Absolute: 0.2 10*3/uL (ref 0.0–0.5)
HCT: 37.3 % (ref 34.8–46.6)
HGB: 12.9 g/dL (ref 11.6–15.9)
MCH: 30.1 pg (ref 25.1–34.0)
MCV: 87.3 fL (ref 79.5–101.0)
NEUT#: 6.1 10*3/uL (ref 1.5–6.5)
NEUT%: 78.9 % — ABNORMAL HIGH (ref 38.4–76.8)
lymph#: 0.8 10*3/uL — ABNORMAL LOW (ref 0.9–3.3)

## 2012-12-31 LAB — COMPREHENSIVE METABOLIC PANEL (CC13)
AST: 20 U/L (ref 5–34)
Albumin: 3.7 g/dL (ref 3.5–5.0)
BUN: 9.4 mg/dL (ref 7.0–26.0)
Calcium: 9.4 mg/dL (ref 8.4–10.4)
Chloride: 95 mEq/L — ABNORMAL LOW (ref 98–107)
Creatinine: 0.7 mg/dL (ref 0.6–1.1)
Glucose: 138 mg/dl — ABNORMAL HIGH (ref 70–99)

## 2012-12-31 LAB — LACTATE DEHYDROGENASE (CC13): LDH: 130 U/L (ref 125–245)

## 2012-12-31 MED ORDER — CEPHALEXIN 500 MG PO CAPS: 500.0000 mg | ORAL_CAPSULE | Freq: Two times a day (BID) | ORAL | Status: DC

## 2012-12-31 MED ORDER — ANASTROZOLE 1 MG PO TABS: 1.0000 mg | ORAL_TABLET | Freq: Every day | ORAL | Status: DC

## 2012-12-31 NOTE — Progress Notes (Signed)
  Radiation Oncology         [phone] ________________________________  Name: Sandra Ferguson MRN: [id number]  Date: 12/25/2012  DOB: November 21, 1926  End of Treatment Note  Diagnosis:   Left Breast invasive ductal carcinoma, T1c N0 M0, grade 2, ER 100% PR 80% HER-2/neu negative.  Indication for treatment:  curative    Radiation treatment dates:   11/27/2012-12/25/2012  Site/dose:   1) Left Breast / 40.05Gy in 15 fractions 2) Left Breast Boost / 10 Gy in 5 fractions  Beams/energy:   1) Opposed tangents / photons 2) En Face electrons / 15 MeV electrons  Narrative: The patient tolerated radiation treatment relatively well.   She developed moderate erythema and a small area of dry desquamation.  Plan: The patient has completed radiation treatment. The patient will return to radiation oncology clinic for routine followup in one month. I advised them to call or return sooner if they have any questions or concerns related to their recovery or treatment.  -----------------------------------  Lonie Peak, MD

## 2012-12-31 NOTE — Progress Notes (Signed)
This office note has been dictated.  #[id number]

## 2012-12-31 NOTE — Telephone Encounter (Signed)
Gave pt appt for lab and MD visit on September 2014 3 months appts

## 2013-01-01 ENCOUNTER — Telehealth: Payer: Self-pay

## 2013-01-01 ENCOUNTER — Ambulatory Visit: Payer: Medicare Other

## 2013-01-01 NOTE — Progress Notes (Signed)
CC:   Georgianne Fick, M.D. Almond Lint, MD  PROBLEM LIST:  1. 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 Algis Downs. Karle Plumber. All 3  lymph nodes were negative. Primary tumor measured 1.5 cm. This  was a stage IA tumor. The patient received 4 cycles of adjuvant  carboplatin VP-16 in combination with Neulasta from 10/03/2006  through 12/05/2006. She remains without evidence of disease  recurrence.  2. Degenerative joint disease with osteoarthritis involving her right  knee.  3. Osteoporosis.  4. Diffuse diverticulosis.  5. Macular degeneration diagnosed in the fall 2011 undergoing  treatment with Avastin.  6. Low vitamin D levels.   7. Invasive carcinoma of the left breast detected by mammogram at  [facility] on 08/06/2012. Previous mammograms had suggested  nonspecific abnormality of the left breast. Core needle biopsy on  08/09/2012 gave the diagnosis. By mammogram, the lesion measured 4  x 9 mm. Biopsy revealed that the tumor was estrogen receptor 100%,  progesterone receptor 8%, HER-2/neu was 1.11, and showed no  amplification by CISH. Ki-67 was 13%. The patient underwent  definitive surgery by Almond Lint, on 08/21/2012. This consisted  of a lumpectomy and sentinel node sampling. The tumor was 1.9 cm,  grade II/III. Closest margin was 1 mm at the superior margin and 2  mm at the posterior margin. Both sentinel lymph nodes were  negative. Pathologic stage was pTIc, pN0, IA. The patient completed radiation treatments to the left breast on 12/25/2012.  I believe she received a total dose of 5005 cGy.  Arimidex 1 mg daily is being initiated on 12/31/2012.   8. A strongly positive family history of breast cancer in the  patient's mother and maternal grandmother. A half-sister through  the patient's father also had breast cancer. Patient will be  referred for genetic evaluation.    MEDICATIONS:  Reviewed and  recorded. Current Outpatient Prescriptions  Medication Sig Dispense Refill  . ALPRAZolam (XANAX) 0.5 MG tablet Take 0.5 mg by mouth 2 (two) times daily as needed. 1/2 to 1 tab po bid for anxiety       . aspirin 81 MG tablet Take 81 mg by mouth daily.      [name] CALCIUM-VITAMIN D PO Take 1 tablet by mouth 2 (two) times daily.        . cetirizine (ZYRTEC) 10 MG tablet Take 10 mg by mouth daily.        . hyaluronate sodium (RADIAPLEXRX) GEL Apply 1 application topically 2 (two) times daily.      . naproxen sodium (ANAPROX) 220 MG tablet Take 220 mg by mouth as needed.      . Omega-3 Fatty Acids (OMEGA 3 PO) Take 3,000 mg by mouth daily.        [name] anastrozole (ARIMIDEX) 1 MG tablet Take 1 tablet (1 mg total) by mouth daily.  90 tablet  3  . cephALEXin (KEFLEX) 500 MG capsule Take 1 capsule (500 mg total) by mouth 2 (two) times daily.  14 capsule  0   No current facility-administered medications for this visit.     TREATMENT PROGRAM:   -Arimidex 1 mg daily for planned 5 years, to be started on 12/31/2012.  IMMUNIZATIONS:  The patient does not know if she has ever had a pneumonia shot.  A flu shot was to be given in the fall of 2013.  SMOKING HISTORY: Patient smoked up to 2 packs of cigarettes a day  for  40 years. She stopped smoking in 1985.    HISTORY:  Sandra Ferguson was seen today for followup of her invasive ductal carcinoma of the left breast detected on routine screening at [facility] on 08/06/2012.  Estrogen receptor is strongly positive.  Pathologic stage was T1c, IA.  The patient was last seen by [place] on 10/29/2012.  She is here today with her daughter, [name].  The patient's main issue today is some pain and tenderness involving her left breast which is somewhat inflamed following radiation treatments.  The patient also is complaining of some dysuria, cloudy and foul-smelling urine.  She has had also some cough and congestion which she attributes to the radiation.  Aside  from this, the patient seems to be doing well.  She did complete her radiation treatments on May 27th.  There are no symptoms to suggest metastatic disease.  PHYSICAL EXAMINATION:  General:  The patient looks well.  She is 79 years old.  Weight is 170 pounds 3.2 ounces, height 5 feet 5 inches, body surface area 1.88 sq m.  Vital Signs:  Blood pressure 117/67. Other vital signs are normal.  HEENT:  There is no scleral icterus. Mouth and pharynx are benign.  There is no peripheral adenopathy palpable.  No axillary adenopathy.  Heart and lungs:  Normal.  Breasts: Right breast is benign.  Left breast is reddened.  Skin appears to be intact.  The left nipple is cleaved.  Left breast also is somewhat smaller than the right breast.  No palpable masses or suspicious findings.  Abdomen:  Benign, somewhat obese with no organomegaly or masses palpable.  Extremities:  No peripheral edema.  Patient uses a cane in her right hand.  No obvious lymphedema of the left arm.  LABORATORY DATA:  Today, white count. 7.7, ANC 6.1, hemoglobin 12.9, hematocrit 37.3, platelets 178,000.  Chemistries today are pending. Chemistries from 09/05/2012 were normal except for a glucose of 112 and a sodium of 134.  LDH today was 130.  Urinalysis and culture are pending.  IMAGING STUDIES:  1. CT scan of the chest with IV contrast on 01/08/2009 showed no  significant changes compared with the prior exam of 11/29/2007.  There were some tiny nodules in both lungs that were unchanged.  2. Chest x-ray from 08/04/2009 showed no abnormalities or evidence for  metastatic disease.  3. A CT scan of the chest with IV contrast from 12/08/2009 showed no  evidence for metastatic disease.  4. Two-view chest x-ray from 12/09/2010 showed no active  cardiopulmonary disease.  5. Bilateral digital screening mammograms from 01/10/2011 carried out  at [facility] concluded that the right breast  demonstrated no abnormalities.  Additional imaging of the left  breast was recommended and was subsequently negative.  6. Unilateral left breast mammogram was carried out at [facility] on 08/06/2012. There was a 4 x 9 mm hypoechoic mass seen at the 2:00 position located 2 cm from the nipple. Biopsy was recommended.  7. Chest x-ray, 2 view, from 02/20/2012 showed surgical changes of a prior  right upper lobectomy with no suspicious findings or evidence of acute  cardiopulmonary disease.  8. Chest x-ray, 2 view, from 10/24/2012 showed stable postoperative  appearance of the right lung and no acute cardiopulmonary abnormalities. 9. Bone density scan carried out at the [facility] [place] on 11/13/2012 showed a T-score of the lumbar spine of -1.7.  T-score of the left hip was -2.5.  No prior  scans were available for comparison.   IMPRESSION AND PLAN:  Mrs. Germani has completed radiation treatments to the left breast following lumpectomy.  We are going to give her a prescription Arimidex to take 1 mg daily for the next 5 years as adjuvant treatment.  The patient has been cautioned about osteoporosis and musculoskeletal pain.  We are checking a vitamin D level today.  The patient has a history of low vitamin D levels.  I do not think we have checked any here.  She is on calcium and vitamin D.  We have talked to her about osteoporosis.  She will need a followup bone density scan in a couple of years.  The patient is having symptoms suggestive of a urinary tract infection. She is asking for treatment.  We have urinalysis and culture pending. We are going to give her a prescription for Keflex 500 mg twice a day for the next week.  She is not allergic to penicillin or cephalosporins.  We spent a fair amount of time talking about genetics testing in view of the strong family history of breast cancer.  I tried to emphasize that there may be preventative actions which may lessen risk for patient's daughters and  granddaughters.  Genetics testing, however, will have to start with her.  Even if her testing is negative, certainly the female members of the family will need close scrutiny and screening since they remain at increased risk.  I have asked the patient to return in 3 months, at which time we will check CBC and chemistries.  A vitamin D level was added today.    ______________________________ Samul Dada, M.D. DSM/MEDQ  D:  12/31/2012  T:  01/01/2013  Job:  [id number]

## 2013-01-01 NOTE — Telephone Encounter (Signed)
S/w pt that her D level was 30 and that is low normal. We will fax lab results to Dr Nicholos Johns for him to manage. She states she is taking Vit D3 1000 units daily as well as her Oscal 500-200 BID. This information forwarded on fax to Dr Nicholos Johns.

## 2013-01-02 ENCOUNTER — Ambulatory Visit: Payer: Medicare Other

## 2013-01-03 ENCOUNTER — Ambulatory Visit: Payer: Medicare Other

## 2013-01-03 ENCOUNTER — Telehealth: Payer: Self-pay

## 2013-01-03 NOTE — Telephone Encounter (Signed)
S/w pt that urine culture confirmed UTI, also that the antibiotic we prescribed was correct. Pt confirmed she is taking the antibiotic.

## 2013-01-04 ENCOUNTER — Ambulatory Visit: Payer: Medicare Other

## 2013-01-07 ENCOUNTER — Ambulatory Visit: Payer: Medicare Other

## 2013-01-08 ENCOUNTER — Ambulatory Visit: Payer: Medicare Other

## 2013-01-08 ENCOUNTER — Encounter (INDEPENDENT_AMBULATORY_CARE_PROVIDER_SITE_OTHER): Payer: Self-pay | Admitting: General Surgery

## 2013-01-08 ENCOUNTER — Ambulatory Visit (INDEPENDENT_AMBULATORY_CARE_PROVIDER_SITE_OTHER): Payer: Medicare Other | Admitting: General Surgery

## 2013-01-08 VITALS — BP 100/56 | HR 90 | Temp 96.9°F | Ht 63.5 in | Wt 169.8 lb

## 2013-01-08 DIAGNOSIS — C50919 Malignant neoplasm of unspecified site of unspecified female breast: Secondary | ICD-10-CM

## 2013-01-08 DIAGNOSIS — C50912 Malignant neoplasm of unspecified site of left female breast: Secondary | ICD-10-CM

## 2013-01-08 NOTE — Assessment & Plan Note (Signed)
I encouraged the patient to take anastrozole.  I sent a message to the breast navigator regarding radiation cream. Hopefully we can facilitate getting some more of this.  I will see her back in 6 months. At that point we will need to set up her mammogram.  She has no clinical evidence of disease. I reassured her that the pain that she is having should start to improve, but it may take a while to get better.

## 2013-01-08 NOTE — Progress Notes (Signed)
HISTORY: Patient is status post breast conservation therapy for left breast cancer in January of 2014. She has recently completed radiation on May 27. She is having some soreness in her breast. She complains of irritation in the inframammary fold. She states that sometimes she has sharp pains that shoot through her breast. She felt some thickening at the site of the surgery. She states she has not yet started her Arimidex do to being concerned about it being a hormone.    PERTINENT REVIEW OF SYSTEMS: Otherwise negative.    Filed Vitals:   01/08/13 1417  BP: 100/56  Pulse: 90  Temp: 96.9 F (36.1 C)   Filed Weights   01/08/13 1417  Weight: 169 lb 12.8 oz (77.021 kg)     EXAM: Head: Normocephalic and atraumatic.  Eyes:  Conjunctivae are normal. Pupils are equal, round, and reactive to light. No scleral icterus.  Neck:  Normal range of motion. Neck supple. No tracheal deviation present. No thyromegaly present.  Resp: No respiratory distress, normal effort. Breast:  No palpable masses or lymphadenopathy.  There is hyperpigmentation of the left breast.  There is redness in the inframammary fold with increased hyperpigmentation.  The scar is sl thickened.   Abd:  Abdomen is soft, non distended and non tender. No masses are palpable.  There is no rebound and no guarding.  Neurological: Alert and oriented to person, place, and time. Coordination normal.  Skin: Skin is warm and dry. No rash noted. No diaphoretic. No erythema. No pallor.  Psychiatric: Normal mood and affect. Normal behavior. Judgment and thought content normal.      ASSESSMENT AND PLAN:   Breast cancer, left, cT1N0 +/+/-, Ki67 13% I encouraged the patient to take anastrozole.  I sent a message to the breast navigator regarding radiation cream. Hopefully we can facilitate getting some more of this.  I will see her back in 6 months. At that point we will need to set up her mammogram.  She has no clinical evidence of  disease. I reassured her that the pain that she is having should start to improve, but it may take a while to get better.      Maudry Diego, MD Surgical Oncology, General & Endocrine Surgery [facility]  Georgianne Fick, MD Georgianne Fick, MD

## 2013-01-08 NOTE — Patient Instructions (Signed)
Take Arimedex (anastrazole).  Follow up at the cancer center.    Follow up with me in 6 months.

## 2013-01-09 ENCOUNTER — Encounter (INDEPENDENT_AMBULATORY_CARE_PROVIDER_SITE_OTHER): Payer: Medicare Other | Admitting: Ophthalmology

## 2013-01-09 ENCOUNTER — Encounter: Payer: Self-pay | Admitting: Oncology

## 2013-01-09 ENCOUNTER — Ambulatory Visit: Payer: Medicare Other

## 2013-01-09 DIAGNOSIS — H43819 Vitreous degeneration, unspecified eye: Secondary | ICD-10-CM

## 2013-01-09 DIAGNOSIS — H353290 Exudative age-related macular degeneration, unspecified eye, stage unspecified: Secondary | ICD-10-CM

## 2013-01-09 NOTE — Progress Notes (Signed)
The urine culture from 12/31/2012 grew out Klebsiella pneumoniae resistant to ampicillin and indeterminate to nitrofurantoin. There was sensitivity to the other antibiotics tested, including cephalosporins.  It will be recalled that the patient had been given Keflex when I saw her on 12/31/2012.

## 2013-01-09 NOTE — Progress Notes (Signed)
Electron Boost Treatment Planning Note 12/10/12 Diagnosis: Breast Cancer  The patient's CT images from her free-breathing simulation were reviewed to plan her boost treatment to her left breast  lumpectomy cavity.  The boost to the lumpectomy cavity will be delivered with an en face electron field, using 15 MeV energy. 10 Gy/ 5 fractions will be prescribed to the 95% isodose line.  Special port plan approved. Custom electron block has been made to shield normal tissues.  -----------------------------------  Lonie Peak, MD

## 2013-01-10 ENCOUNTER — Ambulatory Visit: Payer: Medicare Other

## 2013-01-11 ENCOUNTER — Ambulatory Visit: Payer: Medicare Other

## 2013-01-11 ENCOUNTER — Telehealth: Payer: Self-pay | Admitting: Dietician

## 2013-01-31 ENCOUNTER — Encounter: Payer: Self-pay | Admitting: Radiation Oncology

## 2013-02-05 ENCOUNTER — Encounter: Payer: Self-pay | Admitting: Radiation Oncology

## 2013-02-05 ENCOUNTER — Ambulatory Visit
Admission: RE | Admit: 2013-02-05 | Discharge: 2013-02-05 | Disposition: A | Discharge status: Home / Self Care | Payer: Medicare Other | Source: Ambulatory Visit | Attending: Radiation Oncology | Admitting: Radiation Oncology

## 2013-02-05 VITALS — BP 117/55 | HR 73 | Temp 98.1°F | Ht 63.5 in | Wt 170.2 lb

## 2013-02-05 DIAGNOSIS — L819 Disorder of pigmentation, unspecified: Secondary | ICD-10-CM | POA: Insufficient documentation

## 2013-02-05 DIAGNOSIS — Z79899 Other long term (current) drug therapy: Secondary | ICD-10-CM | POA: Insufficient documentation

## 2013-02-05 DIAGNOSIS — C50919 Malignant neoplasm of unspecified site of unspecified female breast: Secondary | ICD-10-CM | POA: Insufficient documentation

## 2013-02-05 DIAGNOSIS — C50912 Malignant neoplasm of unspecified site of left female breast: Secondary | ICD-10-CM

## 2013-02-05 HISTORY — DX: Personal history of irradiation: Z92.3

## 2013-02-05 MED ORDER — RADIAPLEXRX EX GEL
Freq: Once | CUTANEOUS | Status: AC
  Administered 2013-02-05: 18:00:00 via TOPICAL

## 2013-02-05 NOTE — Progress Notes (Signed)
Ms. Karis in exam room 3. S/P radiation to left breast which completed in 12/25/12.  Note mild hyperpigmentation of her breast, but skin is intact and soft.  She has itching in the inframmary fold , but do not note any rash. Given radia plex per request.

## 2013-02-05 NOTE — Progress Notes (Signed)
  Radiation Oncology         [phone] ________________________________  Name: Sandra Ferguson MRN: [id number]  Date: 02/05/2013  DOB: Nov 03, 1926  Follow-Up Visit Note  Outpatient  CC: Georgianne Fick, MD  Georgianne Fick, MD  Diagnosis and Prior Radiotherapy:   Left Breast invasive ductal carcinoma, T1c N0 M0, grade 2, ER 100% PR 80% HER-2/neu negative.  Indication for treatment: curative  Radiation treatment dates: 11/27/2012-12/25/2012  Site/dose:  1) Left Breast / 40.05Gy in 15 fractions  2) Left Breast Boost / 10 Gy in 5 fractions  Beams/energy:  1) Opposed tangents / photons  2) En Face electrons / 15 MeV electrons   Narrative:  The patient returns today for routine follow-up. Doing well. Minimal complaints. Opted out of taking anti-estrogen pill.                             ALLERGIES:  is allergic to morphine sulfate.  Meds: Current Outpatient Prescriptions  Medication Sig Dispense Refill  . ALPRAZolam (XANAX) 0.5 MG tablet Take 0.5 mg by mouth 2 (two) times daily as needed. 1/2 to 1 tab po bid for anxiety       . aspirin 81 MG tablet Take 81 mg by mouth daily.      [name] CALCIUM-VITAMIN D PO Take 1 tablet by mouth 2 (two) times daily.        . cetirizine (ZYRTEC) 10 MG tablet Take 10 mg by mouth daily.        . cholecalciferol (VITAMIN D) 1000 UNITS tablet Take 1,000 Units by mouth daily.      . hyaluronate sodium (RADIAPLEXRX) GEL Apply 1 application topically 2 (two) times daily.      . naproxen sodium (ANAPROX) 220 MG tablet Take 220 mg by mouth as needed.      . Omega-3 Fatty Acids (OMEGA 3 PO) Take 3,000 mg by mouth daily.        [name] anastrozole (ARIMIDEX) 1 MG tablet Take 1 tablet (1 mg total) by mouth daily.  90 tablet  3   No current facility-administered medications for this encounter.    Physical Findings: The patient is in no acute distress. Patient is alert and oriented.  height is 5' 3.5" (1.613 m) and weight is 170 lb 3.2 oz (77.202 kg). Her  temperature is 98.1 F (36.7 C). Her blood pressure is 117/55 and her pulse is 73. .  Modest  Hyperpigmentation over inferior left breast. Skin intact  Lab Findings: Lab Results  Component Value Date   WBC 7.7 12/31/2012   HGB 12.9 12/31/2012   HCT 37.3 12/31/2012   MCV 87.3 12/31/2012   PLT 178 12/31/2012    Radiographic Findings: No results found.  Impression/Plan: Given Radiaplex for skin - can then transition to Vit E lotion.  I will order mammography for Jan 2015, then see me in early Feb 2015.  She knows to call if issues arise before then.  I spent 20 minutes minutes face to face with the patient and more than 50% of that time was spent in counseling and/or coordination of care. _____________________________________   Lonie Peak, MD

## 2013-02-06 ENCOUNTER — Telehealth: Payer: Self-pay | Admitting: *Deleted

## 2013-02-06 NOTE — Telephone Encounter (Signed)
Called patient to inform of test and fu visit, lvm for a return call 

## 2013-02-18 ENCOUNTER — Encounter (INDEPENDENT_AMBULATORY_CARE_PROVIDER_SITE_OTHER): Payer: Medicare Other | Admitting: Ophthalmology

## 2013-02-18 DIAGNOSIS — H353290 Exudative age-related macular degeneration, unspecified eye, stage unspecified: Secondary | ICD-10-CM

## 2013-02-18 DIAGNOSIS — H43819 Vitreous degeneration, unspecified eye: Secondary | ICD-10-CM

## 2013-02-18 DIAGNOSIS — H2510 Age-related nuclear cataract, unspecified eye: Secondary | ICD-10-CM

## 2013-02-25 ENCOUNTER — Other Ambulatory Visit: Payer: Medicare Other | Admitting: Lab

## 2013-02-25 ENCOUNTER — Other Ambulatory Visit (HOSPITAL_COMMUNITY): Payer: Medicare Other

## 2013-02-25 ENCOUNTER — Ambulatory Visit: Payer: Medicare Other | Admitting: Oncology

## 2013-04-08 ENCOUNTER — Other Ambulatory Visit: Payer: Self-pay | Admitting: Medical Oncology

## 2013-04-08 ENCOUNTER — Other Ambulatory Visit (HOSPITAL_BASED_OUTPATIENT_CLINIC_OR_DEPARTMENT_OTHER): Payer: Medicare Other | Admitting: Lab

## 2013-04-08 ENCOUNTER — Ambulatory Visit (HOSPITAL_BASED_OUTPATIENT_CLINIC_OR_DEPARTMENT_OTHER): Payer: Medicare Other | Admitting: Internal Medicine

## 2013-04-08 ENCOUNTER — Telehealth: Payer: Self-pay | Admitting: Internal Medicine

## 2013-04-08 VITALS — BP 134/64 | HR 79 | Temp 98.2°F | Resp 18 | Ht 63.5 in | Wt 170.4 lb

## 2013-04-08 DIAGNOSIS — C3490 Malignant neoplasm of unspecified part of unspecified bronchus or lung: Secondary | ICD-10-CM

## 2013-04-08 DIAGNOSIS — C3491 Malignant neoplasm of unspecified part of right bronchus or lung: Secondary | ICD-10-CM

## 2013-04-08 DIAGNOSIS — C50919 Malignant neoplasm of unspecified site of unspecified female breast: Secondary | ICD-10-CM

## 2013-04-08 DIAGNOSIS — Z803 Family history of malignant neoplasm of breast: Secondary | ICD-10-CM

## 2013-04-08 DIAGNOSIS — Z170 Estrogen receptor positive status [ER+]: Secondary | ICD-10-CM

## 2013-04-08 DIAGNOSIS — Z85118 Personal history of other malignant neoplasm of bronchus and lung: Secondary | ICD-10-CM

## 2013-04-08 DIAGNOSIS — C50912 Malignant neoplasm of unspecified site of left female breast: Secondary | ICD-10-CM

## 2013-04-08 LAB — COMPREHENSIVE METABOLIC PANEL (CC13)
ALT: 16 U/L (ref 0–55)
Albumin: 3.7 g/dL (ref 3.5–5.0)
CO2: 27 mEq/L (ref 22–29)
Calcium: 9.7 mg/dL (ref 8.4–10.4)
Chloride: 98 mEq/L (ref 98–109)
Potassium: 4.2 mEq/L (ref 3.5–5.1)
Sodium: 133 mEq/L — ABNORMAL LOW (ref 136–145)
Total Bilirubin: 0.26 mg/dL (ref 0.20–1.20)
Total Protein: 7 g/dL (ref 6.4–8.3)

## 2013-04-08 LAB — CBC WITH DIFFERENTIAL/PLATELET
Eosinophils Absolute: 0.1 10*3/uL (ref 0.0–0.5)
MONO#: 0.4 10*3/uL (ref 0.1–0.9)
NEUT#: 3.9 10*3/uL (ref 1.5–6.5)
RBC: 4.2 10*6/uL (ref 3.70–5.45)
RDW: 12.7 % (ref 11.2–14.5)
WBC: 5.5 10*3/uL (ref 3.9–10.3)
lymph#: 1.1 10*3/uL (ref 0.9–3.3)
nRBC: 0 % (ref 0–0)

## 2013-04-08 LAB — LACTATE DEHYDROGENASE (CC13): LDH: 125 U/L (ref 125–245)

## 2013-04-08 NOTE — Telephone Encounter (Signed)
gave pt appt for lab and Md and genetic consult on November 2014

## 2013-04-08 NOTE — Progress Notes (Signed)
Hematology and Oncology Follow Up Visit  Sandra Ferguson [id number] [birth year] 79 y.o. 04/08/2013 9:51 PM RAMACHANDRAN,[name], MD  Principle Diagnosis:  1) Small cell carcinoma of the right upper lobe (stage IA) status post right upper lobectomy on 09/01/2006 by Dr. Norton Blizzard. 2) Invasive carcinoma of the left breast detected by mammogram at [facility] on 08/06/2012.  Prior Therapy:   1) For her SCC of RUL, she received 4 cycles of adjuvant carboplatin VP-16 in combination with Neulasta from 10/03/2006 through 12/05/2006. Now NED. 2) For her Breast cancer, she underwent definitive surgery by Almond Lint, on 08/21/2012. This consisted of a lumpectomy and sentinel node sampling. The tumor was 1.9 cm, grade II/III. Closest margin was 1 mm at the superior margin and 2 mm at the posterior margin. Both sentinel lymph nodes were negative. Pathologic stage was pTIc, pN0, IA. The patient completed radiation treatments to the left breast on 12/25/2012.  I believe she received a total dose of 5005 cGy.    Current therapy: Arimidex 1 mg daily was initiated on 12/31/2012 but stopped one week later because of non-specific symptoms including fatigue, malaise.   Interim History:  Sandra Ferguson is being seen today for followup of her invasive ductal carcinoma of the left breast detected on routine screening at [facility] on 08/06/2012.  Estrogen receptor is strongly positive.  Pathologic stage was T1c, IA.  She was last seen by [facility] on 12/31/2012.  She is here today accompanied by her daughter, Sandra Ferguson.  Patient is complaining of nasal congestion without coughing but denies fever or chills.  She attributes this to allergies.  Otherwise, today she is without complaints.  She states that she does want to take aridex because if just made her feel lousy overall.    Medications: I have reviewed the patient's current medications.  Current Outpatient Prescriptions  Medication Sig Dispense Refill  .  ALPRAZolam (XANAX) 0.5 MG tablet Take 0.5 mg by mouth 2 (two) times daily as needed. 1/2 to 1 tab po bid for anxiety       . aspirin 81 MG tablet Take 81 mg by mouth daily.      [name] CALCIUM-VITAMIN D PO Take 1 tablet by mouth 2 (two) times daily.        . cetirizine (ZYRTEC) 10 MG tablet Take 10 mg by mouth daily.        . cholecalciferol (VITAMIN D) 1000 UNITS tablet Take 1,000 Units by mouth daily.      . hyaluronate sodium (RADIAPLEXRX) GEL Apply 1 application topically 2 (two) times daily.      . naproxen sodium (ANAPROX) 220 MG tablet Take 220 mg by mouth as needed.      . Omega-3 Fatty Acids (OMEGA 3 PO) Take 3,000 mg by mouth daily.         No current facility-administered medications for this visit.     Allergies:  Allergies  Allergen Reactions  . Morphine Sulfate Shortness Of Breath    hallucinations    Past Medical History, Surgical history, Social history, and Family History were reviewed and updated.  IMMUNIZATIONS:  The patient does not know if she has ever had a pneumonia shot. A flu shot was given in the fall of 2013.  SMOKING HISTORY: Patient smoked up to 2 packs of cigarettes a day for 40 years. She stopped smoking in 1985.  Review of Systems: Constitutional:  Negative for fever, chills, night sweats, anorexia, weight loss, pain. Cardiovascular: no chest pain or  dyspnea on exertion Respiratory: no cough, shortness of breath, or wheezing positive for - congestion negative for - cough or pleuritic pain Neurological: no TIA or stroke symptoms Dermatological: negative for rash ENT: negative for - epistaxis Skin: Negative. Gastrointestinal: no abdominal pain, change in bowel habits, or black or bloody stools Genito-Urinary: no dysuria, trouble voiding, or hematuria Hematological and Lymphatic: negative for - bleeding problems, bruising or fatigue Breast: negative for breast lumps Musculoskeletal: negative for - gait disturbance Remaining ROS negative. Physical  Exam: Blood pressure 134/64, pulse 79, temperature 98.2 F (36.8 C), temperature source Oral, resp. rate 18, height 5' 3.5" (1.613 m), weight 170 lb 6.4 oz (77.293 kg), SpO2 97.00%. ECOG: 1 General appearance: alert, cooperative, appears stated age and no distress Head: Normocephalic, without obvious abnormality, atraumatic HEENT: PERRLA; EOMi; OP clear; no scleral icterus.  Neck: no adenopathy, supple, symmetrical, trachea midline and thyroid not enlarged, symmetric, no tenderness/mass/nodules Lymph nodes: Cervical, supraclavicular, and axillary nodes normal. Heart:regular rate and rhythm, S1, S2 normal, no murmur, click, rub or gallop Lung:chest clear, no wheezing, rales, normal symmetric air entry, Heart exam - S1, S2 normal, no murmur, no gallop, rate regular Breast: Left breast smaller than right but both breast without masses. No axillary adenopathy.  Abdomin: soft, non-tender, without masses or organomegaly EXT:No peripheral edema  Lab Results: Lab Results  Component Value Date   WBC 5.5 04/08/2013   HGB 12.9 04/08/2013   HCT 36.3 04/08/2013   MCV 86.4 04/08/2013   PLT 182 04/08/2013     Chemistry      Component Value Date/Time   NA 133* 04/08/2013 1404   NA 134* 02/27/2012 1401   K 4.2 04/08/2013 1404   K 4.3 02/27/2012 1401   CL 95* 12/31/2012 1327   CL 97 02/27/2012 1401   CO2 27 04/08/2013 1404   CO2 26 02/27/2012 1401   BUN 10.8 04/08/2013 1404   BUN 8 02/27/2012 1401   CREATININE 0.7 04/08/2013 1404   CREATININE 0.64 02/27/2012 1401      Component Value Date/Time   CALCIUM 9.7 04/08/2013 1404   CALCIUM 10.1 02/27/2012 1401   ALKPHOS 55 04/08/2013 1404   ALKPHOS 44 02/27/2012 1401   AST 21 04/08/2013 1404   AST 16 02/27/2012 1401   ALT 16 04/08/2013 1404   ALT 11 02/27/2012 1401   BILITOT 0.26 04/08/2013 1404   BILITOT 0.3 02/27/2012 1401     Radiological Studies: 1. CT scan of the chest with IV contrast on 01/08/2009 showed no significant changes compared with the prior exam of 11/29/2007.   There were some tiny nodules in both lungs that were unchanged.  2. Chest x-ray from 08/04/2009 showed no abnormalities or evidence for metastatic disease.  3. A CT scan of the chest with IV contrast from 12/08/2009 showed no evidence for metastatic disease.  4. Two-view chest x-ray from 12/09/2010 showed no active cardiopulmonary disease.  5. Bilateral digital screening mammograms from 01/10/2011 carried out at [facility] concluded that the right breast demonstrated no abnormalities. Additional imaging of the left breast was recommended and was subsequently negative.  6. Unilateral left breast mammogram was carried out at [facility] on 08/06/2012. There was a 4 x 9 mm hypoechoic mass seen at the 2:00 position located 2 cm from the nipple. Biopsy was recommended.  7. Chest x-ray, 2 view, from 02/20/2012 showed surgical changes of a prior right upper lobectomy with no suspicious findings or evidence of acute  cardiopulmonary disease.  8.  Chest x-ray, 2 view, from 10/24/2012 showed stable postoperative appearance of the right lung and no acute cardiopulmonary abnormalities. 9. Bone density scan carried out at the [facility] [place] on 11/13/2012 showed a T-score of the lumbar spine of -1.7.  T-score of the left hip was -2.5.  No prior scans were available for comparison.  Impression and Plan: 1. Breast cancer, left.  -Sandra Ferguson was given a prescription for Arimidex during her last visit.  She stopped it herself because of complaints including fatigue and general malaise and overall making her feel "lousy".  We discussed the benefits for this therapy included preventing local recurrence or occurrence in the contralateral risk.  We also discussed potential alternative anti-estrogens.  She understood and declined based on her wishes to avoid additional medicines at her age.  Her daughter was present throughout the conversation.  We reinterated that should she develop  questions concerning adjuvant hormonal therapy, she could call our office.  -During last visit, she was referred to genetic counseling due a strong history of breast cancer in the family.  She was agreeable to repeat referral and will follow-up.  -Again, she has been cautioned about osteoporosis and falls in the elderly.  She will continue on vitamin d supplementation. She will need a followup bone density scan in a couple of years. - RTC in 3 months, at which time we will check CBC and chemistries.   Spent more than half the time coordinating care.    Juvia Aerts, MD 9/8/20149:51 PM

## 2013-04-18 ENCOUNTER — Encounter (INDEPENDENT_AMBULATORY_CARE_PROVIDER_SITE_OTHER): Payer: Medicare Other | Admitting: Ophthalmology

## 2013-04-18 DIAGNOSIS — H35439 Paving stone degeneration of retina, unspecified eye: Secondary | ICD-10-CM

## 2013-04-18 DIAGNOSIS — H2510 Age-related nuclear cataract, unspecified eye: Secondary | ICD-10-CM

## 2013-04-18 DIAGNOSIS — H353290 Exudative age-related macular degeneration, unspecified eye, stage unspecified: Secondary | ICD-10-CM

## 2013-04-18 DIAGNOSIS — H43819 Vitreous degeneration, unspecified eye: Secondary | ICD-10-CM

## 2013-06-05 ENCOUNTER — Encounter (INDEPENDENT_AMBULATORY_CARE_PROVIDER_SITE_OTHER): Payer: Medicare Other | Admitting: Ophthalmology

## 2013-06-05 DIAGNOSIS — H2510 Age-related nuclear cataract, unspecified eye: Secondary | ICD-10-CM

## 2013-06-05 DIAGNOSIS — H43819 Vitreous degeneration, unspecified eye: Secondary | ICD-10-CM

## 2013-06-05 DIAGNOSIS — H353290 Exudative age-related macular degeneration, unspecified eye, stage unspecified: Secondary | ICD-10-CM

## 2013-06-10 ENCOUNTER — Encounter: Payer: Medicare Other | Admitting: Genetic Counselor

## 2013-06-10 ENCOUNTER — Other Ambulatory Visit: Payer: Medicare Other | Admitting: Lab

## 2013-07-08 ENCOUNTER — Telehealth: Payer: Self-pay | Admitting: Internal Medicine

## 2013-07-08 ENCOUNTER — Ambulatory Visit (HOSPITAL_BASED_OUTPATIENT_CLINIC_OR_DEPARTMENT_OTHER): Payer: Medicare Other | Admitting: Internal Medicine

## 2013-07-08 ENCOUNTER — Other Ambulatory Visit (HOSPITAL_BASED_OUTPATIENT_CLINIC_OR_DEPARTMENT_OTHER): Payer: Medicare Other | Admitting: Lab

## 2013-07-08 VITALS — BP 142/62 | HR 78 | Temp 97.9°F | Resp 18 | Ht 63.5 in | Wt 170.2 lb

## 2013-07-08 DIAGNOSIS — C50919 Malignant neoplasm of unspecified site of unspecified female breast: Secondary | ICD-10-CM

## 2013-07-08 DIAGNOSIS — Z85118 Personal history of other malignant neoplasm of bronchus and lung: Secondary | ICD-10-CM

## 2013-07-08 DIAGNOSIS — M810 Age-related osteoporosis without current pathological fracture: Secondary | ICD-10-CM

## 2013-07-08 DIAGNOSIS — C3491 Malignant neoplasm of unspecified part of right bronchus or lung: Secondary | ICD-10-CM

## 2013-07-08 DIAGNOSIS — C50912 Malignant neoplasm of unspecified site of left female breast: Secondary | ICD-10-CM

## 2013-07-08 LAB — COMPREHENSIVE METABOLIC PANEL (CC13)
ALT: 14 U/L (ref 0–55)
Albumin: 4 g/dL (ref 3.5–5.0)
Alkaline Phosphatase: 51 U/L (ref 40–150)
Anion Gap: 10 mEq/L (ref 3–11)
CO2: 27 mEq/L (ref 22–29)
Chloride: 96 mEq/L — ABNORMAL LOW (ref 98–109)
Glucose: 101 mg/dl (ref 70–140)
Potassium: 4.4 mEq/L (ref 3.5–5.1)
Sodium: 133 mEq/L — ABNORMAL LOW (ref 136–145)
Total Protein: 6.9 g/dL (ref 6.4–8.3)

## 2013-07-08 LAB — CBC WITH DIFFERENTIAL/PLATELET
BASO%: 0.6 % (ref 0.0–2.0)
EOS%: 3.7 % (ref 0.0–7.0)
HCT: 36.7 % (ref 34.8–46.6)
MCH: 30.2 pg (ref 25.1–34.0)
MCHC: 34.3 g/dL (ref 31.5–36.0)
NEUT%: 58.1 % (ref 38.4–76.8)
lymph#: 1.4 10*3/uL (ref 0.9–3.3)

## 2013-07-08 NOTE — Telephone Encounter (Signed)
gv and printed appt sched and avs for pt for April 2015  °

## 2013-07-09 NOTE — Progress Notes (Signed)
Hematology and Oncology Follow Up Visit  Sandra Ferguson [id number] Oct 16, 1926 79 y.o. 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 Norton Blizzard. 2) Invasive carcinoma of the left breast detected by mammogram at [facility] on 08/06/2012.  Prior Therapy:   1) For her SCC of RUL, she received 4 cycles of adjuvant carboplatin VP-16 in combination with Neulasta from 10/03/2006 through 12/05/2006. Now NED. 2) For her Breast cancer, she underwent definitive surgery by Almond Lint, on 08/21/2012. This consisted of a lumpectomy and sentinel node sampling. The tumor was 1.9 cm, grade II/III. Closest margin was 1 mm at the superior margin and 2 mm at the posterior margin. Both sentinel lymph nodes were negative. Pathologic stage was pTIc, pN0, IA. The patient completed radiation treatments to the left breast on 12/25/2012.  I believe she received a total dose of 5005 cGy.    Current therapy: Arimidex 1 mg daily was initiated on 12/31/2012 but stopped one week later because of non-specific symptoms including fatigue, malaise.   Interim History:  Sandra Ferguson is being seen today for followup of her invasive ductal carcinoma of the left breast detected on routine screening at [facility] on 08/06/2012.  Estrogen receptor is strongly positive.  Pathologic stage was T1c, IA.  She was last seen by me on 04/08/2013.  She is here today accompanied by her daughter, Sandra Ferguson.  Patient is doing well today and is without complaints.   She states that she does want to take aridex because if just made her feel lousy overall.    Medications: I have reviewed the patient's current medications.  Current Outpatient Prescriptions  Medication Sig Dispense Refill  . ALPRAZolam (XANAX) 0.5 MG tablet Take 0.5 mg by mouth 2 (two)  times daily as needed. 1/2 to 1 tab po bid for anxiety       . aspirin 81 MG tablet Take 81 mg by mouth daily.      [name] CALCIUM-VITAMIN D PO Take 1 tablet by mouth 2 (two) times daily.        . cetirizine (ZYRTEC) 10 MG tablet Take 10 mg by mouth daily.        . cholecalciferol (VITAMIN D) 1000 UNITS tablet Take 1,000 Units by mouth daily.      . naproxen sodium (ANAPROX) 220 MG tablet Take 220 mg by mouth as needed.      . Omega-3 Fatty Acids (OMEGA 3 PO) Take 3,000 mg by mouth daily.        . polyethylene glycol (MIRALAX / GLYCOLAX) packet Take 17 g by mouth daily as needed.       No current facility-administered medications for this visit.   Allergies:  Allergies  Allergen Reactions  . Morphine Sulfate Shortness Of Breath    hallucinations    Past Medical History, Surgical history, Social history, and Family History were reviewed and updated.  IMMUNIZATIONS:  The patient does not know if she has ever had a pneumonia shot. A flu shot was given in the fall of 2013.  SMOKING HISTORY: Patient smoked up to 2 packs of cigarettes a day for 40 years. She stopped smoking in 1985.  Review of Systems: Constitutional:  Negative for fever, chills, night sweats, anorexia, weight loss, pain. Cardiovascular: no chest pain or dyspnea on exertion Respiratory: no  cough, shortness of breath, or wheezing positive for - congestion negative for - cough or pleuritic pain Neurological: no TIA or stroke symptoms Dermatological: negative for rash ENT: negative for - epistaxis Skin: Negative. Gastrointestinal: no abdominal pain, change in bowel habits, or black or bloody stools Genito-Urinary: no dysuria, trouble voiding, or hematuria Hematological and Lymphatic: negative for - bleeding problems, bruising or fatigue Breast: negative for breast lumps Musculoskeletal: negative for - gait disturbance Remaining ROS negative. Physical Exam: Blood pressure 142/62, pulse 78, temperature 97.9 F (36.6 C),  temperature source Oral, resp. rate 18, height 5' 3.5" (1.613 m), weight 170 lb 3.2 oz (77.202 kg). ECOG: 1 General appearance: alert, cooperative, appears stated age and no distress Head: Normocephalic, without obvious abnormality, atraumatic HEENT: PERRLA; EOMi; OP clear; no scleral icterus.  Neck: no adenopathy, supple, symmetrical, trachea midline and thyroid not enlarged, symmetric, no tenderness/mass/nodules Lymph nodes: Cervical, supraclavicular, and axillary nodes normal. Heart:regular rate and rhythm, S1, S2 normal, no murmur, click, rub or gallop Lung:chest clear, no wheezing, rales, normal symmetric air entry, Heart exam - S1, S2 normal, no murmur, no gallop, rate regular Breast: Left breast smaller than right but both breast without masses. No axillary adenopathy.  Abdomin: soft, non-tender, without masses or organomegaly EXT:No peripheral edema and clubbing Neurological; No focal deficits, gait cautious and strength and sensation equal bilaterally  Lab Results: Lab Results  Component Value Date   WBC 4.9 07/08/2013   HGB 12.6 07/08/2013   HCT 36.7 07/08/2013   MCV 88.0 07/08/2013   PLT 185 07/08/2013     Chemistry      Component Value Date/Time   NA 133* 07/08/2013 1509   NA 134* 02/27/2012 1401   K 4.4 07/08/2013 1509   K 4.3 02/27/2012 1401   CL 95* 12/31/2012 1327   CL 97 02/27/2012 1401   CO2 27 07/08/2013 1509   CO2 26 02/27/2012 1401   BUN 10.9 07/08/2013 1509   BUN 8 02/27/2012 1401   CREATININE 0.7 07/08/2013 1509   CREATININE 0.64 02/27/2012 1401      Component Value Date/Time   CALCIUM 10.0 07/08/2013 1509   CALCIUM 10.1 02/27/2012 1401   ALKPHOS 51 07/08/2013 1509   ALKPHOS 44 02/27/2012 1401   AST 22 07/08/2013 1509   AST 16 02/27/2012 1401   ALT 14 07/08/2013 1509   ALT 11 02/27/2012 1401   BILITOT 0.24 07/08/2013 1509   BILITOT 0.3 02/27/2012 1401     Radiological Studies: 1. CT scan of the chest with IV contrast on 01/08/2009 showed no significant changes compared  with the prior exam of 11/29/2007.  There were some tiny nodules in both lungs that were unchanged.  2. Chest x-ray from 08/04/2009 showed no abnormalities or evidence for metastatic disease.  3. A CT scan of the chest with IV contrast from 12/08/2009 showed no evidence for metastatic disease.  4. Two-view chest x-ray from 12/09/2010 showed no active cardiopulmonary disease.  5. Bilateral digital screening mammograms from 01/10/2011 carried out at [facility] concluded that the right breast demonstrated no abnormalities. Additional imaging of the left breast was recommended and was subsequently negative.  6. Unilateral left breast mammogram was carried out at [facility] on 08/06/2012. There was a 4 x 9 mm hypoechoic mass seen at the 2:00 position located 2 cm from the nipple. Biopsy was recommended.  7. Chest x-ray, 2 view, from 02/20/2012 showed surgical changes of a prior right upper lobectomy with no suspicious findings or evidence  of acute cardiopulmonary disease.  8. Chest x-ray, 2 view, from 10/24/2012 showed stable postoperative appearance of the right lung and no acute cardiopulmonary abnormalities. 9. Bone density scan carried out at the [facility] [place] on 11/13/2012 showed a T-score of the lumbar spine of -1.7.  T-score of the left hip was -2.5.  No prior scans were available for comparison.  Impression and Plan:  1. Breast cancer, left.  -Mrs. Scherzer was given a prescription for Arimidex on prior visits.   She stopped it herself because of complaints including fatigue and general malaise and overall making her feel "lousy".  We discussed the benefits for this therapy included preventing local recurrence or occurrence in the contralateral risk.  We also discussed potential alternative anti-estrogens.  She understood and declined based on her wishes to avoid additional medicines at her age.  Her daughter was present throughout the conversation.  We reinterated  that should she develop questions concerning adjuvant hormonal therapy, she could call our office.  -During last visit, she was referred to genetic counseling due a strong history of breast cancer in the family.  She was agreeable to repeat referral.   2. SCC of RUL. --She received 4 cycles of adjuvant carboplatin VP-16 in combination with Neulasta from 10/03/2006 through 12/05/2006. Now NED.  3. Osteoporosis and falls in the elderly.   --She will continue on vitamin d supplementation. She will need a followup bone density scan in a couple of years.  4. Follow-up. - RTC in 3 months, at which time we will check CBC and chemistries.   Spent more than half the time coordinating care.    Zyana Amaro, MD 12/9/20143:21 PM

## 2013-07-29 ENCOUNTER — Encounter (INDEPENDENT_AMBULATORY_CARE_PROVIDER_SITE_OTHER): Payer: Medicare Other | Admitting: Ophthalmology

## 2013-07-29 DIAGNOSIS — H353190 Nonexudative age-related macular degeneration, unspecified eye, stage unspecified: Secondary | ICD-10-CM

## 2013-07-29 DIAGNOSIS — H43819 Vitreous degeneration, unspecified eye: Secondary | ICD-10-CM

## 2013-09-04 NOTE — Progress Notes (Signed)
  Radiation Oncology         (336) [phone]  ________________________________  Name: Sandra Ferguson MRN: [id number]  Date: 09/06/2013  DOB: 26-Oct-1926  Follow-Up Visit Note  outpatient  CC: Merrilee Seashore, MD  Merrilee Seashore, MD  Diagnosis and Prior Radiotherapy:  Left Breast invasive ductal carcinoma, T1c N0 M0, grade 2, ER 100% PR 80% HER-2/neu negative.    Radiation treatment dates: 11/27/2012-12/25/2012  Site/dose:  1) Left Breast / 40.05Gy in 15 fractions  2) Left Breast Boost / 10 Gy in 5 fractions     Narrative:  The patient returns today for routine follow-up.  No palpable lumps or bumps in her breasts. Mammogram at [facility] on 1-29 was BIRADS 3;  6 mo f/u left mammogram is recommended for calcifications.      She is not taking any anti-hormonal medications.   QOL better after cataract surgery which allows her to drive. Uses walker.  ALLERGIES:  is allergic to morphine sulfate.  Meds: Current Outpatient Prescriptions  Medication Sig Dispense Refill  . ALPRAZolam (XANAX) 0.5 MG tablet Take 0.5 mg by mouth 2 (two) times daily as needed. 1/2 to 1 tab po bid for anxiety       . aspirin 81 MG tablet Take 81 mg by mouth daily.      [name] CALCIUM-VITAMIN D PO Take 1 tablet by mouth 2 (two) times daily.        . cetirizine (ZYRTEC) 10 MG tablet Take 10 mg by mouth daily.        . cholecalciferol (VITAMIN D) 1000 UNITS tablet Take 1,000 Units by mouth daily.      . naproxen sodium (ANAPROX) 220 MG tablet Take 220 mg by mouth as needed.      . Omega-3 Fatty Acids (OMEGA 3 PO) Take 3,000 mg by mouth daily.        . polyethylene glycol (MIRALAX / GLYCOLAX) packet Take 17 g by mouth daily as needed.       No current facility-administered medications for this encounter.    Physical Findings: The patient is in no acute distress. Patient is alert and oriented.  height is 5' 3.5" (1.613 m) and weight is 172 lb 8 oz (78.245 kg). Her temperature is 97.4 F (36.3 C). Her blood pressure  is 125/59 and her pulse is 84. [name]   No palpable lumps or bumps in either breast. Some mild residual hyperpigmentation is in the left breast from radiotherapy. No cervical, supraclavicular, or axillary lymphadenopathy palpated   Lab Findings: Lab Results  Component Value Date   WBC 4.9 07/08/2013   HGB 12.6 07/08/2013   HCT 36.7 07/08/2013   MCV 88.0 07/08/2013   PLT 185 07/08/2013    Radiographic Findings: No results found.  Impression/Plan:  Will order followup six-month mammogram for the calcifications in the left breast and followup with me at that time. She and her daughter are pleased with this plan. She will call if she has any concerns or questions before then  I spent 20 minutes face to face with the patient and more than 50% of that time was spent in counseling and/or coordination of care. _____________________________________   Eppie Gibson, MD

## 2013-09-06 ENCOUNTER — Encounter: Payer: Self-pay | Admitting: Radiation Oncology

## 2013-09-06 ENCOUNTER — Ambulatory Visit
Admission: RE | Admit: 2013-09-06 | Discharge: 2013-09-06 | Disposition: A | Discharge status: Home / Self Care | Payer: Medicare Other | Source: Ambulatory Visit | Attending: Radiation Oncology | Admitting: Radiation Oncology

## 2013-09-06 VITALS — BP 125/59 | HR 84 | Temp 97.4°F | Ht 63.5 in | Wt 172.5 lb

## 2013-09-06 DIAGNOSIS — C50912 Malignant neoplasm of unspecified site of left female breast: Secondary | ICD-10-CM

## 2013-09-06 NOTE — Progress Notes (Signed)
Ms. Depp has no voiced concerns today.

## 2013-09-09 ENCOUNTER — Telehealth: Payer: Self-pay | Admitting: *Deleted

## 2013-09-09 NOTE — Telephone Encounter (Signed)
Called patient to inform of test and fu - spoke with patient and she is aware of these appts.

## 2013-09-18 ENCOUNTER — Encounter (INDEPENDENT_AMBULATORY_CARE_PROVIDER_SITE_OTHER): Payer: Medicare Other | Admitting: Ophthalmology

## 2013-09-26 ENCOUNTER — Encounter (INDEPENDENT_AMBULATORY_CARE_PROVIDER_SITE_OTHER): Payer: Medicare Other | Admitting: Ophthalmology

## 2013-10-02 ENCOUNTER — Encounter (INDEPENDENT_AMBULATORY_CARE_PROVIDER_SITE_OTHER): Payer: Medicare Other | Admitting: Ophthalmology

## 2013-10-02 DIAGNOSIS — H43819 Vitreous degeneration, unspecified eye: Secondary | ICD-10-CM

## 2013-10-02 DIAGNOSIS — H353290 Exudative age-related macular degeneration, unspecified eye, stage unspecified: Secondary | ICD-10-CM

## 2013-11-08 ENCOUNTER — Ambulatory Visit: Payer: Medicare Other

## 2013-11-08 ENCOUNTER — Other Ambulatory Visit: Payer: Medicare Other

## 2013-12-04 ENCOUNTER — Encounter (INDEPENDENT_AMBULATORY_CARE_PROVIDER_SITE_OTHER): Payer: Medicare Other | Admitting: Ophthalmology

## 2013-12-19 ENCOUNTER — Encounter (INDEPENDENT_AMBULATORY_CARE_PROVIDER_SITE_OTHER): Payer: Medicare Other | Admitting: Ophthalmology

## 2013-12-19 DIAGNOSIS — H353290 Exudative age-related macular degeneration, unspecified eye, stage unspecified: Secondary | ICD-10-CM

## 2014-02-12 ENCOUNTER — Encounter (INDEPENDENT_AMBULATORY_CARE_PROVIDER_SITE_OTHER): Payer: Medicare Other | Admitting: Ophthalmology

## 2014-02-12 DIAGNOSIS — H353290 Exudative age-related macular degeneration, unspecified eye, stage unspecified: Secondary | ICD-10-CM

## 2014-02-12 DIAGNOSIS — H43819 Vitreous degeneration, unspecified eye: Secondary | ICD-10-CM

## 2014-03-07 ENCOUNTER — Ambulatory Visit
Admission: RE | Admit: 2014-03-07 | Discharge: 2014-03-07 | Disposition: A | Discharge status: Home / Self Care | Payer: Medicare Other | Source: Ambulatory Visit | Attending: Radiation Oncology | Admitting: Radiation Oncology

## 2014-03-07 ENCOUNTER — Encounter: Payer: Self-pay | Admitting: Radiation Oncology

## 2014-03-07 VITALS — BP 124/56 | HR 82 | Temp 97.8°F | Ht 63.5 in | Wt 165.8 lb

## 2014-03-07 DIAGNOSIS — C50412 Malignant neoplasm of upper-outer quadrant of left female breast: Secondary | ICD-10-CM

## 2014-03-07 DIAGNOSIS — C50419 Malignant neoplasm of upper-outer quadrant of unspecified female breast: Secondary | ICD-10-CM | POA: Insufficient documentation

## 2014-03-07 NOTE — Progress Notes (Signed)
Sandra Ferguson here for follow up after treatment to her left breast.  She denies pain except in her right knee with activity.  She reports that she needs a knee replacement.  She is using a cane for ambulation.  She reports fatigue with activity.  The skin on her left breast is intact.  She reports that the skin is rough around the scar area.  Gave her samples of Aquaphor to try.

## 2014-03-07 NOTE — Progress Notes (Signed)
Radiation Oncology         (336) [phone] ________________________________  Name: Sandra Ferguson MRN: [id number]  Date: 03/07/2014  DOB: 1927-06-28  Follow-Up Visit Note  Outpatient  CC: Merrilee Seashore, MD  Stark Klein, MD  Diagnosis and Prior Radiotherapy:   Left Breast invasive ductal carcinoma, T1c N0 M0, grade 2, ER 100% PR 80% HER-2/neu negative.  Radiation treatment dates: 11/27/2012-12/25/2012  Site/dose:  1) Left Breast / 40.05Gy in 15 fractions  2) Left Breast Boost / 10 Gy in 5 fractions    Narrative:  The patient returns today for routine follow-up. She denies pain except in her right knee with activity. She reports that she needs a knee replacement. She is using a cane for ambulation. She reports fatigue with activity. The skin on her left breast is intact. She reports that the skin is rough around the scar area. Gave her samples of Aquaphor to try. Not on hormonal therapy. She is inquiring about f/u with other specialists- none scheduled, although their notes recommended it.                              ALLERGIES:  is allergic to morphine sulfate.  Meds: Current Outpatient Prescriptions  Medication Sig Dispense Refill  . ALPRAZolam (XANAX) 0.5 MG tablet Take 0.5 mg by mouth 2 (two) times daily as needed. 1/2 to 1 tab po bid for anxiety       . aspirin 81 MG tablet Take 81 mg by mouth daily.      [name] CALCIUM-VITAMIN D PO Take 1 tablet by mouth 2 (two) times daily.        . cetirizine (ZYRTEC) 10 MG tablet Take 10 mg by mouth daily.        . cholecalciferol (VITAMIN D) 1000 UNITS tablet Take 1,000 Units by mouth daily.      . naproxen sodium (ANAPROX) 220 MG tablet Take 220 mg by mouth as needed.      . Omega-3 Fatty Acids (OMEGA 3 PO) Take 3,000 mg by mouth daily.        . polyethylene glycol (MIRALAX / GLYCOLAX) packet Take 17 g by mouth daily as needed.       No current facility-administered medications for this encounter.    Physical Findings: The patient is in  no acute distress. Patient is alert and oriented.  height is 5' 3.5" (1.613 m) and weight is 165 lb 12.8 oz (75.206 kg). Her oral temperature is 97.8 F (36.6 C). Her blood pressure is 124/56 and her pulse is 82. .    Neck - no adenopathy Breasts - no lesions of concern Axillae - negative  Lab Findings: Lab Results  Component Value Date   WBC 4.9 07/08/2013   HGB 12.6 07/08/2013   HCT 36.7 07/08/2013   MCV 88.0 07/08/2013   PLT 185 07/08/2013    Radiographic Findings: No results found.  Impression/Plan:  F/u in 1 year.  Patient was lost to f/u with Dr. Barry Dienes. I will arrange a f/u with her after next mammogram.  Pt also lost to f/u with med/onc. I will ask med/onc administration to address this.  She reports that the skin is rough around the scar area. Gave her samples of Aquaphor to try.    I have encouraged her to call if she has any issues or concerns in the future. I wished her the very best until I see her again.   _____________________________________  Eppie Gibson, MD

## 2014-03-10 ENCOUNTER — Telehealth: Payer: Self-pay | Admitting: *Deleted

## 2014-03-10 NOTE — Telephone Encounter (Signed)
Spoke with Dr. [facility] and their schedule is not open in Jan. Yet, they will call when that schedule opens up and schedule her fu with Dr. Barry Dienes after her mammogram on 08-04-14 @ 2:15 pm @ [facility]

## 2014-03-10 NOTE — Telephone Encounter (Signed)
Called patient to inform of mammogram on 08-04-14- arrival time - 2 pm @ [facility] and her fu visit on 03-13-15 @ 2 pm with Dr. Isidore Moos, I also informed her that Dr. [facility] should be contacting her sometime in the near future about a follow-up visit with Dr. Barry Dienes , I informed her that the books aren't open at this time this far out, they should call sometime in December to arrange this appt. For her after her mammogram on 08-04-14, patient verified understanding this.

## 2014-03-10 NOTE — Telephone Encounter (Signed)
Called patient to inform of mammogram on 08/04/14- arrival time - 2:00 pm @ [facility]

## 2014-03-24 ENCOUNTER — Telehealth: Payer: Self-pay | Admitting: *Deleted

## 2014-03-24 NOTE — Telephone Encounter (Signed)
Received request from Dr. Isidore Moos to get pt established with a new provider since Dr. Ralene Ok is no longer with [place].  Called pt and confirmed 04/10/14 appt.  Mailed calendar to pt.  Emailed Dr. Isidore Moos to make her aware.

## 2014-04-08 ENCOUNTER — Telehealth: Payer: Self-pay | Admitting: Hematology and Oncology

## 2014-04-08 NOTE — Telephone Encounter (Signed)
returned pt call she need to r/s due to daughter only off on fridays...done...pt awareof new d.t

## 2014-04-10 ENCOUNTER — Ambulatory Visit: Payer: Medicare Other | Admitting: Hematology and Oncology

## 2014-04-11 ENCOUNTER — Ambulatory Visit: Payer: Medicare Other | Admitting: Hematology and Oncology

## 2014-04-16 ENCOUNTER — Encounter (INDEPENDENT_AMBULATORY_CARE_PROVIDER_SITE_OTHER): Payer: Medicare Other | Admitting: Ophthalmology

## 2014-04-16 DIAGNOSIS — H353290 Exudative age-related macular degeneration, unspecified eye, stage unspecified: Secondary | ICD-10-CM

## 2014-04-16 DIAGNOSIS — H43819 Vitreous degeneration, unspecified eye: Secondary | ICD-10-CM

## 2014-05-02 ENCOUNTER — Ambulatory Visit: Payer: Medicare Other | Admitting: Hematology and Oncology

## 2014-05-02 ENCOUNTER — Telehealth: Payer: Self-pay | Admitting: Hematology and Oncology

## 2014-05-02 NOTE — Telephone Encounter (Signed)
, °

## 2014-05-08 ENCOUNTER — Telehealth: Payer: Self-pay | Admitting: Adult Health

## 2014-05-08 NOTE — Telephone Encounter (Signed)
, °

## 2014-05-27 ENCOUNTER — Ambulatory Visit: Payer: Medicare Other | Admitting: Hematology and Oncology

## 2014-06-13 ENCOUNTER — Ambulatory Visit (HOSPITAL_BASED_OUTPATIENT_CLINIC_OR_DEPARTMENT_OTHER): Payer: Medicare Other | Admitting: Adult Health

## 2014-06-13 ENCOUNTER — Telehealth: Payer: Self-pay | Admitting: Adult Health

## 2014-06-13 VITALS — BP 149/63 | HR 67 | Temp 98.0°F | Resp 18 | Ht 63.5 in | Wt 167.0 lb

## 2014-06-13 DIAGNOSIS — C3491 Malignant neoplasm of unspecified part of right bronchus or lung: Secondary | ICD-10-CM

## 2014-06-13 DIAGNOSIS — C50912 Malignant neoplasm of unspecified site of left female breast: Secondary | ICD-10-CM

## 2014-06-13 DIAGNOSIS — Z853 Personal history of malignant neoplasm of breast: Secondary | ICD-10-CM

## 2014-06-13 DIAGNOSIS — Z85118 Personal history of other malignant neoplasm of bronchus and lung: Secondary | ICD-10-CM

## 2014-06-13 DIAGNOSIS — M810 Age-related osteoporosis without current pathological fracture: Secondary | ICD-10-CM

## 2014-06-13 NOTE — Progress Notes (Signed)
Hematology and Oncology Follow Up Visit  Sandra Ferguson [id number] October 05, 1926 80 y.o. 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 Pierre Bali. 2) Invasive carcinoma of the left breast detected by mammogram at [facility] on 08/06/2012.  Prior Therapy:   1) For her SCC of RUL, she received 4 cycles of adjuvant carboplatin VP-16 in combination with Neulasta from 10/03/2006 through 12/05/2006. Now NED. 2) For her Breast cancer, she underwent definitive surgery by Stark Klein, on 08/21/2012. This consisted of a lumpectomy and sentinel node sampling. The tumor was 1.9 cm, grade II/III. Closest margin was 1 mm at the superior margin and 2 mm at the posterior margin. Both sentinel lymph nodes were negative. Pathologic stage was pTIc, pN0, IA. The patient completed radiation treatments to the left breast on 12/25/2012.  I believe she received a total dose of 5005 cGy.    Current therapy: Arimidex 1 mg daily was initiated on 12/31/2012 but stopped one week later because of non-specific symptoms including fatigue, malaise.   Interim History:  Sandra Ferguson is being seen today for followup of her invasive ductal carcinoma of the left breast detected on routine screening at [facility] on 08/06/2012.  Estrogen receptor is strongly positive.  Pathologic stage was T1c, IA.  She is doing well today.  She continues to decline anti-estrogen therapy.  She denies fevers, chills, new pain, nausea, vomiting, constipation, diarrhea, chest pain, shortness of breath or any other concerns.   Medications: Current Outpatient Prescriptions  Medication Sig Dispense Refill  . ALPRAZolam (XANAX) 0.5 MG tablet Take 0.5 mg by mouth 2 (two) times daily as needed. 1/2 to 1 tab po bid for anxiety     . aspirin 81 MG tablet  Take 81 mg by mouth daily.    [name] CALCIUM-VITAMIN D PO Take 1 tablet by mouth 2 (two) times daily.      . cetirizine (ZYRTEC) 10 MG tablet Take 10 mg by mouth daily.      . cholecalciferol (VITAMIN D) 1000 UNITS tablet Take 1,000 Units by mouth daily.    . Multiple Vitamins-Minerals (WOMENS 50+ MULTI VITAMIN/MIN PO) Take 1 tablet by mouth daily.    . naproxen sodium (ANAPROX) 220 MG tablet Take 220 mg by mouth as needed.    . Omega-3 Fatty Acids (OMEGA 3 PO) Take 3,000 mg by mouth daily.      . Sennosides (SENOKOT PO) Take 1 tablet by mouth at bedtime.    . polyethylene glycol (MIRALAX / GLYCOLAX) packet Take 17 g by mouth daily as needed.     No current facility-administered medications for this visit.   Allergies:  Allergies  Allergen Reactions  . Morphine Sulfate Shortness Of Breath    hallucinations    Past Medical History, Surgical history, Social history, and Family History were reviewed and updated.  IMMUNIZATIONS:  The patient does not know if she has ever had a pneumonia shot. A flu shot was given in the fall of 2013.  SMOKING HISTORY: Patient smoked up to 2 packs of cigarettes a day for 40 years. She stopped smoking in 1985.  Review of Systems: A 10 point review of systems was conducted and is otherwise negative except for what is noted above.    Physical Exam: Blood pressure 149/63, pulse 67, temperature 98 F (  36.7 C), temperature source Oral, resp. rate 18, height 5' 3.5" (1.613 m), weight 167 lb (75.751 kg). ECOG: 1 GENERAL: Patient is a well appearing female in no acute distress HEENT:  Sclerae anicteric.  Oropharynx clear and moist. No ulcerations or evidence of oropharyngeal candidiasis. Neck is supple.  NODES:  No cervical, supraclavicular, or axillary lymphadenopathy palpated.  BREAST EXAM:  Left breast s/p lumpectomy, no nodularity or sign of recurrence, no masses, right breast without noduels or masses.   LUNGS:  Clear to auscultation bilaterally.  No wheezes  or rhonchi. HEART:  Regular rate and rhythm. No murmur appreciated. ABDOMEN:  Soft, nontender.  Positive, normoactive bowel sounds. No organomegaly palpated. MSK:  No focal spinal tenderness to palpation. Full range of motion bilaterally in the upper extremities. EXTREMITIES:  No peripheral edema.   SKIN:  Clear with no obvious rashes or skin changes. No nail dyscrasia. NEURO:  Nonfocal. Well oriented.  Appropriate affect.    Lab Results: Lab Results  Component Value Date   WBC 4.9 07/08/2013   HGB 12.6 07/08/2013   HCT 36.7 07/08/2013   MCV 88.0 07/08/2013   PLT 185 07/08/2013     Chemistry      Component Value Date/Time   NA 133* 07/08/2013 1509   NA 134* 02/27/2012 1401   K 4.4 07/08/2013 1509   K 4.3 02/27/2012 1401   CL 95* 12/31/2012 1327   CL 97 02/27/2012 1401   CO2 27 07/08/2013 1509   CO2 26 02/27/2012 1401   BUN 10.9 07/08/2013 1509   BUN 8 02/27/2012 1401   CREATININE 0.7 07/08/2013 1509   CREATININE 0.64 02/27/2012 1401      Component Value Date/Time   CALCIUM 10.0 07/08/2013 1509   CALCIUM 10.1 02/27/2012 1401   ALKPHOS 51 07/08/2013 1509   ALKPHOS 44 02/27/2012 1401   AST 22 07/08/2013 1509   AST 16 02/27/2012 1401   ALT 14 07/08/2013 1509   ALT 11 02/27/2012 1401   BILITOT 0.24 07/08/2013 1509   BILITOT 0.3 02/27/2012 1401     Radiological Studies: 1. CT scan of the chest with IV contrast on 01/08/2009 showed no significant changes compared with the prior exam of 11/29/2007.  There were some tiny nodules in both lungs that were unchanged.  2. Chest x-ray from 08/04/2009 showed no abnormalities or evidence for metastatic disease.  3. A CT scan of the chest with IV contrast from 12/08/2009 showed no evidence for metastatic disease.  4. Two-view chest x-ray from 12/09/2010 showed no active cardiopulmonary disease.  5. Bilateral digital screening mammograms from 01/10/2011 carried out at [facility] concluded that the right breast  demonstrated no abnormalities. Additional imaging of the left breast was recommended and was subsequently negative.  6. Unilateral left breast mammogram was carried out at [facility] on 08/06/2012. There was a 4 x 9 mm hypoechoic mass seen at the 2:00 position located 2 cm from the nipple. Biopsy was recommended.  7. Chest x-ray, 2 view, from 02/20/2012 showed surgical changes of a prior right upper lobectomy with no suspicious findings or evidence of acute cardiopulmonary disease.  8. Chest x-ray, 2 view, from 10/24/2012 showed stable postoperative appearance of the right lung and no acute cardiopulmonary abnormalities. 9. Bone density scan carried out at the [facility] on 11/13/2012 showed a T-score of the lumbar spine of -1.7.  T-score of the left hip was -2.5.  No prior scans were available for comparison.  Impression and Plan:  1. Breast cancer, left.  Blossom is doing well today.  She has no sign of recurrence.  Her mammograms are up to date.  She will continue healthy diet and breast exams.  She has quite the family history of cancer.  I talked to her and her daughter about the potential benefits of genetic testing.  They again declined.    2. SCC of RUL. --She received 4 cycles of adjuvant carboplatin VP-16 in combination with Neulasta from 10/03/2006 through 12/05/2006. Now NED.  3. Osteoporosis.  --She will continue on vitamin d supplementation. She will need a followup bone density scan in a couple of years.  4. Follow-up. --She will return in 6 months for f/u with Dr. Burr Medico.    The above plan was reviewed with the patient and her daughter in detail.  They were in agreement.   She knows to call us in the interim for any questions or concerns.  We can certainly see her sooner if needed.   I spent 25 minutes counseling the patient face to face.  The total time spent in the appointment was 30 minutes.  Minette Headland, [place] [phone] 11/13/20153:18 PM

## 2014-06-13 NOTE — Patient Instructions (Signed)
You are doing well today.  You have no sign of recurrence.  Continue healthy diet, exercise, monthly breast exams.  We will see you back in 6 months.    Breast Self-Awareness Practicing breast self-awareness may pick up problems early, prevent significant medical complications, and possibly save your life. By practicing breast self-awareness, you can become familiar with how your breasts look and feel and if your breasts are changing. This allows you to notice changes early. It can also offer you some reassurance that your breast health is good. One way to learn what is normal for your breasts and whether your breasts are changing is to do a breast self-exam. If you find a lump or something that was not present in the past, it is best to contact your caregiver right away. Other findings that should be evaluated by your caregiver include nipple discharge, especially if it is bloody; skin changes or reddening; areas where the skin seems to be pulled in (retracted); or new lumps and bumps. Breast pain is seldom associated with cancer (malignancy), but should also be evaluated by a caregiver. HOW TO PERFORM A BREAST SELF-EXAM The best time to examine your breasts is 5-7 days after your menstrual period is over. During menstruation, the breasts are lumpier, and it may be more difficult to pick up changes. If you do not menstruate, have reached menopause, or had your uterus removed (hysterectomy), you should examine your breasts at regular intervals, such as monthly. If you are breastfeeding, examine your breasts after a feeding or after using a breast pump. Breast implants do not decrease the risk for lumps or tumors, so continue to perform breast self-exams as recommended. Talk to your caregiver about how to determine the difference between the implant and breast tissue. Also, talk about the amount of pressure you should use during the exam. Over time, you will become more familiar with the variations of your  breasts and more comfortable with the exam. A breast self-exam requires you to remove all your clothes above the waist. 1. Look at your breasts and nipples. Stand in front of a mirror in a room with good lighting. With your hands on your hips, push your hands firmly downward. Look for a difference in shape, contour, and size from one breast to the other (asymmetry). Asymmetry includes puckers, dips, or bumps. Also, look for skin changes, such as reddened or scaly areas on the breasts. Look for nipple changes, such as discharge, dimpling, repositioning, or redness. 2. Carefully feel your breasts. This is best done either in the shower or tub while using soapy water or when flat on your back. Place the arm (on the side of the breast you are examining) above your head. Use the pads (not the fingertips) of your three middle fingers on your opposite hand to feel your breasts. Start in the underarm area and use  inch (2 cm) overlapping circles to feel your breast. Use 3 different levels of pressure (light, medium, and firm pressure) at each circle before moving to the next circle. The light pressure is needed to feel the tissue closest to the skin. The medium pressure will help to feel breast tissue a little deeper, while the firm pressure is needed to feel the tissue close to the ribs. Continue the overlapping circles, moving downward over the breast until you feel your ribs below your breast. Then, move one finger-width towards the center of the body. Continue to use the  inch (2 cm) overlapping circles to feel  your breast as you move slowly up toward the collar bone (clavicle) near the base of the neck. Continue the up and down exam using all 3 pressures until you reach the middle of the chest. Do this with each breast, carefully feeling for lumps or changes. 3.  Keep a written record with breast changes or normal findings for each breast. By writing this information down, you do not need to depend only on memory  for size, tenderness, or location. Write down where you are in your menstrual cycle, if you are still menstruating. Breast tissue can have some lumps or thick tissue. However, see your caregiver if you find anything that concerns you.  SEEK MEDICAL CARE IF:  You see a change in shape, contour, or size of your breasts or nipples.   You see skin changes, such as reddened or scaly areas on the breasts or nipples.   You have an unusual discharge from your nipples.   You feel a new lump or unusually thick areas.  Document Released: 07/18/2005 Document Revised: 07/04/2012 Document Reviewed: 11/02/2011 [facility] Patient Information 2015 [place], [place]. This information is not intended to replace advice given to you by your health care provider. Make sure you discuss any questions you have with your health care provider.

## 2014-06-13 NOTE — Telephone Encounter (Signed)
[facility] PT APPT SCHEDULE FOR MAY 2016. PT AWARE SHE WILL F/U W/YF.

## 2014-06-18 ENCOUNTER — Encounter (INDEPENDENT_AMBULATORY_CARE_PROVIDER_SITE_OTHER): Payer: Medicare Other | Admitting: Ophthalmology

## 2014-06-18 ENCOUNTER — Encounter: Payer: Self-pay | Admitting: Adult Health

## 2014-06-18 DIAGNOSIS — H43813 Vitreous degeneration, bilateral: Secondary | ICD-10-CM

## 2014-06-18 DIAGNOSIS — H3532 Exudative age-related macular degeneration: Secondary | ICD-10-CM

## 2014-08-27 ENCOUNTER — Encounter (INDEPENDENT_AMBULATORY_CARE_PROVIDER_SITE_OTHER): Payer: Medicare Other | Admitting: Ophthalmology

## 2014-09-03 ENCOUNTER — Encounter (INDEPENDENT_AMBULATORY_CARE_PROVIDER_SITE_OTHER): Payer: Medicare Other | Admitting: Ophthalmology

## 2014-09-03 DIAGNOSIS — H43813 Vitreous degeneration, bilateral: Secondary | ICD-10-CM

## 2014-09-03 DIAGNOSIS — H3532 Exudative age-related macular degeneration: Secondary | ICD-10-CM

## 2014-12-03 ENCOUNTER — Encounter (INDEPENDENT_AMBULATORY_CARE_PROVIDER_SITE_OTHER): Payer: Medicare Other | Admitting: Ophthalmology

## 2014-12-03 DIAGNOSIS — H3532 Exudative age-related macular degeneration: Secondary | ICD-10-CM | POA: Diagnosis not present

## 2014-12-03 DIAGNOSIS — H43813 Vitreous degeneration, bilateral: Secondary | ICD-10-CM | POA: Diagnosis not present

## 2014-12-26 ENCOUNTER — Telehealth: Payer: Self-pay | Admitting: *Deleted

## 2014-12-26 ENCOUNTER — Encounter: Payer: Medicare Other | Admitting: Hematology

## 2014-12-26 ENCOUNTER — Telehealth: Payer: Self-pay | Admitting: Hematology

## 2014-12-26 NOTE — Telephone Encounter (Signed)
s.w. pt and r/s todays appt....pt ok and aware of new d.t

## 2014-12-26 NOTE — Progress Notes (Signed)
This encounter was created in error - please disregard.

## 2014-12-26 NOTE — Telephone Encounter (Signed)
Called pt d/t missed appt today & she states that she called 2 days ago & req. To r/s due to getting a ride.  She reports that she needs an afternoon appt.  Message left with scheduler.

## 2015-01-16 ENCOUNTER — Ambulatory Visit (HOSPITAL_BASED_OUTPATIENT_CLINIC_OR_DEPARTMENT_OTHER): Payer: Medicare Other | Admitting: Hematology

## 2015-01-16 ENCOUNTER — Telehealth: Payer: Self-pay | Admitting: Hematology

## 2015-01-16 ENCOUNTER — Other Ambulatory Visit (HOSPITAL_BASED_OUTPATIENT_CLINIC_OR_DEPARTMENT_OTHER): Payer: Medicare Other

## 2015-01-16 VITALS — BP 127/55 | HR 77 | Temp 98.3°F | Resp 18 | Ht 63.0 in | Wt 162.7 lb

## 2015-01-16 DIAGNOSIS — M899 Disorder of bone, unspecified: Secondary | ICD-10-CM | POA: Diagnosis not present

## 2015-01-16 DIAGNOSIS — C50912 Malignant neoplasm of unspecified site of left female breast: Secondary | ICD-10-CM

## 2015-01-16 DIAGNOSIS — Z853 Personal history of malignant neoplasm of breast: Secondary | ICD-10-CM | POA: Diagnosis present

## 2015-01-16 LAB — COMPREHENSIVE METABOLIC PANEL (CC13)
ALK PHOS: 40 U/L (ref 40–150)
ALT: 11 U/L (ref 0–55)
AST: 19 U/L (ref 5–34)
Albumin: 4 g/dL (ref 3.5–5.0)
Anion Gap: 8 mEq/L (ref 3–11)
BILIRUBIN TOTAL: 0.5 mg/dL (ref 0.20–1.20)
BUN: 15.5 mg/dL (ref 7.0–26.0)
CO2: 29 mEq/L (ref 22–29)
CREATININE: 0.7 mg/dL (ref 0.6–1.1)
Calcium: 9.9 mg/dL (ref 8.4–10.4)
Chloride: 101 mEq/L (ref 98–109)
EGFR: 74 mL/min/{1.73_m2} — ABNORMAL LOW (ref >90)
Glucose: 99 mg/dl (ref 70–140)
Potassium: 4.5 mEq/L (ref 3.5–5.1)
Sodium: 138 mEq/L (ref 136–145)
Total Protein: 6.7 g/dL (ref 6.4–8.3)

## 2015-01-16 LAB — CBC WITH DIFFERENTIAL/PLATELET
BASO%: 0.5 % (ref 0.0–2.0)
Basophils Absolute: 0 10*3/uL (ref 0.0–0.1)
EOS%: 2.3 % (ref 0.0–7.0)
Eosinophils Absolute: 0.1 10*3/uL (ref 0.0–0.5)
HCT: 38.6 % (ref 34.8–46.6)
HGB: 13.2 g/dL (ref 11.6–15.9)
LYMPH#: 1.5 10*3/uL (ref 0.9–3.3)
LYMPH%: 26.8 % (ref 14.0–49.7)
MCH: 31.1 pg (ref 25.1–34.0)
MCHC: 34.2 g/dL (ref 31.5–36.0)
MCV: 90.8 fL (ref 79.5–101.0)
MONO#: 0.4 10*3/uL (ref 0.1–0.9)
MONO%: 6.8 % (ref 0.0–14.0)
NEUT%: 63.6 % (ref 38.4–76.8)
NEUTROS ABS: 3.5 10*3/uL (ref 1.5–6.5)
Platelets: 170 10*3/uL (ref 145–400)
RBC: 4.25 10*6/uL (ref 3.70–5.45)
RDW: 13.1 % (ref 11.2–14.5)
WBC: 5.6 10*3/uL (ref 3.9–10.3)

## 2015-01-16 NOTE — Progress Notes (Addendum)
Hematology and Oncology Follow Up Visit  Sandra Ferguson [id number] 05-Apr-1927 81 y.o. 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 Pierre Bali. 2) Invasive carcinoma of the left breast detected by mammogram at [facility] on 08/06/2012.  Prior Therapy:   1) For her SCC of RUL, she received 4 cycles of adjuvant carboplatin VP-16 in combination with Neulasta from 10/03/2006 through 12/05/2006. Now NED. 2) For her Breast cancer, she underwent definitive surgery by Stark Klein, on 08/21/2012. This consisted of a lumpectomy and sentinel node sampling. The tumor was 1.9 cm, grade II/III. Closest margin was 1 mm at the superior margin and 2 mm at the posterior margin. Both sentinel lymph nodes were negative. Pathologic stage was pTIc, pN0, IA. The patient completed radiation treatments to the left breast on 12/25/2012.  I believe she received a total dose of 5005 cGy.  Arimidex 1 mg daily was initiated on 12/31/2012 but stopped one week later because of non-specific symptoms including fatigue, malaise.   Current therapy:  Observation   Interim History:  Sandra Ferguson is being seen today for followup. She is accompanied by her son to the clinic today. She feels well overall, has chronic right knee pain, she walks with a crane, she lives in a mobile home independently, and her son visits her daily. No other complains.   Medications: Current Outpatient Prescriptions  Medication Sig Dispense Refill  . ALPRAZolam (XANAX) 0.5 MG tablet Take 0.5 mg by mouth 2 (two) times daily as needed. 1/2 to 1 tab po bid for anxiety     . aspirin 81 MG tablet Take 81 mg by mouth daily.    [name] CALCIUM-VITAMIN D PO Take 1 tablet by mouth 2 (two) times daily.      . cetirizine (ZYRTEC) 10 MG tablet Take 10 mg by mouth daily.       . cholecalciferol (VITAMIN D) 1000 UNITS tablet Take 1,000 Units by mouth daily.    . Multiple Vitamins-Minerals (WOMENS 50+ MULTI VITAMIN/MIN PO) Take 1 tablet by mouth daily.    . naproxen sodium (ANAPROX) 220 MG tablet Take 220 mg by mouth as needed.    . Omega-3 Fatty Acids (OMEGA 3 PO) Take 3,000 mg by mouth daily.      . polyethylene glycol (MIRALAX / GLYCOLAX) packet Take 17 g by mouth daily as needed.    . Sennosides (SENOKOT PO) Take 1 tablet by mouth at bedtime.     No current facility-administered medications for this visit.   Allergies:  Allergies  Allergen Reactions  . Morphine Sulfate Shortness Of Breath    hallucinations    Past Medical History, Surgical history, Social history, and Family History were reviewed and updated.  IMMUNIZATIONS:  The patient does not know if she has ever had a pneumonia shot. A flu shot was given in the fall of 2013.  SMOKING HISTORY: Patient smoked up to 2 packs of cigarettes a day for 40 years. She stopped smoking in 1985.  Review of Systems: A 10 point review of systems was conducted and is otherwise negative except for what is noted above.    Physical Exam: There were no vitals taken for this visit. ECOG: 1 GENERAL: Patient is a well appearing female in no acute distress HEENT:  Sclerae anicteric.  Oropharynx clear and moist.  No ulcerations or evidence of oropharyngeal candidiasis. Neck is supple.  NODES:  No cervical, supraclavicular, or axillary lymphadenopathy palpated.  BREAST EXAM:  Left breast s/p lumpectomy, no nodularity or sign of recurrence, no masses, right breast without noduels or masses.   LUNGS:  Clear to auscultation bilaterally.  No wheezes or rhonchi. HEART:  Regular rate and rhythm. No murmur appreciated. ABDOMEN:  Soft, nontender.  Positive, normoactive bowel sounds. No organomegaly palpated. MSK:  No focal spinal tenderness to palpation. Full range of motion bilaterally in the upper extremities. EXTREMITIES:   No peripheral edema.   SKIN:  Clear with no obvious rashes or skin changes. No nail dyscrasia. NEURO:  Nonfocal. Well oriented.  Appropriate affect.    Lab Results: Lab Results  Component Value Date   WBC 4.9 07/08/2013   HGB 12.6 07/08/2013   HCT 36.7 07/08/2013   MCV 88.0 07/08/2013   PLT 185 07/08/2013     Chemistry      Component Value Date/Time   NA 133* 07/08/2013 1509   NA 134* 02/27/2012 1401   K 4.4 07/08/2013 1509   K 4.3 02/27/2012 1401   CL 95* 12/31/2012 1327   CL 97 02/27/2012 1401   CO2 27 07/08/2013 1509   CO2 26 02/27/2012 1401   BUN 10.9 07/08/2013 1509   BUN 8 02/27/2012 1401   CREATININE 0.7 07/08/2013 1509   CREATININE 0.64 02/27/2012 1401      Component Value Date/Time   CALCIUM 10.0 07/08/2013 1509   CALCIUM 10.1 02/27/2012 1401   ALKPHOS 51 07/08/2013 1509   ALKPHOS 44 02/27/2012 1401   AST 22 07/08/2013 1509   AST 16 02/27/2012 1401   ALT 14 07/08/2013 1509   ALT 11 02/27/2012 1401   BILITOT 0.24 07/08/2013 1509   BILITOT 0.3 02/27/2012 1401     Radiological Studies: 1. CT scan of the chest with IV contrast on 01/08/2009 showed no significant changes compared with the prior exam of 11/29/2007.  There were some tiny nodules in both lungs that were unchanged.  2. Chest x-ray from 08/04/2009 showed no abnormalities or evidence for metastatic disease.  3. A CT scan of the chest with IV contrast from 12/08/2009 showed no evidence for metastatic disease.  4. Two-view chest x-ray from 12/09/2010 showed no active cardiopulmonary disease.  5. Bilateral digital screening mammograms from 01/10/2011 carried out at [facility] concluded that the right breast demonstrated no abnormalities. Additional imaging of the left breast was recommended and was subsequently negative.  6. Unilateral left breast mammogram was carried out at [facility] on 08/06/2012. There was a 4 x 9 mm hypoechoic mass seen at the 2:00 position located 2 cm from the  nipple. Biopsy was recommended.  7. Chest x-ray, 2 view, from 02/20/2012 showed surgical changes of a prior right upper lobectomy with no suspicious findings or evidence of acute cardiopulmonary disease.  8. Chest x-ray, 2 view, from 10/24/2012 showed stable postoperative appearance of the right lung and no acute cardiopulmonary abnormalities. 9. Bone density scan carried out at the [facility] on 11/13/2012 showed a T-score of the lumbar spine of -1.7.  T-score of the left hip was -2.5.  No prior scans were available for comparison.  Impression and Plan: 81 year old female  1. Left Breast cancer, pT1cN0M0, stage IA, ER100%, PR 8%+, HER2-  -I reviewed neck to history of early stage breast cancer. She is likely cured by complete surgical resection. -she tried anastrozole but could not tolerated -  She is clinically doing very well, exam was unremarkable, last mammogram had no evidence of recurrence. -will continue surveillance. She will continue annual screening mammogram.   -She will continue healthy diet and breast exams.  I encouraged her to be as active as she can.  2. SCC of RUL. --She received 4 cycles of adjuvant carboplatin VP-16 in combination with Neulasta from 10/03/2006 through 12/05/2006. Now NED.  3. Osteoporosis.  --She will continue on vitamin D supplementation. She will need a followup bone density scan in a couple of years.  4. Follow-up. --She will return in 6 months.   The above plan was reviewed with the patient and her son in detail.  They were in agreement.   She knows to call us in the interim for any questions or concerns.  We can certainly see her sooner if needed.   I spent 25 minutes counseling the patient face to face.  The total time spent in the appointment was 30 minutes.  [place] [phone] 6/17/201612:58 PM

## 2015-01-16 NOTE — Telephone Encounter (Signed)
Gave adn printed appt sched adn avs for pt for DEC

## 2015-01-18 ENCOUNTER — Encounter: Payer: Self-pay | Admitting: Hematology

## 2015-02-27 ENCOUNTER — Encounter (INDEPENDENT_AMBULATORY_CARE_PROVIDER_SITE_OTHER): Payer: Medicare Other | Admitting: Ophthalmology

## 2015-02-27 DIAGNOSIS — H3532 Exudative age-related macular degeneration: Secondary | ICD-10-CM

## 2015-02-27 DIAGNOSIS — H43813 Vitreous degeneration, bilateral: Secondary | ICD-10-CM

## 2015-02-27 DIAGNOSIS — H3531 Nonexudative age-related macular degeneration: Secondary | ICD-10-CM

## 2015-03-11 NOTE — Progress Notes (Signed)
No pain in her left breast. Pt complains of fatigue and weakness .  Pt left breast- negative for moist desquamation and dry desquamation. negative for any Lymphema, over bilateral upper extremities, over right upper extremity and over left upper extremity. Pt continues to apply. Skin intact  Pt reports: Yes No Comments  Tamoxifen '[]'$  '[]'$    Arimidex '[]'$  '[x]'$  Was started 12/31/12 but stopped one week later bc of non-specific symptoms including fatigue, malaise  Mammogram '[x]'$   Date: 08/06/12 '[]'$     Other:  Small cell carcinoma of the right upper lobe.  Right upper lobectomy on 09/01/06.  Received 4 cycles of adjuvant carboplatin VP-16 in combination with Neulasta from 10/03/06-12/05/06.  Chronic right knee pain, walks with a cane, live independently, her son visits daily.    Vital Signs: BP 114/47 mmHg  Pulse 77  Temp(Src) 98.4 F (36.9 C)  Ht '5\' 3"'$  (1.6 m)  Wt 163 lb 9.6 oz (74.208 kg)  BMI 28.99 kg/m2  SpO2 96%

## 2015-03-13 ENCOUNTER — Ambulatory Visit
Admission: RE | Admit: 2015-03-13 | Discharge: 2015-03-13 | Disposition: A | Discharge status: Home / Self Care | Payer: Medicare Other | Source: Ambulatory Visit | Attending: Radiation Oncology | Admitting: Radiation Oncology

## 2015-03-13 ENCOUNTER — Encounter: Payer: Self-pay | Admitting: Radiation Oncology

## 2015-03-13 VITALS — BP 114/47 | HR 77 | Temp 98.4°F | Ht 63.0 in | Wt 163.6 lb

## 2015-03-13 DIAGNOSIS — C50412 Malignant neoplasm of upper-outer quadrant of left female breast: Secondary | ICD-10-CM

## 2015-03-13 NOTE — Progress Notes (Signed)
Radiation Oncology         [phone] ________________________________  Name: Sandra Ferguson MRN: [id number]  Date: 03/13/2015  DOB: February 12, 1927  Follow-Up Visit Note  Outpatient  CC: Merrilee Seashore, MD  Stark Klein, MD  Diagnosis and Prior Radiotherapy:    Left Breast invasive ductal carcinoma, T1c N0 M0, grade 2, ER 100% PR 80% HER-2/neu negative.  Radiation treatment dates: 11/27/2012-12/25/2012  Site/dose:  1) Left Breast / 40.05Gy in 15 fractions  2) Left Breast Boost / 10 Gy in 5 fractions   Narrative:  The patient returns today for routine follow-up. No pain in her left breast. Pt complains of fatigue and weakness. The pt is not receiving anti-estrogen therapy.  ALLERGIES:  is allergic to morphine sulfate.  Meds: Current Outpatient Prescriptions  Medication Sig Dispense Refill  . acetaminophen (TYLENOL) 500 MG tablet Take 1,000 mg by mouth every 8 (eight) hours as needed.    . ALPRAZolam (XANAX) 0.5 MG tablet Take 0.5 mg by mouth 2 (two) times daily as needed. 1/2 to 1 tab po bid for anxiety     . aspirin 81 MG tablet Take 81 mg by mouth daily.    [name] CALCIUM-VITAMIN D PO Take 1 tablet by mouth daily. 1000 mg    . cetirizine (ZYRTEC) 10 MG tablet Take 10 mg by mouth daily.      . cholecalciferol (VITAMIN D) 1000 UNITS tablet Take 3,000 Units by mouth daily.     . naproxen sodium (ANAPROX) 220 MG tablet Take 220 mg by mouth as needed.    . Omega-3 Fatty Acids (OMEGA 3 PO) Take 3,000 mg by mouth daily.      . polyethylene glycol (MIRALAX / GLYCOLAX) packet Take 17 g by mouth daily as needed.    . Sennosides (SENOKOT PO) Take 1 tablet by mouth at bedtime.     No current facility-administered medications for this encounter.    Physical Findings: The patient is in no acute distress. Patient is alert and oriented.  height is _0  (1.6 m) and weight is 163 lb 9.6 oz (74.208 kg). Her temperature is 98.4 F (36.9 C). Her blood pressure is 114/47 and her pulse is 77. Her  oxygen saturation is 96%.  The pt reports to the clinic in a wheelchair. Pt ambulates with a cane at home. Skin intact and smooth over neck. No palpables masses in either breast. No axillary palpable nodes. Residual dryness of the skin and hyperpigmentation over the left breast.  Lab Findings: Lab Results  Component Value Date   WBC 5.6 01/16/2015   HGB 13.2 01/16/2015   HCT 38.6 01/16/2015   MCV 90.8 01/16/2015   PLT 170 01/16/2015    Radiographic Findings: In February 2016, bilateral breast mammograms indicate no evidence of recurrent disease.  Impression/Plan: Continue with annual mammography and follow up with me in 1 year after the mammogram. I advised her to use Vitamin E oil over the left breast to facilitate healing.   I have encouraged her to call if she has any issues or concerns in the future. I wished her the very best until I see her again.  This document serves as a record of services personally performed by Eppie Gibson, MD. It was created on her behalf by Darcus Austin, a trained medical scribe. The creation of this record is based on the scribe's personal observations and the provider's statements to them. This document has been checked and approved by the attending provider.  _____________________________________   Eppie Gibson, MD

## 2015-05-22 ENCOUNTER — Encounter (INDEPENDENT_AMBULATORY_CARE_PROVIDER_SITE_OTHER): Payer: Medicare Other | Admitting: Ophthalmology

## 2015-05-28 ENCOUNTER — Encounter (INDEPENDENT_AMBULATORY_CARE_PROVIDER_SITE_OTHER): Payer: Medicare Other | Admitting: Ophthalmology

## 2015-05-28 DIAGNOSIS — H353231 Exudative age-related macular degeneration, bilateral, with active choroidal neovascularization: Secondary | ICD-10-CM

## 2015-05-28 DIAGNOSIS — H43813 Vitreous degeneration, bilateral: Secondary | ICD-10-CM

## 2015-07-14 ENCOUNTER — Telehealth: Payer: Self-pay | Admitting: Hematology

## 2015-07-14 NOTE — Telephone Encounter (Signed)
pt cld to r/s appt-gave r/s time & date 

## 2015-07-17 ENCOUNTER — Other Ambulatory Visit: Payer: Medicare Other

## 2015-07-17 ENCOUNTER — Ambulatory Visit: Payer: Medicare Other | Admitting: Hematology

## 2015-08-06 ENCOUNTER — Telehealth: Payer: Self-pay | Admitting: Hematology

## 2015-08-06 NOTE — Telephone Encounter (Signed)
pt cld to CX appt-stated daughter sick and that her transportation and she will call back to r/s at a later date

## 2015-08-07 ENCOUNTER — Ambulatory Visit: Payer: Medicare Other | Admitting: Hematology

## 2015-08-07 ENCOUNTER — Other Ambulatory Visit: Payer: Medicare Other

## 2015-08-24 ENCOUNTER — Encounter (INDEPENDENT_AMBULATORY_CARE_PROVIDER_SITE_OTHER): Payer: Medicare Other | Admitting: Ophthalmology

## 2015-08-24 DIAGNOSIS — H43813 Vitreous degeneration, bilateral: Secondary | ICD-10-CM

## 2015-08-24 DIAGNOSIS — H353231 Exudative age-related macular degeneration, bilateral, with active choroidal neovascularization: Secondary | ICD-10-CM | POA: Diagnosis not present

## 2015-08-31 ENCOUNTER — Telehealth: Payer: Self-pay | Admitting: Hematology

## 2015-08-31 NOTE — Telephone Encounter (Signed)
Pt's daughter called to r/s labs/ov from 01/06, confirmed updated schedule... KJ

## 2015-09-04 ENCOUNTER — Ambulatory Visit (HOSPITAL_BASED_OUTPATIENT_CLINIC_OR_DEPARTMENT_OTHER): Payer: Medicare Other | Admitting: Hematology

## 2015-09-04 ENCOUNTER — Other Ambulatory Visit (HOSPITAL_BASED_OUTPATIENT_CLINIC_OR_DEPARTMENT_OTHER): Payer: Medicare Other

## 2015-09-04 ENCOUNTER — Encounter: Payer: Self-pay | Admitting: Hematology

## 2015-09-04 VITALS — BP 134/62 | HR 83 | Temp 97.9°F | Resp 17 | Ht 63.0 in | Wt 163.8 lb

## 2015-09-04 DIAGNOSIS — C50912 Malignant neoplasm of unspecified site of left female breast: Secondary | ICD-10-CM

## 2015-09-04 DIAGNOSIS — Z853 Personal history of malignant neoplasm of breast: Secondary | ICD-10-CM

## 2015-09-04 DIAGNOSIS — Z85118 Personal history of other malignant neoplasm of bronchus and lung: Secondary | ICD-10-CM

## 2015-09-04 DIAGNOSIS — M810 Age-related osteoporosis without current pathological fracture: Secondary | ICD-10-CM

## 2015-09-04 LAB — CBC WITH DIFFERENTIAL/PLATELET
BASO%: 0.5 % (ref 0.0–2.0)
BASOS ABS: 0 10*3/uL (ref 0.0–0.1)
EOS ABS: 0.2 10*3/uL (ref 0.0–0.5)
EOS%: 2.9 % (ref 0.0–7.0)
HEMATOCRIT: 38.9 % (ref 34.8–46.6)
HEMOGLOBIN: 13.3 g/dL (ref 11.6–15.9)
LYMPH%: 25.1 % (ref 14.0–49.7)
MCH: 30.6 pg (ref 25.1–34.0)
MCHC: 34.2 g/dL (ref 31.5–36.0)
MCV: 89.4 fL (ref 79.5–101.0)
MONO#: 0.4 10*3/uL (ref 0.1–0.9)
MONO%: 6.7 % (ref 0.0–14.0)
NEUT#: 3.8 10*3/uL (ref 1.5–6.5)
NEUT%: 64.8 % (ref 38.4–76.8)
Platelets: 197 10*3/uL (ref 145–400)
RBC: 4.35 10*6/uL (ref 3.70–5.45)
RDW: 13.1 % (ref 11.2–14.5)
WBC: 5.8 10*3/uL (ref 3.9–10.3)
lymph#: 1.5 10*3/uL (ref 0.9–3.3)

## 2015-09-04 LAB — COMPREHENSIVE METABOLIC PANEL
ALK PHOS: 42 U/L (ref 40–150)
ALT: 13 U/L (ref 0–55)
AST: 20 U/L (ref 5–34)
Albumin: 3.8 g/dL (ref 3.5–5.0)
Anion Gap: 11 mEq/L (ref 3–11)
BUN: 10.4 mg/dL (ref 7.0–26.0)
CALCIUM: 9.1 mg/dL (ref 8.4–10.4)
CO2: 25 mEq/L (ref 22–29)
Chloride: 101 mEq/L (ref 98–109)
Creatinine: 0.8 mg/dL (ref 0.6–1.1)
EGFR: 67 mL/min/{1.73_m2} — AB (ref >90)
Glucose: 101 mg/dl (ref 70–140)
POTASSIUM: 4.2 meq/L (ref 3.5–5.1)
Sodium: 138 mEq/L (ref 136–145)
Total Bilirubin: <0.3 mg/dL (ref 0.20–1.20)
Total Protein: 7.1 g/dL (ref 6.4–8.3)

## 2015-09-04 NOTE — Progress Notes (Signed)
[facility]  Hematology and Oncology Follow Up Visit  Sandra Ferguson [id number] 1927-04-11 82 y.o. 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 Pierre Bali.  2) Invasive carcinoma of the left breast detected by mammogram at [facility] on 08/06/2012.  Prior Therapy:    1) For her SCC of RUL, she received 4 cycles of adjuvant carboplatin VP-16 in combination with Neulasta from 10/03/2006 through 12/05/2006. Now NED.  2) For her Breast cancer, she underwent definitive surgery by Stark Klein, on 08/21/2012. This consisted of a lumpectomy and sentinel node sampling. The tumor was 1.9 cm, grade II/III. Closest margin was 1 mm at the superior margin and 2 mm at the posterior margin. Both sentinel lymph nodes were negative. Pathologic stage was pTIc, pN0, IA. The patient completed radiation treatments to the left breast on 12/25/2012.  I believe she received a total dose of 5005 cGy.  Arimidex 1 mg daily was initiated on 12/31/2012 but stopped one week later because of non-specific symptoms including fatigue, malaise.   Current therapy:  Observation   Interim History:  Sandra Ferguson returns for follow up. She is accompanied by her daughter to the clinic today. She is doing well overall. She has chronic arthritis, especially the right knee pain, which limits her activities. She otherwise feels well, denies other symptoms. She lives independently, and her son visits her on daily basis.  Medications: Current Outpatient Prescriptions  Medication Sig Dispense Refill  . acetaminophen (TYLENOL) 500 MG tablet Take 1,000 mg by mouth every 8 (eight) hours as needed.    . ALPRAZolam (XANAX) 0.5 MG tablet Take 0.5 mg by mouth 2 (two) times daily as needed. 1/2 to 1 tab po bid for anxiety     .  aspirin 81 MG tablet Take 81 mg by mouth daily.    [name] CALCIUM-VITAMIN D PO Take 1 tablet by mouth daily. 1000 mg    . cetirizine (ZYRTEC) 10 MG tablet Take 10 mg by mouth daily.      . cholecalciferol (VITAMIN D) 1000 UNITS tablet Take 3,000 Units by mouth daily.     . naproxen sodium (ANAPROX) 220 MG tablet Take 220 mg by mouth as needed.    . Omega-3 Fatty Acids (OMEGA 3 PO) Take 3,000 mg by mouth daily.      . polyethylene glycol (MIRALAX / GLYCOLAX) packet Take 17 g by mouth daily as needed.    . Sennosides (SENOKOT PO) Take 1 tablet by mouth at bedtime.     No current facility-administered medications for this visit.   Allergies:  Allergies  Allergen Reactions  . Morphine Sulfate Shortness Of Breath    Hallucinations and convulsions    Past Medical History, Surgical history, Social history, and Family History were reviewed and updated.  IMMUNIZATIONS:  The patient does not know if she has ever had a pneumonia shot. A flu shot was given in the fall of 2013.  SMOKING HISTORY: Patient smoked up to 2 packs of cigarettes a day for 40 years. She stopped smoking in 1985.  Review of Systems: A 10 point review of systems was conducted and is otherwise negative except for what is noted above.    Physical Exam: Blood pressure 134/62, pulse 83, temperature 97.9 F (36.6 C), temperature source Oral, resp. rate 17,  height 5' 3" (1.6 m), weight 163 lb 12.8 oz (74.299 kg), SpO2 95 %. ECOG: 1 GENERAL: Patient is a well appearing female in no acute distress HEENT:  Sclerae anicteric.  Oropharynx clear and moist. No ulcerations or evidence of oropharyngeal candidiasis. Neck is supple.  NODES:  No cervical, supraclavicular, or axillary lymphadenopathy palpated.  BREAST EXAM:  Left breast s/p lumpectomy, no nodularity or sign of recurrence, no masses, right breast without noduels or masses.   LUNGS:  Clear to auscultation bilaterally.  No wheezes or rhonchi. HEART:  Regular rate and rhythm. No  murmur appreciated. ABDOMEN:  Soft, nontender.  Positive, normoactive bowel sounds. No organomegaly palpated. MSK:  No focal spinal tenderness to palpation. Full range of motion bilaterally in the upper extremities. EXTREMITIES:  No peripheral edema.   SKIN:  Clear with no obvious rashes or skin changes. No nail dyscrasia. NEURO:  Nonfocal. Well oriented.  Appropriate affect.  Lab Results: CBC Latest Ref Rng 09/04/2015 01/16/2015 07/08/2013  WBC 3.9 - 10.3 10e3/uL 5.8 5.6 4.9  Hemoglobin 11.6 - 15.9 g/dL 13.3 13.2 12.6  Hematocrit 34.8 - 46.6 % 38.9 38.6 36.7  Platelets 145 - 400 10e3/uL 197 170 185    CMP Latest Ref Rng 09/04/2015 01/16/2015 07/08/2013  Glucose 70 - 140 mg/dl 101 99 101  BUN 7.0 - 26.0 mg/dL 10.4 15.5 10.9  Creatinine 0.6 - 1.1 mg/dL 0.8 0.7 0.7  Sodium 136 - 145 mEq/L 138 138 133(L)  Potassium 3.5 - 5.1 mEq/L 4.2 4.5 4.4  Chloride 98 - 107 mEq/L - - -  CO2 22 - 29 mEq/L _0 Calcium 8.4 - 10.4 mg/dL 9.1 9.9 10.0  Total Protein 6.4 - 8.3 g/dL 7.1 6.7 6.9  Total Bilirubin 0.20 - 1.20 mg/dL <0.30 0.50 0.24  Alkaline Phos 40 - 150 U/L 42 40 51  AST 5 - 34 U/L _1 ALT 0 - 55 U/L _2 Radiological Studies: 1. CT scan of the chest with IV contrast on 01/08/2009 showed no significant changes compared with the prior exam of 11/29/2007.  There were some tiny nodules in both lungs that were unchanged.  2. Chest x-ray from 08/04/2009 showed no abnormalities or evidence for metastatic disease.  3. A CT scan of the chest with IV contrast from 12/08/2009 showed no evidence for metastatic disease.  4. Two-view chest x-ray from 12/09/2010 showed no active cardiopulmonary disease.  5. Bilateral digital screening mammograms from 01/10/2011 carried out at [facility] concluded that the right breast demonstrated no abnormalities. Additional imaging of the left breast was recommended and was subsequently negative.  6. Unilateral left breast mammogram was  carried out at [facility] on 08/06/2012. There was a 4 x 9 mm hypoechoic mass seen at the 2:00 position located 2 cm from the nipple. Biopsy was recommended.  7. Chest x-ray, 2 view, from 02/20/2012 showed surgical changes of a prior right upper lobectomy with no suspicious findings or evidence of acute cardiopulmonary disease.  8. Chest x-ray, 2 view, from 10/24/2012 showed stable postoperative appearance of the right lung and no acute cardiopulmonary abnormalities. 9. Bone density scan carried out at the [place] on 11/13/2012 showed a T-score of the lumbar spine of -1.7.  T-score of the left hip was -2.5.  No prior scans were available for comparison.  Impression and Plan: 83 year old female  1. Left Breast cancer, pT1cN0M0, stage IA, ER100%, PR 8%+, HER2- , diagnosed  in 08/2012 -I reviewed neck to history of early stage breast cancer. She is likely cured by complete surgical resection. -She did try  Anastrozole, but could not tolerate and stopped it. -She is clinically doing very well, exam was unremarkable, last mammogram had no evidence of recurrence. -Today's lab results or normal, I reviewed with patient and her daughter. Burnis Medin continue breast cancer surveillance. I encouraged her to continue annual screening mammogram, she is due this months, I'll order one for her.  2. SCC of RUL in 2008 --She received 4 cycles of adjuvant carboplatin VP-16 in combination with Neulasta from 10/03/2006 through 12/05/2006. Now NED.  3. Osteoporosis.  --She will continue on vitamin D supplementation. She will need a followup bone density scan soon   4. Follow-up. -Mammogram in a month -I'll see her back in one year  The above plan was reviewed with the patient and her son in detail.  They were in agreement.   She knows to call us in the interim for any questions or concerns.  We can certainly see her sooner if needed.   I spent 15 minutes counseling the patient face to  face.  The total time spent in the appointment was 25 minutes.  Truitt Merle  2/3/20178:11 AM

## 2015-09-07 ENCOUNTER — Telehealth: Payer: Self-pay | Admitting: Hematology

## 2015-09-07 NOTE — Addendum Note (Signed)
Addended by: Truitt Merle on: 09/07/2015 05:33 PM   Modules accepted: Orders

## 2015-09-07 NOTE — Telephone Encounter (Signed)
per [facility] to[facility] h pt appt-sent Dr Burr Medico email to put mamma order in-will call pt once reply

## 2015-09-08 ENCOUNTER — Telehealth: Payer: Self-pay | Admitting: Hematology

## 2015-09-08 NOTE — Telephone Encounter (Signed)
per reply order in-per [facility] pt already sch and called

## 2015-11-27 ENCOUNTER — Encounter (INDEPENDENT_AMBULATORY_CARE_PROVIDER_SITE_OTHER): Payer: Medicare Other | Admitting: Ophthalmology

## 2015-11-30 ENCOUNTER — Encounter (INDEPENDENT_AMBULATORY_CARE_PROVIDER_SITE_OTHER): Payer: Medicare Other | Admitting: Ophthalmology

## 2015-11-30 DIAGNOSIS — H4313 Vitreous hemorrhage, bilateral: Secondary | ICD-10-CM | POA: Diagnosis not present

## 2015-11-30 DIAGNOSIS — H353231 Exudative age-related macular degeneration, bilateral, with active choroidal neovascularization: Secondary | ICD-10-CM | POA: Diagnosis not present

## 2016-02-24 ENCOUNTER — Encounter (INDEPENDENT_AMBULATORY_CARE_PROVIDER_SITE_OTHER): Payer: Medicare Other | Admitting: Ophthalmology

## 2016-02-24 DIAGNOSIS — H353231 Exudative age-related macular degeneration, bilateral, with active choroidal neovascularization: Secondary | ICD-10-CM

## 2016-02-24 DIAGNOSIS — H43813 Vitreous degeneration, bilateral: Secondary | ICD-10-CM

## 2016-06-08 ENCOUNTER — Encounter (INDEPENDENT_AMBULATORY_CARE_PROVIDER_SITE_OTHER): Payer: Medicare Other | Admitting: Ophthalmology

## 2016-06-08 DIAGNOSIS — H353231 Exudative age-related macular degeneration, bilateral, with active choroidal neovascularization: Secondary | ICD-10-CM | POA: Diagnosis not present

## 2016-06-08 DIAGNOSIS — H2703 Aphakia, bilateral: Secondary | ICD-10-CM | POA: Diagnosis not present

## 2016-06-08 DIAGNOSIS — H43813 Vitreous degeneration, bilateral: Secondary | ICD-10-CM | POA: Diagnosis not present

## 2016-09-01 NOTE — Progress Notes (Signed)
[facility]  Hematology and Oncology Follow Up Visit  Sandra Ferguson [id number] [birth year] 83 y.o. 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 Pierre Bali.  2) Invasive carcinoma of the left breast detected by mammogram at [facility] on 08/06/2012.  Prior Therapy:    1) For her SCC of RUL, she received 4 cycles of adjuvant carboplatin VP-16 in combination with Neulasta from 10/03/2006 through 12/05/2006. Now NED.  2) For her Breast cancer, she underwent definitive surgery by Stark Klein, on 08/21/2012. This consisted of a lumpectomy and sentinel node sampling. The tumor was 1.9 cm, grade II/III. Closest margin was 1 mm at the superior margin and 2 mm at the posterior margin. Both sentinel lymph nodes were negative. Pathologic stage was pTIc, pN0, IA. The patient completed radiation treatments to the left breast on 12/25/2012.  I believe she received a total dose of 5005 cGy.  Arimidex 1 mg daily was initiated on 12/31/2012 but stopped one week later because of non-specific symptoms including fatigue, malaise.   Current therapy:  Observation   Interim History:  Sandra Ferguson returns for follow up. She says she is doing well. Her R knee is still causing her some pain, she walks with a cane. She takes aleve for the pain. Denies any other concerns.   Medications: Current Outpatient Prescriptions  Medication Sig Dispense Refill  . ALPRAZolam (XANAX) 0.5 MG tablet Take 0.5 mg by mouth 2 (two) times daily as needed. 1/2 to 1 tab po bid for anxiety     . aspirin 81 MG tablet Take 81 mg by mouth daily.    [name] CALCIUM-VITAMIN D PO Take 1 tablet by mouth daily. 1000 mg    . cetirizine (ZYRTEC) 10 MG tablet Take 10 mg by mouth daily.      . cholecalciferol (VITAMIN D) 1000 UNITS tablet  Take 3,000 Units by mouth daily.     . naproxen sodium (ANAPROX) 220 MG tablet Take 220 mg by mouth as needed.    . Omega-3 Fatty Acids (OMEGA 3 PO) Take 3,000 mg by mouth daily.      . polyethylene glycol (MIRALAX / GLYCOLAX) packet Take 17 g by mouth daily as needed.    . Sennosides (SENOKOT PO) Take 1 tablet by mouth at bedtime.     No current facility-administered medications for this visit.    Allergies:  Allergies  Allergen Reactions  . Morphine Sulfate Shortness Of Breath    Hallucinations and convulsions    Past Medical History, Surgical history, Social history, and Family History were reviewed and updated.  IMMUNIZATIONS:  The patient does not know if she has ever had a pneumonia shot. A flu shot was given in the fall of 2013.  SMOKING HISTORY: Patient smoked up to 2 packs of cigarettes a day for 40 years. She stopped smoking in 1985.  Review of Systems: Review of Systems  Constitutional: Negative.   HENT: Negative.   Eyes: Negative.   Respiratory: Negative.   Cardiovascular: Negative.   Gastrointestinal: Negative.   Genitourinary: Negative.   Musculoskeletal: Positive for joint pain (R knee).  Skin: Negative.   Neurological: Negative.   Endo/Heme/Allergies: Negative.   Psychiatric/Behavioral: Negative.   All other systems reviewed and are negative.  A 10 point review of systems was conducted and  is otherwise negative except for what is noted above.    Physical Exam:  Blood pressure (!) 155/63, pulse 78, temperature 98.4 F (36.9 C), temperature source Oral, resp. rate 18, height '5\' 3"'  (1.6 m), weight 161 lb 14.4 oz (73.4 kg), SpO2 95 %.   ECOG: 1 GENERAL: Patient is a well appearing female in no acute distress HEENT:  Sclerae anicteric.  Oropharynx clear and moist. No ulcerations or evidence of oropharyngeal candidiasis. Neck is supple.  NODES:  No cervical, supraclavicular, or axillary lymphadenopathy palpated.  BREAST EXAM:  Left breast s/p lumpectomy, no  nodularity or sign of recurrence, no masses, right breast without noduels or masses.   LUNGS:  Clear to auscultation bilaterally.  No wheezes or rhonchi. HEART:  Regular rate and rhythm. No murmur appreciated. ABDOMEN:  Soft, nontender.  Positive, normoactive bowel sounds. No organomegaly palpated. MSK:  No focal spinal tenderness to palpation. Full range of motion bilaterally in the upper extremities. EXTREMITIES:  No peripheral edema.   SKIN:  Clear with no obvious rashes or skin changes. No nail dyscrasia. NEURO:  Nonfocal. Well oriented.  Appropriate affect.  Lab Results: CBC Latest Ref Rng & Units 09/06/2016 09/04/2015 01/16/2015  WBC 3.9 - 10.3 10e3/uL 6.2 5.8 5.6  Hemoglobin 11.6 - 15.9 g/dL 13.6 13.3 13.2  Hematocrit 34.8 - 46.6 % 40.7 38.9 38.6  Platelets 145 - 400 10e3/uL 207 197 170    CMP Latest Ref Rng & Units 09/06/2016 09/04/2015 01/16/2015  Glucose 70 - 140 mg/dl 101 101 99  BUN 7.0 - 26.0 mg/dL 13.7 10.4 15.5  Creatinine 0.6 - 1.1 mg/dL 0.7 0.8 0.7  Sodium 136 - 145 mEq/L 137 138 138  Potassium 3.5 - 5.1 mEq/L 4.6 4.2 4.5  Chloride 98 - 107 mEq/L - - -  CO2 22 - 29 mEq/L '29 25 29  ' Calcium 8.4 - 10.4 mg/dL 10.4 9.1 9.9  Total Protein 6.4 - 8.3 g/dL 6.9 7.1 6.7  Total Bilirubin 0.20 - 1.20 mg/dL 0.32 <0.30 0.50  Alkaline Phos 40 - 150 U/L 59 42 40  AST 5 - 34 U/L '19 20 19  ' ALT 0 - 55 U/L '11 13 11     ' Radiological Studies: 1. CT scan of the chest with IV contrast on 01/08/2009 showed no significant changes compared with the prior exam of 11/29/2007.  There were some tiny nodules in both lungs that were unchanged.  2. Chest x-ray from 08/04/2009 showed no abnormalities or evidence for metastatic disease.  3. A CT scan of the chest with IV contrast from 12/08/2009 showed no evidence for metastatic disease.  4. Two-view chest x-ray from 12/09/2010 showed no active cardiopulmonary disease.  5. Bilateral digital screening mammograms from 01/10/2011 carried out at [facility] concluded that the right breast demonstrated no abnormalities. Additional imaging of the left breast was recommended and was subsequently negative.  6. Unilateral left breast mammogram was carried out at [facility] on 08/06/2012. There was a 4 x 9 mm hypoechoic mass seen at the 2:00 position located 2 cm from the nipple. Biopsy was recommended.  7. Chest x-ray, 2 view, from 02/20/2012 showed surgical changes of a prior right upper lobectomy with no suspicious findings or evidence of acute cardiopulmonary disease.  8. Chest x-ray, 2 view, from 10/24/2012 showed stable postoperative appearance of the right lung and no acute cardiopulmonary abnormalities. 9. Bone density scan carried out at the [place] on 11/13/2012 showed a T-score of the lumbar spine  of -1.7.  T-score of the left hip was -2.5.  No prior scans were available for comparison.  ASSESSMENT AND PLAN: 83 y.o. female with:  1. Left Breast cancer, pT1cN0M0, stage IA, ER100%, PR 8%+, HER2- , diagnosed in 08/2012 -I previously reviewed neck to history of early stage breast cancer. She is likely cured by complete surgical resection. -She did try  Anastrozole, but could not tolerate and stopped it. -She is clinically doing very well, exam was unremarkable, last mammogram had no evidence of recurrence. -Today's lab results are normal, I reviewed with patient and her daughter Burnis Medin continue breast cancer surveillance. I encouraged her to continue annual screening mammogram, she is due this month, I'll order one for her.  2. SCC of RUL in 2008 -She received 4 cycles of adjuvant carboplatin VP-16 in combination with Neulasta from 10/03/2006 through 12/05/2006. Now NED. No need for routine surveillance scan.  3. Osteoporosis.  -She will continue on vitamin D supplementation. She will need a follow up bone density scan soon   Plan -Mammogram this month  -I'll see her back in one year  The above plan was  reviewed with the patient and her son in detail.  They were in agreement.   She knows to call us in the interim for any questions or concerns.  We can certainly see her sooner if needed.  I spent 15 minutes counseling the patient face to face.  The total time spent in the appointment was 20 minutes.  This document serves as a record of services personally performed by Truitt Merle, MD. It was created on her behalf by Martinique Casey, a trained medical scribe. The creation of this record is based on the scribe's personal observations and the provider's statements to them. This document has been checked and approved by the attending provider.  I have reviewed the above documentation for accuracy and completeness and I agree with the above.  Truitt Merle  09/06/2016

## 2016-09-06 ENCOUNTER — Ambulatory Visit (HOSPITAL_BASED_OUTPATIENT_CLINIC_OR_DEPARTMENT_OTHER): Payer: Medicare Other | Admitting: Hematology

## 2016-09-06 ENCOUNTER — Other Ambulatory Visit (HOSPITAL_BASED_OUTPATIENT_CLINIC_OR_DEPARTMENT_OTHER): Payer: Medicare Other

## 2016-09-06 VITALS — BP 155/63 | HR 78 | Temp 98.4°F | Resp 18 | Ht 63.0 in | Wt 161.9 lb

## 2016-09-06 DIAGNOSIS — C3491 Malignant neoplasm of unspecified part of right bronchus or lung: Secondary | ICD-10-CM

## 2016-09-06 DIAGNOSIS — C50912 Malignant neoplasm of unspecified site of left female breast: Secondary | ICD-10-CM

## 2016-09-06 DIAGNOSIS — Z85118 Personal history of other malignant neoplasm of bronchus and lung: Secondary | ICD-10-CM

## 2016-09-06 DIAGNOSIS — M810 Age-related osteoporosis without current pathological fracture: Secondary | ICD-10-CM

## 2016-09-06 DIAGNOSIS — Z853 Personal history of malignant neoplasm of breast: Secondary | ICD-10-CM

## 2016-09-06 LAB — COMPREHENSIVE METABOLIC PANEL
ALT: 11 U/L (ref 0–55)
ANION GAP: 7 meq/L (ref 3–11)
AST: 19 U/L (ref 5–34)
Albumin: 3.9 g/dL (ref 3.5–5.0)
Alkaline Phosphatase: 59 U/L (ref 40–150)
BUN: 13.7 mg/dL (ref 7.0–26.0)
CALCIUM: 10.4 mg/dL (ref 8.4–10.4)
CO2: 29 mEq/L (ref 22–29)
Chloride: 101 mEq/L (ref 98–109)
Creatinine: 0.7 mg/dL (ref 0.6–1.1)
EGFR: 75 mL/min/{1.73_m2} — ABNORMAL LOW (ref >90)
Glucose: 101 mg/dl (ref 70–140)
POTASSIUM: 4.6 meq/L (ref 3.5–5.1)
Sodium: 137 mEq/L (ref 136–145)
Total Bilirubin: 0.32 mg/dL (ref 0.20–1.20)
Total Protein: 6.9 g/dL (ref 6.4–8.3)

## 2016-09-06 LAB — CBC WITH DIFFERENTIAL/PLATELET
BASO%: 1.1 % (ref 0.0–2.0)
BASOS ABS: 0.1 10*3/uL (ref 0.0–0.1)
EOS ABS: 0.1 10*3/uL (ref 0.0–0.5)
EOS%: 1.9 % (ref 0.0–7.0)
HCT: 40.7 % (ref 34.8–46.6)
HEMOGLOBIN: 13.6 g/dL (ref 11.6–15.9)
LYMPH#: 1.7 10*3/uL (ref 0.9–3.3)
LYMPH%: 27.8 % (ref 14.0–49.7)
MCH: 30 pg (ref 25.1–34.0)
MCHC: 33.3 g/dL (ref 31.5–36.0)
MCV: 90 fL (ref 79.5–101.0)
MONO#: 0.5 10*3/uL (ref 0.1–0.9)
MONO%: 7.3 % (ref 0.0–14.0)
NEUT#: 3.9 10*3/uL (ref 1.5–6.5)
NEUT%: 61.9 % (ref 38.4–76.8)
PLATELETS: 207 10*3/uL (ref 145–400)
RBC: 4.53 10*6/uL (ref 3.70–5.45)
RDW: 13.4 % (ref 11.2–14.5)
WBC: 6.2 10*3/uL (ref 3.9–10.3)

## 2016-09-10 ENCOUNTER — Telehealth: Payer: Self-pay | Admitting: Hematology

## 2016-09-10 ENCOUNTER — Encounter: Payer: Self-pay | Admitting: Hematology

## 2016-09-10 NOTE — Telephone Encounter (Signed)
Appointments scheduled per 2/6 LOS. Patient notified.

## 2016-09-21 ENCOUNTER — Encounter (INDEPENDENT_AMBULATORY_CARE_PROVIDER_SITE_OTHER): Payer: Medicare Other | Admitting: Ophthalmology

## 2016-11-09 ENCOUNTER — Encounter (INDEPENDENT_AMBULATORY_CARE_PROVIDER_SITE_OTHER): Payer: Medicare Other | Admitting: Ophthalmology

## 2016-11-09 DIAGNOSIS — H353231 Exudative age-related macular degeneration, bilateral, with active choroidal neovascularization: Secondary | ICD-10-CM

## 2016-11-09 DIAGNOSIS — H43813 Vitreous degeneration, bilateral: Secondary | ICD-10-CM | POA: Diagnosis not present

## 2016-12-07 DIAGNOSIS — I639 Cerebral infarction, unspecified: Secondary | ICD-10-CM | POA: Diagnosis not present

## 2016-12-07 DIAGNOSIS — M1710 Unilateral primary osteoarthritis, unspecified knee: Secondary | ICD-10-CM | POA: Diagnosis not present

## 2016-12-07 DIAGNOSIS — I160 Hypertensive urgency: Secondary | ICD-10-CM | POA: Diagnosis not present

## 2016-12-07 DIAGNOSIS — J449 Chronic obstructive pulmonary disease, unspecified: Secondary | ICD-10-CM | POA: Diagnosis not present

## 2016-12-08 DIAGNOSIS — J449 Chronic obstructive pulmonary disease, unspecified: Secondary | ICD-10-CM | POA: Diagnosis not present

## 2016-12-08 DIAGNOSIS — I160 Hypertensive urgency: Secondary | ICD-10-CM | POA: Diagnosis not present

## 2016-12-08 DIAGNOSIS — I639 Cerebral infarction, unspecified: Secondary | ICD-10-CM | POA: Diagnosis not present

## 2016-12-08 DIAGNOSIS — M1710 Unilateral primary osteoarthritis, unspecified knee: Secondary | ICD-10-CM | POA: Diagnosis not present

## 2017-02-23 ENCOUNTER — Encounter (HOSPITAL_COMMUNITY): Payer: Self-pay | Admitting: Emergency Medicine

## 2017-02-23 ENCOUNTER — Emergency Department (HOSPITAL_COMMUNITY)
Admission: EM | Admit: 2017-02-23 | Discharge: 2017-02-23 | Disposition: A | Discharge status: Home / Self Care | Payer: Medicare Other | Attending: Emergency Medicine | Admitting: Emergency Medicine

## 2017-02-23 DIAGNOSIS — Z79899 Other long term (current) drug therapy: Secondary | ICD-10-CM | POA: Insufficient documentation

## 2017-02-23 DIAGNOSIS — M545 Low back pain, unspecified: Secondary | ICD-10-CM

## 2017-02-23 DIAGNOSIS — N3000 Acute cystitis without hematuria: Secondary | ICD-10-CM | POA: Insufficient documentation

## 2017-02-23 DIAGNOSIS — G8929 Other chronic pain: Secondary | ICD-10-CM

## 2017-02-23 DIAGNOSIS — Z7982 Long term (current) use of aspirin: Secondary | ICD-10-CM | POA: Diagnosis not present

## 2017-02-23 DIAGNOSIS — Z853 Personal history of malignant neoplasm of breast: Secondary | ICD-10-CM | POA: Insufficient documentation

## 2017-02-23 DIAGNOSIS — Z87891 Personal history of nicotine dependence: Secondary | ICD-10-CM | POA: Insufficient documentation

## 2017-02-23 DIAGNOSIS — J45909 Unspecified asthma, uncomplicated: Secondary | ICD-10-CM | POA: Insufficient documentation

## 2017-02-23 LAB — URINALYSIS, ROUTINE W REFLEX MICROSCOPIC
Bilirubin Urine: NEGATIVE
GLUCOSE, UA: NEGATIVE mg/dL
Hgb urine dipstick: NEGATIVE
KETONES UR: NEGATIVE mg/dL
Nitrite: NEGATIVE
PROTEIN: NEGATIVE mg/dL
Specific Gravity, Urine: 1.006 (ref 1.005–1.030)
pH: 6 (ref 5.0–8.0)

## 2017-02-23 MED ORDER — HYDROCODONE-ACETAMINOPHEN 5-325 MG PO TABS: 1.0000 | ORAL_TABLET | ORAL | 0 refills | Status: DC | PRN

## 2017-02-23 MED ORDER — FENTANYL CITRATE (PF) 100 MCG/2ML IJ SOLN
50.0000 ug | Freq: Once | INTRAMUSCULAR | Status: AC
  Administered 2017-02-23: 50 ug via INTRAVENOUS
  Filled 2017-02-23: qty 2

## 2017-02-23 MED ORDER — CEPHALEXIN 500 MG PO CAPS: 500.0000 mg | ORAL_CAPSULE | Freq: Two times a day (BID) | ORAL | 0 refills | Status: DC

## 2017-02-23 NOTE — ED Provider Notes (Signed)
[facility] DEPT Provider Note   CSN: [id number] Arrival date & time: 02/23/17  1326     History   Chief Complaint Chief Complaint  Patient presents with  . Back Pain    HPI Sandra Ferguson is a 83 y.o. female.  HPI  Patient is a 83 year old female with history of osteoporosis, lung cancer status post lobectomy and chemotherapy, breast cancer status post lumpectomy and radiation, asthma and degenerative joint disease who presents the ED with complaint of low back pain. Patient reports having chronic intermittent low back pain but notes over the past 2 weeks she has been having constant sharp pain to her right lower back that intermittently radiates down the back of her leg. Pt states over the past few days her pain has intermittently radiated around to the front of her thigh. She notes her pain significantly worsened over the past 2 days. She reports pain is worse when standing upright and notes she has had to walk bent over due to her pain. Denies any recent fall, trauma or injury. Patient notes she was initially seen in the ED at [facility] a few days after onset of symptoms. Daughter at bedside reports patient had a CT scan performed which revealed herniated disc. Pt was d/c home with prednisone and tramadol. Daughter reports patient has only been taking prednisone without relief however she notes she took one dose of tramadol yesterday and one this morning without improvement of symptoms. Reports also taking ibuprofen and motrin at home without relief. Pt also notes she has has mild intermittent dysuria for the past few days. Pt denies fever, CP, SOB, cough, abdominal pain, N/V, hematuria, flank pain, numbness, tingling, saddle anesthesia, loss of bowel or bladder, weakness, IVDU or recent spinal manipulation. Denies hx of spinal surgeries.   Past Medical History:  Diagnosis Date  . Arthritis   . Asthma    very little  . Breast cancer ([place])   . Breast cancer, left breast  (HCC)    Invasive Carcinoma; 0/2 Nodes Negative; Er100%, PR 8 % K1-67 13%, Her-2New No amplification  . Diverticulosis   . DJD (degenerative joint disease)   . Full dentures   . Gastritis   . Interstitial cystitis   . Lung cancer [facility]) 2008   Small Cell LUng Cancer Right Upper Lobe  . Macular degeneration 2011  . Osteoporosis   . Phlebitis 1970  . S/P radiation therapy 11/27/2012-12/25/2012   Left Breast / 40.05Gy in 15 fractions/Left Breast Boost / 10 Gy in 5 fractions  . Shortness of breath   . Status post chemotherapy 10/03/2006 - 12/05/2006   5 Cycles of Adjuvant Carboplatin/ VP-16 with Neulasta Support   . Wears glasses     Patient Active Problem List   Diagnosis Date Noted  . Malignant neoplasm of upper-outer quadrant of female breast ([place]) 03/07/2014  . Urinary frequency 12/31/2012  . Osteoporosis 09/05/2012  . History of left breast cancer 08/15/2012  . Small cell lung cancer ([place]) 08/25/2011    Past Surgical History:  Procedure Laterality Date  . BREAST LUMPECTOMY WITH NEEDLE LOCALIZATION AND AXILLARY SENTINEL LYMPH NODE BX  08/21/2012   Procedure: BREAST LUMPECTOMY WITH NEEDLE LOCALIZATION AND AXILLARY SENTINEL LYMPH NODE BX;  Surgeon: Stark Klein, MD;  Location: [facility];  Service: General;  Laterality: Left;  . BREAST SURGERY    . CHOLECYSTECTOMY     laparoscopic  . EYE SURGERY     rt cataract  . right upper lobectomy  09/01/2006  OB History    No data available       Home Medications    Prior to Admission medications   Medication Sig Start Date End Date Taking? Authorizing Provider  ALPRAZolam Duanne Moron) 0.5 MG tablet Take 0.5 mg by mouth 2 (two) times daily as needed. 1/2 to 1 tab po bid for anxiety  10/27/10   [provider]  aspirin 81 MG tablet Take 81 mg by mouth daily.    [provider]  CALCIUM-VITAMIN D PO Take 1 tablet by mouth daily. 1000 mg    [provider]  cephALEXin (KEFLEX) 500 MG capsule Take 1  capsule (500 mg total) by mouth 2 (two) times daily. 02/23/17   Nona Dell, PA-C  cetirizine (ZYRTEC) 10 MG tablet Take 10 mg by mouth daily.      [provider]  cholecalciferol (VITAMIN D) 1000 UNITS tablet Take 3,000 Units by mouth daily.     [provider]  HYDROcodone-acetaminophen (NORCO/VICODIN) 5-325 MG tablet Take 1 tablet by mouth every 4 (four) hours as needed. 02/23/17   Nona Dell, PA-C  naproxen sodium (ANAPROX) 220 MG tablet Take 220 mg by mouth as needed.    [provider]  Omega-3 Fatty Acids (OMEGA 3 PO) Take 3,000 mg by mouth daily.      [provider]  polyethylene glycol (MIRALAX / GLYCOLAX) packet Take 17 g by mouth daily as needed.    [provider]  Sennosides (SENOKOT PO) Take 1 tablet by mouth at bedtime.    [provider]    Family History Family History  Problem Relation Age of Onset  . Breast cancer Mother   . Breast cancer Sister        30 -sister (paternal side of family)  . Cancer Maternal Grandfather   . Breast cancer Maternal Grandmother   . Breast cancer Sister     Social History Social History  Substance Use Topics  . Smoking status: Former Smoker    Quit date: 08/16/1981  . Smokeless tobacco: Former Systems developer  . Alcohol use No     Allergies   Morphine sulfate   Review of Systems Review of Systems  Genitourinary: Positive for dysuria.  Musculoskeletal: Positive for back pain.  Neurological:       Tingling (chronic)  All other systems reviewed and are negative.    Physical Exam Updated Vital Signs BP (!) 149/57   Pulse 73   Temp 98.8 F (37.1 C) (Oral)   Resp 18   SpO2 (!) 88%   Physical Exam  Constitutional: She is oriented to person, place, and time. She appears well-developed and well-nourished. No distress.  HENT:  Head: Normocephalic and atraumatic.  Mouth/Throat: Oropharynx is clear and moist. No oropharyngeal exudate.  Eyes:  Conjunctivae and EOM are normal. Right eye exhibits no discharge. Left eye exhibits no discharge. No scleral icterus.  Neck: Normal range of motion. Neck supple.  Cardiovascular: Normal rate, regular rhythm, normal heart sounds and intact distal pulses.   Pulmonary/Chest: Effort normal and breath sounds normal. No respiratory distress. She has no wheezes. She has no rales. She exhibits no tenderness.  Abdominal: Soft. She exhibits no distension and no mass. There is no tenderness. There is no rebound and no guarding.  Musculoskeletal: Normal range of motion. She exhibits tenderness. She exhibits no edema or deformity.  No midline C, T, or L tenderness. Mild TTP over right lumbar paraspinal muscles. Full range of motion of neck and  dec ROM of back due to reported pain. Full range of motion of bilateral upper and lower extremities, with 5/5 strength. FROM of bilateral hips, nontender, no pelvic instability. Sensation intact. 2+ radial and PT pulses. Cap refill <2 seconds.  Neurological: She is alert and oriented to person, place, and time. She has normal strength. No sensory deficit.  Skin: Skin is warm and dry. She is not diaphoretic.  Nursing note and vitals reviewed.    ED Treatments / Results  Labs (all labs ordered are listed, but only abnormal results are displayed) Labs Reviewed  URINALYSIS, ROUTINE W REFLEX MICROSCOPIC - Abnormal; Notable for the following:       Result Value   APPearance HAZY (*)    Leukocytes, UA LARGE (*)    Bacteria, UA RARE (*)    Squamous Epithelial / LPF 0-5 (*)    All other components within normal limits  URINE CULTURE    EKG  EKG Interpretation None       Radiology No results found.  Procedures Procedures (including critical care time)  Medications Ordered in ED Medications  fentaNYL (SUBLIMAZE) injection 50 mcg (50 mcg Intravenous Given 02/23/17 1425)     Initial Impression / Assessment and Plan / ED Course  I have reviewed the triage  vital signs and the nursing notes.  Pertinent labs & imaging results that were available during my care of the patient were reviewed by me and considered in my medical decision making (see chart for details).    Pt presents with gradually worsening low back pain for the past 3 weeks. No back pain red flags. Pt was seen in the ED at [facility] 3 weeks ago for back pain, CT scan reportedly showed herniated disc and pt was d/c home with prednisone and tramadol which she has been taking without relief. Denies fever. Endorses intermittent mild dysuria. VSS. Exam revealed mild TTP over right lumbar paraspinal muscles, no midline spinal tenderness. No neuro deficits. Abdomen soft and nontender. Pt given pain meds in the ED. Discussed pt with Dr. Oleta Mouse, plan to order UA and contact [facility] to get a copy of pt's recent spinal imaging.   Faxed CT L Spine W/O Contrast performed on 02/08/17 showed "no acute fx or malalignment; degenerative changes of lumbar spine showing severe canal stenosis L3-4 and L4-5 affecting traversing L4 and L5 nerves; neural foraminal narrowing L2-3 through L5-S1 severe on right at L3-4; osteopenia." UA showed evidence of UTI, urine culture sent, will start pt on abx.   On reevaluation patient reports improvement of pain. Patient able to stand and ambulate without assistance. Suspect patient's back pain is related to chronic degenerative changes reported in CT scan. Do not suspect spinal cord compression or cauda equina syndrome at this time and do not feel that further workup or imaging is warranted. Plan to discharge patient home with pain meds and advised to follow-up with neurosurgery for further management. Discussed results and plan for discharge with patient and family at bedside. Discussed return precautions.  Final Clinical Impressions(s) / ED Diagnoses   Final diagnoses:  Chronic right-sided low back pain without sciatica  Acute cystitis without hematuria    New  Prescriptions New Prescriptions   CEPHALEXIN (KEFLEX) 500 MG CAPSULE    Take 1 capsule (500 mg total) by mouth 2 (two) times daily.   HYDROCODONE-ACETAMINOPHEN (NORCO/VICODIN) 5-325 MG TABLET    Take 1 tablet by mouth every 4 (four) hours as needed.     Nona Dell, PA-C  02/23/17 1612    Forde Dandy, MD 02/23/17 1840

## 2017-02-23 NOTE — ED Triage Notes (Addendum)
PT coming from home for chronic sciatica/ back pain on Right side. Took a tramadol before calling EMS. Denies any other complaints. VSS; CBG 101

## 2017-02-23 NOTE — ED Notes (Signed)
Ambulated pt from room to restroom, tolerated well.

## 2017-02-23 NOTE — Discharge Instructions (Signed)
Take your medications as prescribed as needed for pain relief. Called the neurosurgery clinic listed below to schedule a follow-up appointment for further management of your chronic back pain. Please return to the Emergency Department if symptoms worsen or new onset of fever, numbness, tingling, groin anesthesia, loss of bowel or bladder, weakness.

## 2017-02-23 NOTE — ED Notes (Signed)
ED Provider at bedside. 

## 2017-02-24 LAB — URINE CULTURE

## 2017-02-25 ENCOUNTER — Telehealth: Payer: Self-pay

## 2017-02-25 NOTE — Telephone Encounter (Signed)
Post ED Visit - Positive Culture Follow-up  Culture report reviewed by antimicrobial stewardship pharmacist:  []  Elenor Quinones, Pharm.D. []  Heide Guile, Pharm.D., BCPS AQ-ID []  Parks Neptune, Pharm.D., BCPS []  Alycia Rossetti, Pharm.D., BCPS []  [place], Pharm.D., BCPS, AAHIVP []  Legrand Como, Pharm.D., BCPS, AAHIVP []  Salome Arnt, PharmD, BCPS []  Dimitri Ped, PharmD, BCPS []  Vincenza Hews, PharmD, BCPS [facility] D Positive urine culture Treated with Cephalexin, organism sensitive to the same and no further patient follow-up is required at this time.  Genia Del 02/25/2017, 8:52 AM

## 2017-03-15 ENCOUNTER — Encounter (INDEPENDENT_AMBULATORY_CARE_PROVIDER_SITE_OTHER): Payer: Medicare Other | Admitting: Ophthalmology

## 2017-03-27 ENCOUNTER — Encounter (INDEPENDENT_AMBULATORY_CARE_PROVIDER_SITE_OTHER): Payer: Medicare Other | Admitting: Ophthalmology

## 2017-04-06 ENCOUNTER — Encounter (INDEPENDENT_AMBULATORY_CARE_PROVIDER_SITE_OTHER): Payer: Medicare Other | Admitting: Ophthalmology

## 2017-04-06 DIAGNOSIS — H353231 Exudative age-related macular degeneration, bilateral, with active choroidal neovascularization: Secondary | ICD-10-CM

## 2017-04-06 DIAGNOSIS — H43813 Vitreous degeneration, bilateral: Secondary | ICD-10-CM | POA: Diagnosis not present

## 2017-05-10 ENCOUNTER — Encounter (INDEPENDENT_AMBULATORY_CARE_PROVIDER_SITE_OTHER): Payer: Medicare Other | Admitting: Ophthalmology

## 2017-05-19 ENCOUNTER — Encounter (INDEPENDENT_AMBULATORY_CARE_PROVIDER_SITE_OTHER): Payer: Medicare Other | Admitting: Ophthalmology

## 2017-05-19 DIAGNOSIS — H353231 Exudative age-related macular degeneration, bilateral, with active choroidal neovascularization: Secondary | ICD-10-CM

## 2017-05-19 DIAGNOSIS — H43813 Vitreous degeneration, bilateral: Secondary | ICD-10-CM | POA: Diagnosis not present

## 2017-06-30 ENCOUNTER — Encounter (INDEPENDENT_AMBULATORY_CARE_PROVIDER_SITE_OTHER): Payer: Medicare Other | Admitting: Ophthalmology

## 2017-07-17 ENCOUNTER — Encounter (INDEPENDENT_AMBULATORY_CARE_PROVIDER_SITE_OTHER): Payer: Medicare Other | Admitting: Ophthalmology

## 2017-07-17 DIAGNOSIS — H353231 Exudative age-related macular degeneration, bilateral, with active choroidal neovascularization: Secondary | ICD-10-CM | POA: Diagnosis not present

## 2017-07-17 DIAGNOSIS — H43813 Vitreous degeneration, bilateral: Secondary | ICD-10-CM

## 2017-08-17 ENCOUNTER — Encounter (INDEPENDENT_AMBULATORY_CARE_PROVIDER_SITE_OTHER): Payer: Medicare Other | Admitting: Ophthalmology

## 2017-08-18 ENCOUNTER — Telehealth: Payer: Self-pay | Admitting: Hematology

## 2017-08-18 NOTE — Telephone Encounter (Signed)
Lvm advising appt chgd from 2/5 to 2/27 @ 12.15.

## 2017-09-05 ENCOUNTER — Other Ambulatory Visit: Payer: Medicare Other

## 2017-09-05 ENCOUNTER — Ambulatory Visit: Payer: Medicare Other | Admitting: Hematology

## 2017-09-20 ENCOUNTER — Encounter (INDEPENDENT_AMBULATORY_CARE_PROVIDER_SITE_OTHER): Payer: Medicare Other | Admitting: Ophthalmology

## 2017-09-26 ENCOUNTER — Other Ambulatory Visit: Payer: Self-pay | Admitting: *Deleted

## 2017-09-26 DIAGNOSIS — C50411 Malignant neoplasm of upper-outer quadrant of right female breast: Secondary | ICD-10-CM

## 2017-09-27 ENCOUNTER — Other Ambulatory Visit: Payer: Medicare Other

## 2017-09-27 ENCOUNTER — Telehealth: Payer: Self-pay | Admitting: *Deleted

## 2017-09-27 ENCOUNTER — Ambulatory Visit: Payer: Medicare Other | Admitting: Hematology

## 2017-09-27 NOTE — Telephone Encounter (Signed)
Left message regarding missed appt for today & asked her to call back.

## 2017-09-28 ENCOUNTER — Other Ambulatory Visit: Payer: Self-pay | Admitting: Hematology

## 2017-10-05 ENCOUNTER — Encounter (INDEPENDENT_AMBULATORY_CARE_PROVIDER_SITE_OTHER): Payer: Medicare Other | Admitting: Ophthalmology

## 2017-10-05 DIAGNOSIS — H353231 Exudative age-related macular degeneration, bilateral, with active choroidal neovascularization: Secondary | ICD-10-CM | POA: Diagnosis not present

## 2017-10-05 DIAGNOSIS — H43813 Vitreous degeneration, bilateral: Secondary | ICD-10-CM

## 2017-10-11 ENCOUNTER — Inpatient Hospital Stay (HOSPITAL_BASED_OUTPATIENT_CLINIC_OR_DEPARTMENT_OTHER): Payer: Medicare Other | Admitting: Hematology

## 2017-10-11 ENCOUNTER — Encounter: Payer: Self-pay | Admitting: Hematology

## 2017-10-11 ENCOUNTER — Inpatient Hospital Stay: Payer: Medicare Other | Attending: Hematology

## 2017-10-11 VITALS — BP 151/62 | HR 83 | Temp 98.0°F | Resp 18 | Ht 63.0 in | Wt 157.1 lb

## 2017-10-11 DIAGNOSIS — Z9223 Personal history of estrogen therapy: Secondary | ICD-10-CM | POA: Insufficient documentation

## 2017-10-11 DIAGNOSIS — Z853 Personal history of malignant neoplasm of breast: Secondary | ICD-10-CM

## 2017-10-11 DIAGNOSIS — M810 Age-related osteoporosis without current pathological fracture: Secondary | ICD-10-CM | POA: Insufficient documentation

## 2017-10-11 DIAGNOSIS — D225 Melanocytic nevi of trunk: Secondary | ICD-10-CM | POA: Diagnosis not present

## 2017-10-11 DIAGNOSIS — Z85118 Personal history of other malignant neoplasm of bronchus and lung: Secondary | ICD-10-CM | POA: Diagnosis not present

## 2017-10-11 DIAGNOSIS — Z1231 Encounter for screening mammogram for malignant neoplasm of breast: Secondary | ICD-10-CM

## 2017-10-11 DIAGNOSIS — Z9221 Personal history of antineoplastic chemotherapy: Secondary | ICD-10-CM | POA: Insufficient documentation

## 2017-10-11 DIAGNOSIS — Z79899 Other long term (current) drug therapy: Secondary | ICD-10-CM | POA: Diagnosis not present

## 2017-10-11 DIAGNOSIS — M1611 Unilateral primary osteoarthritis, right hip: Secondary | ICD-10-CM | POA: Insufficient documentation

## 2017-10-11 DIAGNOSIS — Z7982 Long term (current) use of aspirin: Secondary | ICD-10-CM | POA: Diagnosis not present

## 2017-10-11 DIAGNOSIS — C50411 Malignant neoplasm of upper-outer quadrant of right female breast: Secondary | ICD-10-CM

## 2017-10-11 DIAGNOSIS — Z87891 Personal history of nicotine dependence: Secondary | ICD-10-CM | POA: Diagnosis not present

## 2017-10-11 LAB — CMP (CANCER CENTER ONLY)
ALK PHOS: 48 U/L (ref 40–150)
ALT: 10 U/L (ref 0–55)
ANION GAP: 7 (ref 3–11)
AST: 18 U/L (ref 5–34)
Albumin: 3.8 g/dL (ref 3.5–5.0)
BILIRUBIN TOTAL: 0.3 mg/dL (ref 0.2–1.2)
BUN: 12 mg/dL (ref 7–26)
CALCIUM: 9.9 mg/dL (ref 8.4–10.4)
CO2: 29 mmol/L (ref 22–29)
CREATININE: 0.74 mg/dL (ref 0.60–1.10)
Chloride: 100 mmol/L (ref 98–109)
Glucose, Bld: 107 mg/dL (ref 70–140)
Potassium: 4.5 mmol/L (ref 3.5–5.1)
SODIUM: 136 mmol/L (ref 136–145)
TOTAL PROTEIN: 6.5 g/dL (ref 6.4–8.3)

## 2017-10-11 LAB — CBC WITH DIFFERENTIAL (CANCER CENTER ONLY)
Basophils Absolute: 0 10*3/uL (ref 0.0–0.1)
Basophils Relative: 0 %
EOS ABS: 0.1 10*3/uL (ref 0.0–0.5)
Eosinophils Relative: 2 %
HCT: 36.6 % (ref 34.8–46.6)
HEMOGLOBIN: 12.6 g/dL (ref 11.6–15.9)
LYMPHS ABS: 1.4 10*3/uL (ref 0.9–3.3)
LYMPHS PCT: 25 %
MCH: 31.6 pg (ref 25.1–34.0)
MCHC: 34.4 g/dL (ref 31.5–36.0)
MCV: 91.7 fL (ref 79.5–101.0)
Monocytes Absolute: 0.5 10*3/uL (ref 0.1–0.9)
Monocytes Relative: 9 %
NEUTROS PCT: 64 %
Neutro Abs: 3.6 10*3/uL (ref 1.5–6.5)
Platelet Count: 185 10*3/uL (ref 145–400)
RBC: 3.99 MIL/uL (ref 3.70–5.45)
RDW: 13 % (ref 11.2–14.5)
WBC: 5.6 10*3/uL (ref 3.9–10.3)

## 2017-10-11 NOTE — Progress Notes (Signed)
[facility]  Hematology and Oncology Follow Up Visit  Sandra Ferguson [id number] 1926/10/31 84 y.o. 10/11/2017 11:35 PM Merrilee Seashore, MD  Chief Complaint:  Follow up invasive carcinoma of the left breast   Oncological history 1) Small cell carcinoma of the right upper lobe (stage IA) status post right upper lobectomy on 09/01/2006 by Dr. Pierre Bali.  2) Invasive carcinoma of the left breast detected by mammogram at [facility] on 08/06/2012.  Prior Therapy:    1) For her SCC of RUL, she received 4 cycles of adjuvant carboplatin VP-16 in combination with Neulasta from 10/03/2006 through 12/05/2006. Now NED.  2) For her Breast cancer, she underwent definitive surgery by Stark Klein, on 08/21/2012. This consisted of a lumpectomy and sentinel node sampling. The tumor was 1.9 cm, grade II/III. Closest margin was 1 mm at the superior margin and 2 mm at the posterior margin. Both sentinel lymph nodes were negative. Pathologic stage was pTIc, pN0, IA. The patient completed radiation treatments to the left breast on 12/25/2012.  I believe she received a total dose of 5005 cGy.  Arimidex 1 mg daily was initiated on 12/31/2012 but stopped one week later because of non-specific symptoms including fatigue, malaise.   Current therapy:  Observation   Interim History:   Sandra Ferguson is here for follow up. She presents to the clinic today accompanied by her daughter. She reports she is doing well overall other than some arthritis in her right hip and knee. She is able to take care of herself. She has children that visit her frequently. She notes to do routine self-breast exam. She uses a cane or a walker as needed. Her daughter reports that she has a few black spots on her back that are new.   On review of systems, pt denies any other complaints at this time. Pertinent positives are listed and detailed within the above HPI.   Medications: Current Outpatient Medications   Medication Sig Dispense Refill  . ALPRAZolam (XANAX) 0.5 MG tablet Take 0.5 mg by mouth 2 (two) times daily as needed. 1/2 to 1 tab po bid for anxiety     . aspirin 81 MG tablet Take 81 mg by mouth daily.    [name] CALCIUM-VITAMIN D PO Take 1 tablet by mouth daily. 1000 mg    . cetirizine (ZYRTEC) 10 MG tablet Take 10 mg by mouth daily.      . cholecalciferol (VITAMIN D) 1000 UNITS tablet Take 3,000 Units by mouth daily.     . naproxen sodium (ANAPROX) 220 MG tablet Take 220 mg by mouth as needed.    . Omega-3 Fatty Acids (OMEGA 3 PO) Take 3,000 mg by mouth daily.      . polyethylene glycol (MIRALAX / GLYCOLAX) packet Take 17 g by mouth daily as needed.    . Sennosides (SENOKOT PO) Take 1 tablet by mouth at bedtime.     No current facility-administered medications for this visit.    Allergies:  Allergies  Allergen Reactions  . Morphine Sulfate Shortness Of Breath    Hallucinations and convulsions    Past Medical History, Surgical history, Social history, and Family History were reviewed and updated.  IMMUNIZATIONS:  The patient does not know if she has ever had a pneumonia shot. A flu shot was given in the fall of 2013.  SMOKING HISTORY: Patient smoked up to 2 packs of cigarettes a day for 40 years. She stopped smoking in 1985.  Review of Systems:  Review of Systems  Constitutional: Negative.   HENT: Negative.   Eyes: Negative.   Respiratory: Negative.   Cardiovascular: Negative.   Gastrointestinal: Negative.   Genitourinary: Negative.   Musculoskeletal: Joint pain: R knee.  Skin: Negative.   Neurological: Negative.   Endo/Heme/Allergies: Negative.   Psychiatric/Behavioral: Negative.   All other systems reviewed and are negative.  A 10 point review of systems was conducted and is otherwise negative except for what is noted above.    Physical Exam:  Blood pressure (!) 151/62, pulse 83, temperature 98 F (36.7 C), temperature source Oral, resp. rate 18, height _0  (1.6 m),  weight 157 lb 1.6 oz (71.3 kg), SpO2 95 %.   ECOG: 1 GENERAL: Patient is a well appearing female in no acute distress HEENT:  Sclerae anicteric.  Oropharynx clear and moist. No ulcerations or evidence of oropharyngeal candidiasis. Neck is supple.  NODES:  No cervical, supraclavicular, or axillary lymphadenopathy palpated.  BREAST EXAM:  Left breast s/p lumpectomy, no nodularity or sign of recurrence, no masses, right breast without noduels or masses.   LUNGS:  Clear to auscultation bilaterally.  No wheezes or rhonchi. HEART:  Regular rate and rhythm. No murmur appreciated. ABDOMEN:  Soft, nontender.  Positive, normoactive bowel sounds. No organomegaly palpated. MSK:  No focal spinal tenderness to palpation. Full range of motion bilaterally in the upper extremities. EXTREMITIES:  No peripheral edema.   SKIN:  Clear with no obvious rashes. No nail dyscrasia. (+) She has 2 5-10 mm moles on her back. 1 mole is irregular. No discharge or wound.  NEURO:  Nonfocal. Well oriented.  Appropriate affect.  Lab Results: CBC Latest Ref Rng & Units 10/11/2017 09/06/2016 09/04/2015  WBC 3.9 - 10.3 K/uL 5.6 6.2 5.8  Hemoglobin 11.6 - 15.9 g/dL - 13.6 13.3  Hematocrit 34.8 - 46.6 % 36.6 40.7 38.9  Platelets 145 - 400 K/uL 185 207 197    CMP Latest Ref Rng & Units 10/11/2017 09/06/2016 09/04/2015  Glucose 70 - 140 mg/dL 107 101 101  BUN 7 - 26 mg/dL 12 13.7 10.4  Creatinine 0.60 - 1.10 mg/dL 0.74 0.7 0.8  Sodium 136 - 145 mmol/L 136 137 138  Potassium 3.5 - 5.1 mmol/L 4.5 4.6 4.2  Chloride 98 - 109 mmol/L 100 - -  CO2 22 - 29 mmol/L _1 Calcium 8.4 - 10.4 mg/dL 9.9 10.4 9.1  Total Protein 6.4 - 8.3 g/dL 6.5 6.9 7.1  Total Bilirubin 0.2 - 1.2 mg/dL 0.3 0.32 <0.30  Alkaline Phos 40 - 150 U/L 48 59 42  AST 5 - 34 U/L _2 ALT 0 - 55 U/L _3 Radiological Studies: 1. CT scan of the chest with IV contrast on 01/08/2009 showed no significant changes compared with the prior exam of  11/29/2007.  There were some tiny nodules in both lungs that were unchanged.  2. Chest x-ray from 08/04/2009 showed no abnormalities or evidence for metastatic disease.  3. A CT scan of the chest with IV contrast from 12/08/2009 showed no evidence for metastatic disease.  4. Two-view chest x-ray from 12/09/2010 showed no active cardiopulmonary disease.  5. Bilateral digital screening mammograms from 01/10/2011 carried out at [facility] concluded that the right breast demonstrated no abnormalities. Additional imaging of the left breast was recommended and was subsequently negative.  6. Unilateral left breast mammogram was carried out at [facility] on 08/06/2012. There was a 4 x  9 mm hypoechoic mass seen at the 2:00 position located 2 cm from the nipple. Biopsy was recommended.  7. Chest x-ray, 2 view, from 02/20/2012 showed surgical changes of a prior right upper lobectomy with no suspicious findings or evidence of acute cardiopulmonary disease.  8. Chest x-ray, 2 view, from 10/24/2012 showed stable postoperative appearance of the right lung and no acute cardiopulmonary abnormalities. 9. Bone density scan carried out at the [place] on 11/13/2012 showed a T-score of the lumbar spine of -1.7.  T-score of the left hip was -2.5.  No prior scans were available for comparison.  ASSESSMENT AND PLAN: 84 y.o. female with:  1. Left Breast cancer, pT1cN0M0, stage IA, ER100%, PR 8%+, HER2- , diagnosed in 08/2012 -I previously reviewed her history of early stage breast cancer. She is likely cured by complete surgical resection. -She did try Anastrozole, but could not tolerate and stopped it. -She is clinically doing very well, her labs were reviewed with her, they are WNL. Her breast exam was unremarkable, last mammogram was in May 2018. There is no clinical concern for recurrence -We'll continue breast cancer surveillance. I encouraged her to continue annual screening  mammogram, she is due in May 2019, I'll order one for her. She can have a screening mammogram  -She is 5 years since her diagnosis, I am OK to discharge her. However she is not seeing her PCP and she would prefer to f/u with me.  -will continue annual F/u   2. SCC of RUL in 2008 -She received 4 cycles of adjuvant carboplatin VP-16 in combination with Neulasta from 10/03/2006 through 12/05/2006. Now NED. No need for routine surveillance scan.  3. Osteoporosis.  -She will continue on vitamin D supplementation. She will need a follow up bone density scan soon   Plan -Screening Mammogram in 11/2017 at [facility]  -F/u in 1 year with lab  -F/u with dermatologist for her moles on back    The above plan was reviewed with the patient and her son in detail.  They were in agreement.   She knows to call us in the interim for any questions or concerns.  We can certainly see her sooner if needed.  I spent 15 minutes counseling the patient face to face.  The total time spent in the appointment was 20 minutes.  This document serves as a record of services personally performed by Truitt Merle, MD. It was created on her behalf by Theresia Bough, a trained medical scribe. The creation of this record is based on the scribe's personal observations and the provider's statements to them.   I have reviewed the above documentation for accuracy and completeness, and I agree with the above.   Truitt Merle  10/11/17

## 2017-12-21 ENCOUNTER — Encounter (INDEPENDENT_AMBULATORY_CARE_PROVIDER_SITE_OTHER): Payer: Medicare Other | Admitting: Ophthalmology

## 2017-12-21 DIAGNOSIS — H43813 Vitreous degeneration, bilateral: Secondary | ICD-10-CM | POA: Diagnosis not present

## 2017-12-21 DIAGNOSIS — H353231 Exudative age-related macular degeneration, bilateral, with active choroidal neovascularization: Secondary | ICD-10-CM

## 2017-12-28 ENCOUNTER — Encounter (INDEPENDENT_AMBULATORY_CARE_PROVIDER_SITE_OTHER): Payer: Medicare Other | Admitting: Ophthalmology

## 2018-01-09 ENCOUNTER — Other Ambulatory Visit: Payer: Self-pay | Admitting: Internal Medicine

## 2018-01-09 ENCOUNTER — Encounter: Payer: Self-pay | Admitting: Hematology

## 2018-01-09 DIAGNOSIS — Z85118 Personal history of other malignant neoplasm of bronchus and lung: Secondary | ICD-10-CM

## 2018-01-19 ENCOUNTER — Other Ambulatory Visit: Payer: Medicare Other

## 2018-01-19 ENCOUNTER — Ambulatory Visit
Admission: RE | Admit: 2018-01-19 | Discharge: 2018-01-19 | Disposition: A | Discharge status: Home / Self Care | Payer: Medicare Other | Source: Ambulatory Visit | Attending: Internal Medicine | Admitting: Internal Medicine

## 2018-01-19 DIAGNOSIS — Z85118 Personal history of other malignant neoplasm of bronchus and lung: Secondary | ICD-10-CM

## 2018-01-19 MED ORDER — IOPAMIDOL (ISOVUE-300) INJECTION 61%
75.0000 mL | Freq: Once | INTRAVENOUS | Status: AC | PRN
  Administered 2018-01-19: 75 mL via INTRAVENOUS

## 2018-02-21 ENCOUNTER — Ambulatory Visit (INDEPENDENT_AMBULATORY_CARE_PROVIDER_SITE_OTHER): Payer: Medicare Other | Admitting: Pulmonary Disease

## 2018-02-21 ENCOUNTER — Encounter: Payer: Self-pay | Admitting: Pulmonary Disease

## 2018-02-21 DIAGNOSIS — R911 Solitary pulmonary nodule: Secondary | ICD-10-CM | POA: Diagnosis not present

## 2018-02-21 DIAGNOSIS — J432 Centrilobular emphysema: Secondary | ICD-10-CM

## 2018-02-21 NOTE — Progress Notes (Signed)
Subjective:    Patient ID: Sandra Ferguson, female    DOB: 1927/07/01, 84 y.o.   MRN: [id number]  HPI  Chief Complaint  Patient presents with  . Pulm Consult    Referred by [facility] for left lung nodule. Per patient, if she does do a lot of walking she will get SOB.    84 year old remote smoker presents for evaluation of abnormal CT of the chest. Accompanied by daughter Sandra Ferguson  She developed chest cold in May which recurred within a few weeks, chest x-ray 5/9 suggested left upper lobe infiltrate which persisted and follow-up imaging in 6/5. She underwent CT chest 6/21 which clarified this to be an ill-defined 1.4 x 1.9 cm irregular nodule in the left upper lobe extending to the pleura.  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 Pierre Bali , then adjuvant chemo  H/o  Invasive carcinoma of the left breast  08/2012 s/p lumpectomy + RT, did not tolerate arimidex  She reports that congestion is resolved, finds it difficult to breathe in 100 humid weather, cannot walk for long distances without shortness of breath and poor balance, ambulates with a cane.  She is having some memory issues and does not remember details of her cancer history which was reviewed from oncology notes  She smoked about 30 pack years before she quit more than 40 years ago and continues to use smokeless tobacco.  She worked in [employer]      Past Medical History:  Diagnosis Date  . Arthritis   . Asthma    very little  . Breast cancer ([place])   . Breast cancer, left breast ([facility])    Invasive Carcinoma; 0/2 Nodes Negative; Er100%, PR 8 % K1-67 13%, Her-2New No amplification  . Diverticulosis   . DJD (degenerative joint disease)   . Full dentures   . Gastritis   . Interstitial cystitis   . Lung cancer [facility]) 2008   Small Cell LUng Cancer Right Upper Lobe   . Macular degeneration 2011  . Osteoporosis   . Phlebitis 1970  . S/P radiation therapy 11/27/2012-12/25/2012   Left Breast / 40.05Gy in 15 fractions/Left Breast Boost / 10 Gy in 5 fractions  . Shortness of breath   . Status post chemotherapy 10/03/2006 - 12/05/2006   5 Cycles of Adjuvant Carboplatin/ VP-16 with Neulasta Support   . Wears glasses     Past Surgical History:  Procedure Laterality Date  . BREAST LUMPECTOMY WITH NEEDLE LOCALIZATION AND AXILLARY SENTINEL LYMPH NODE BX  08/21/2012   Procedure: BREAST LUMPECTOMY WITH NEEDLE LOCALIZATION AND AXILLARY SENTINEL LYMPH NODE BX;  Surgeon: Stark Klein, MD;  Location: [facility];  Service: General;  Laterality: Left;  . BREAST SURGERY    . CHOLECYSTECTOMY     laparoscopic  . EYE SURGERY     rt cataract  . right upper lobectomy  09/01/2006    Allergies  Allergen Reactions  . Morphine Sulfate Shortness Of Breath    Hallucinations and convulsions   Social History   Socioeconomic History  . Marital status: Widowed    Spouse name: Not on file  . Number of children: Not on file  . Years of education: Not on file  . Highest education level: Not on file  Occupational History  . Not on file  Social Needs  . Financial resource strain: Not on file  . Food insecurity:    Worry: Not on file    Inability: Not on file  . Transportation needs:    Medical: Not on file    Non-medical: Not on file  Tobacco Use  . Smoking status: Former Smoker    Last attempt to quit: 08/16/1981    Years since quitting: 36.5  . Smokeless tobacco: Former Network engineer and Sexual Activity  . Alcohol use: No  . Drug use: No  . Sexual activity: Not on file  Lifestyle  . Physical activity:    Days per week: Not on file    Minutes per session: Not on file  . Stress: Not on file  Relationships  . Social connections:    Talks on phone: Not on file    Gets together: Not on file    Attends religious service: Not on file    Active member  of club or organization: Not on file    Attends meetings of clubs or organizations: Not on file    Relationship status: Not on file  . Intimate partner violence:    Fear of current or ex partner: Not on file    Emotionally abused: Not on file    Physically abused: Not on file    Forced sexual activity: Not on file  Other Topics Concern  . Not on file  Social History Narrative  . Not on file      Family History  Problem Relation Age of Onset  . Breast cancer Mother   . Breast cancer Sister        11 -sister (paternal side of family)  . Cancer Maternal Grandfather   . Breast cancer Maternal Grandmother   . Breast cancer Sister     Review of Systems  Constitutional: Negative for fever and unexpected weight change.  HENT: Negative for congestion, dental problem, ear pain, nosebleeds, postnasal drip, rhinorrhea, sinus pressure, sneezing, sore throat and trouble swallowing.   Eyes: Negative for redness and itching.  Respiratory: Positive for shortness of breath. Negative for cough, chest tightness and wheezing.   Cardiovascular: Negative for palpitations and leg swelling.  Gastrointestinal: Negative for nausea and vomiting.  Genitourinary: Negative for dysuria.  Musculoskeletal: Negative for joint swelling.  Skin: Negative for rash.  Allergic/Immunologic: Negative.  Negative for environmental allergies, food allergies and immunocompromised state.  Neurological: Negative for headaches.  Hematological: Does not bruise/bleed easily.  Psychiatric/Behavioral: Negative for dysphoric mood. The patient is not nervous/anxious.        Objective:   Physical Exam  Gen. Pleasant, well-nourished, elderly,in no distress ENT - no thrush, no post nasal drip Neck: No JVD, no thyromegaly, no carotid bruits Lungs: no use of accessory muscles, no dullness to percussion, decreased BL without rales or rhonchi  Cardiovascular: Rhythm regular, heart sounds  normal, no murmurs or gallops, no  peripheral edema Musculoskeletal: No deformities, no cyanosis or clubbing        Assessment & Plan:

## 2018-02-21 NOTE — Patient Instructions (Signed)
We discussed possibilities including scar tissue or cancer in the left upper lung.  Schedule PET scan first week of September

## 2018-02-21 NOTE — Assessment & Plan Note (Signed)
We discussed possibilities including radiation related scarring what is concerning for malignancy is that or cancer in the left upper lung. Follow-up chest x-ray 05/2015 did not show the infiltrate and this seems to have developed only in 11/2016 chest x-ray  Schedule PET scan first week of September we discussed Further work-up including biopsies and possibility of radiation therapy should this turn out to be malignancy.  She is not interested in pursuing any kind of invasive work-up.  We will schedule PET scan so we can prognosticate more accurately and then outlined therapy for her.  We will schedule it 2 months from now so that we can look for interval growth

## 2018-02-21 NOTE — Assessment & Plan Note (Signed)
Wishes to avoid bronchodilators unless absolutely necessary

## 2018-02-28 ENCOUNTER — Other Ambulatory Visit: Payer: Self-pay

## 2018-03-14 ENCOUNTER — Encounter (INDEPENDENT_AMBULATORY_CARE_PROVIDER_SITE_OTHER): Payer: Medicare Other | Admitting: Ophthalmology

## 2018-03-14 DIAGNOSIS — H353231 Exudative age-related macular degeneration, bilateral, with active choroidal neovascularization: Secondary | ICD-10-CM | POA: Diagnosis not present

## 2018-03-14 DIAGNOSIS — H35033 Hypertensive retinopathy, bilateral: Secondary | ICD-10-CM | POA: Diagnosis not present

## 2018-03-14 DIAGNOSIS — I10 Essential (primary) hypertension: Secondary | ICD-10-CM | POA: Diagnosis not present

## 2018-03-14 DIAGNOSIS — H43813 Vitreous degeneration, bilateral: Secondary | ICD-10-CM | POA: Diagnosis not present

## 2018-04-06 ENCOUNTER — Encounter (HOSPITAL_COMMUNITY): Payer: Medicare Other

## 2018-04-17 ENCOUNTER — Ambulatory Visit: Payer: Medicare Other | Admitting: Pulmonary Disease

## 2018-04-18 ENCOUNTER — Encounter (HOSPITAL_COMMUNITY)
Admission: RE | Admit: 2018-04-18 | Discharge: 2018-04-18 | Disposition: A | Discharge status: Home / Self Care | Payer: Medicare Other | Source: Ambulatory Visit | Attending: Pulmonary Disease | Admitting: Pulmonary Disease

## 2018-04-18 DIAGNOSIS — R911 Solitary pulmonary nodule: Secondary | ICD-10-CM | POA: Insufficient documentation

## 2018-04-18 LAB — GLUCOSE, CAPILLARY: Glucose-Capillary: 91 mg/dL (ref 70–99)

## 2018-04-18 MED ORDER — FLUDEOXYGLUCOSE F - 18 (FDG) INJECTION
7.4600 | Freq: Once | INTRAVENOUS | Status: AC | PRN
  Administered 2018-04-18: 7.46 via INTRAVENOUS

## 2018-04-19 ENCOUNTER — Ambulatory Visit (INDEPENDENT_AMBULATORY_CARE_PROVIDER_SITE_OTHER): Payer: Medicare Other | Admitting: Pulmonary Disease

## 2018-04-19 ENCOUNTER — Encounter: Payer: Self-pay | Admitting: Pulmonary Disease

## 2018-04-19 VITALS — BP 122/70 | HR 71 | Ht 64.0 in | Wt 156.2 lb

## 2018-04-19 DIAGNOSIS — R911 Solitary pulmonary nodule: Secondary | ICD-10-CM | POA: Diagnosis not present

## 2018-04-19 NOTE — Patient Instructions (Signed)
Referral to radiation oncology to discuss possible radiation therapy without biopsy

## 2018-04-19 NOTE — Assessment & Plan Note (Addendum)
We discussed that left upper lobe nodule is hypermetabolic and very likely to be malignancy.  She was very accepting of this possibility.  We discussed CT-guided lung biopsy procedure.  She absolutely does not want invasive procedures such as biopsies.  We discussed the possibility of empiric radiation, she will consider this.  I note that she had radiation to her left breast in 2014.  We will obtain radiation oncology opinion. She would also be accepting of no treatment. Daughter present and agreeable with plan of care.  She has had a good life and going forward her goals would be to avoid painful procedures and have quality of life

## 2018-04-19 NOTE — Progress Notes (Signed)
   Subjective:    Patient ID: Sandra Ferguson, female    DOB: November 20, 1926, 84 y.o.   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 Pierre Bali , then adjuvant chemo  H/o  Invasive carcinoma of the left breast  08/2012 s/p lumpectomy + RT, did not tolerate arimidex  We discussed PET scan results today which shows hypermetabolism and small interval growth, no other lesions  Significant tests/ events reviewed   CT chest 01/19/18 >> an ill-defined 1.4 x 1.9 cm irregular nodule in the left upper lobe extending to the pleura.  There was also some nodularity noted along the anterior pleura on the left  PET 0/3403 >> hypermetabolic left upper lobe nodule   Review of Systems Patient denies significant dyspnea,cough, hemoptysis,  chest pain, palpitations, pedal edema, orthopnea, paroxysmal nocturnal dyspnea, lightheadedness, nausea, vomiting, abdominal or  leg pains      Objective:   Physical Exam  Gen. Pleasant, well-nourished,elderly, in no distress ENT - no thrush, no post nasal drip Neck: No JVD, no thyromegaly, no carotid bruits Lungs: no use of accessory muscles, no dullness to percussion, clear without rales or rhonchi  Cardiovascular: Rhythm regular, heart sounds  normal, no murmurs or gallops, no peripheral edema Musculoskeletal: No deformities, no cyanosis or clubbing  Ambulates with a walker       Assessment & Plan:

## 2018-04-23 ENCOUNTER — Encounter: Payer: Self-pay | Admitting: Radiation Oncology

## 2018-04-25 NOTE — Progress Notes (Signed)
Thoracic Location of Tumor / Histology:  Left upper lobe.   Patient presented months ago with symptoms of: She was being followed for a left upper lobe nodule by Dr. Alva. She had a CT chest on 01/19/18 that showed an ill-defined 1.4 x 1.9 cm irregular nodule in the left upper lobe extending to the pleura. There was also some nodularity noted along the anterior pleura on the left. She then had a PET 04/2018 which showed a hypermetabolic left upper lobe nodule.  Biopsies not completed, patient declined.   Tobacco/Marijuana/Snuff/ETOH use: She is a former smoker, quitting in 1983. She is still using smokeless tobacco.   Past/Anticipated interventions by cardiothoracic surgery, if any: N/A  Dr. Alva with pulmonary saw her 04/19/18 and documents: We discussed that left upper lobe nodule is hypermetabolic and very likely to be malignancy.  She was very accepting of this possibility.  We discussed CT-guided lung biopsy procedure.  She absolutely does not want invasive procedures such as biopsies.  We discussed the possibility of empiric radiation, she will consider this.  I note that she had radiation to her left breast in 2014.  We will obtain radiation oncology opinion. She would also be accepting of no treatment. Daughter present and agreeable with plan of care.  She has had a good life and going forward her goals would be to avoid painful procedures and have quality of life  Past/Anticipated interventions by medical oncology, if any: N/A  Signs/Symptoms  Weight changes, if any: She denies.   Respiratory complaints, if any: She currently has "congestion in her chest". She has a productive cough of scant amount of white sputum.   Hemoptysis, if any: She denies.   Pain issues, if any:  She has chronic back pain.   SAFETY ISSUES:  Prior radiation? Yes,  Radiation treatment dates: 11/27/2012-12/25/2012  Site/dose:  1) Left Breast / 40.05Gy in 15 fractions  2) Left Breast Boost / 10 Gy in 5  fractions    Pacemaker/ICD? No  Possible current pregnancy? No  Is the patient on methotrexate? No  Current Complaints / other details:   04/18/18 PET   Dr. Feng's note from  1. Left Breast cancer, [id number], stage IA, ER100%, PR 8%+, HER2- , diagnosed in 08/2012 -I previously reviewed her history of early stage breast cancer. She is likely cured by complete surgical resection. -She did try Anastrozole, but could not tolerate and stopped it. -She is clinically doing very well, her labs were reviewed with her, they are WNL. Her breast exam was unremarkable, last mammogram was in May 2018. There is no clinical concern for recurrence -We'll continue breast cancer surveillance. I encouraged her to continue annual screening mammogram, she is due in May 2019, I'll order one for her. She can have a screening mammogram  -She is 5 years since her diagnosis, I am OK to discharge her. However she is not seeing her PCP and she would prefer to f/u with me.  -will continue annual F/u  2. SCC of RUL in 2008 -She received 4 cycles of adjuvant carboplatin VP-16 in combination with Neulasta from 10/03/2006 through 12/05/2006. Now NED. No need for routine surveillance scan.  BP (!) 144/64 (BP Location: Left Arm)   Pulse 84   Temp 98.2 F (36.8 C) (Oral)   Ht 5' 4" (1.626 m)   Wt 153 lb 9.6 oz (69.7 kg)   SpO2 95%   BMI 26.37 kg/m    Wt Readings from Last 3 Encounters:  05/01/18   153 lb 9.6 oz (69.7 kg)  04/19/18 156 lb 3.2 oz (70.9 kg)  02/21/18 154 lb 9.6 oz (70.1 kg)

## 2018-04-26 ENCOUNTER — Ambulatory Visit: Payer: Medicare Other | Admitting: Pulmonary Disease

## 2018-05-01 ENCOUNTER — Ambulatory Visit
Admission: RE | Admit: 2018-05-01 | Discharge: 2018-05-01 | Disposition: A | Discharge status: Home / Self Care | Payer: Medicare Other | Source: Ambulatory Visit | Attending: Radiation Oncology | Admitting: Radiation Oncology

## 2018-05-01 ENCOUNTER — Encounter: Payer: Self-pay | Admitting: Radiation Oncology

## 2018-05-01 ENCOUNTER — Other Ambulatory Visit: Payer: Self-pay

## 2018-05-01 VITALS — BP 144/64 | HR 84 | Temp 98.2°F | Ht 64.0 in | Wt 153.6 lb

## 2018-05-01 DIAGNOSIS — Z853 Personal history of malignant neoplasm of breast: Secondary | ICD-10-CM | POA: Insufficient documentation

## 2018-05-01 DIAGNOSIS — M549 Dorsalgia, unspecified: Secondary | ICD-10-CM | POA: Insufficient documentation

## 2018-05-01 DIAGNOSIS — Z87891 Personal history of nicotine dependence: Secondary | ICD-10-CM | POA: Insufficient documentation

## 2018-05-01 DIAGNOSIS — Z7982 Long term (current) use of aspirin: Secondary | ICD-10-CM | POA: Diagnosis not present

## 2018-05-01 DIAGNOSIS — C3412 Malignant neoplasm of upper lobe, left bronchus or lung: Secondary | ICD-10-CM | POA: Insufficient documentation

## 2018-05-01 DIAGNOSIS — G8929 Other chronic pain: Secondary | ICD-10-CM | POA: Diagnosis not present

## 2018-05-01 DIAGNOSIS — Z885 Allergy status to narcotic agent status: Secondary | ICD-10-CM | POA: Insufficient documentation

## 2018-05-01 DIAGNOSIS — Z79899 Other long term (current) drug therapy: Secondary | ICD-10-CM | POA: Diagnosis not present

## 2018-05-01 DIAGNOSIS — Z9221 Personal history of antineoplastic chemotherapy: Secondary | ICD-10-CM | POA: Diagnosis not present

## 2018-05-01 NOTE — Progress Notes (Addendum)
Radiation Oncology         [phone] ________________________________  Outpatient reconsultation  Name: Sandra Ferguson MRN: [id number]  Date: 05/01/2018  DOB: 10-27-1926  YC:[id number], Mauro Kaufmann, MD  Rigoberto Noel, MD   REFERRING PHYSICIAN: Rigoberto Noel, MD  DIAGNOSIS:    ICD-10-CM   1. Cancer of upper lobe of left lung (HCC) C34.12   2. Malignant neoplasm of upper lobe of left lung (HCC) C34.12    Cancer Staging Malignant neoplasm of upper lobe of left lung (HCC) Staging form: Lung, AJCC 8th Edition - Clinical: Stage IA3 (cT1c, cN0, cM0) - Unsigned  CHIEF COMPLAINT: Here to discuss management of suspected lung cancer  HISTORY OF PRESENT ILLNESS::Sandra Ferguson is an 84 y.o. female with a history of SCC of RUL in 2008, treated with lobectomy and adjuvant chemotherapy, now NED. She also has a history of left breast cancer, diagnosed in January 2014 and treated with lumpectomy and adjuvant radiation therapy. She did not tolerate Anastrozole. She is currently on breast cancer surveillance with annual screening mammograms and follow-up with medical oncology.   The patient has been followed by Dr. Elsworth Soho for a left upper lobe nodule. She had a chest CT on 01/19/18 that showed an ill-defined 1.4 x 1.9 cm irregular nodule in the left upper lobe with prominent tethering to the adjacent pleura. There was also subtle nodularity of the anterior pleura along the left upper lobe. Subtle nodularity of the anterior pleura along the left upper lobe may reflect lymphangitic carcinomatosis. She then had a PET on 04/18/18 which showed a hypermetabolic irregular nodule in the left upper lobe abutting the pleural surface. SUV max 15.4. No evidence of metastatic disease. There was postsurgical change in the left breast without evidence of local recurrence. I reviewed her images  Biopsies not completed because patient declined. She is adamant about not wanting to undergo any invasive procedures.  The  patient reviewed the imaging results and treatment options with Dr. Elsworth Soho and has been referred today for discussion of potential radiation treatment options. She is accompanied by her daughter.   The patient is a former smoker; reports she started at age 57 and quit about 10-15 years ago. She is still using smokeless tobacco.  On review of systems, the patient reports she had a UTI about a week ago that was treated with Macrobid. She reports "congestion" in her chest, a productive cough with scant white sputum, and fatigue since starting on Macrobid. She reports chronic back pain.  PREVIOUS RADIATION THERAPY: Yes  Radiation treatment dates:11/27/2012-12/25/2012  Site/dose: 1) Left Breast / 40.05 Gy in 15 fractions  2) Left Breast Boost / 10 Gy in 5 fractions   PAST MEDICAL HISTORY:  has a past medical history of Arthritis, Asthma, Breast cancer ([place]), Breast cancer, left breast ([place]), Diverticulosis, DJD (degenerative joint disease), Full dentures, Gastritis, Interstitial cystitis, Lung cancer ([place]) (2008), Macular degeneration (2011), Osteoporosis, Phlebitis (1970), S/P radiation therapy (11/27/2012-12/25/2012), Shortness of breath, Status post chemotherapy (10/03/2006 - 12/05/2006), and Wears glasses.    PAST SURGICAL HISTORY: Past Surgical History:  Procedure Laterality Date  . BREAST LUMPECTOMY WITH NEEDLE LOCALIZATION AND AXILLARY SENTINEL LYMPH NODE BX  08/21/2012   Procedure: BREAST LUMPECTOMY WITH NEEDLE LOCALIZATION AND AXILLARY SENTINEL LYMPH NODE BX;  Surgeon: Stark Klein, MD;  Location: [facility];  Service: General;  Laterality: Left;  . BREAST SURGERY    . CHOLECYSTECTOMY     laparoscopic  . EYE SURGERY  rt cataract  . right upper lobectomy  09/01/2006    FAMILY HISTORY: family history includes Breast cancer in her maternal grandmother, mother, sister, and sister; Cancer in her maternal grandfather.  SOCIAL HISTORY:  reports that she quit smoking about 36 years  ago. Her smokeless tobacco use includes snuff. She reports that she does not drink alcohol or use drugs. Widowed. 4 children.  ALLERGIES: Morphine sulfate  MEDICATIONS:  Current Outpatient Medications  Medication Sig Dispense Refill  . ALPRAZolam (XANAX) 0.5 MG tablet Take 0.5 mg by mouth 2 (two) times daily as needed. 1/2 to 1 tab po bid for anxiety     . aspirin 81 MG tablet Take 81 mg by mouth daily.    [name] CALCIUM-VITAMIN D PO Take 1 tablet by mouth daily. 1000 mg     No current facility-administered medications for this encounter.     REVIEW OF SYSTEMS:  A 10+ POINT REVIEW OF SYSTEMS WAS OBTAINED including neurology, dermatology, psychiatry, cardiac, respiratory, lymph, extremities, GI, GU, Musculoskeletal, constitutional, breasts, reproductive, HEENT.  All pertinent positives are noted in the HPI.  All others are negative.   PHYSICAL EXAM:  height is 5\' 4"  (1.626 m) and weight is 153 lb 9.6 oz (69.7 kg). Her oral temperature is 98.2 F (36.8 C). Her blood pressure is 144/64 (abnormal) and her pulse is 84. Her oxygen saturation is 95%.   General: Alert and oriented, in no acute distress. HEENT: Head is normocephalic. Extraocular movements are intact. She has decreased hearing on the left. Dentures removed for oral exam. Oral cavity is clear.  Neck: Neck is supple, no palpable cervical or supraclavicular lymphadenopathy. Heart: Regular in rate and rhythm with no murmurs, rubs, or gallops. Chest: Clear to auscultation bilaterally, with no rhonchi, wheezes, or rales. Abdomen: Soft, nontender, nondistended, with no rigidity or guarding. Extremities: No cyanosis or edema. Lymphatics: see Neck Exam Skin: No concerning lesions. Musculoskeletal: Symmetric strength and muscle tone throughout. She is able to move all extremities, but she is in a wheelchair today. Neurologic: Cranial nerves II through XII are grossly intact. No obvious focalities. Speech is fluent.  Psychiatric: Judgment and  insight are intact. Affect is appropriate.  ECOG = 3  0 - Asymptomatic (Fully active, able to carry on all predisease activities without restriction)  1 - Symptomatic but completely ambulatory (Restricted in physically strenuous activity but ambulatory and able to carry out work of a light or sedentary nature. For example, light housework, office work)  2 - Symptomatic, <50% in bed during the day (Ambulatory and capable of all self care but unable to carry out any work activities. Up and about more than 50% of waking hours)  3 - Symptomatic, >50% in bed, but not bedbound (Capable of only limited self-care, confined to bed or chair 50% or more of waking hours)  4 - Bedbound (Completely disabled. Cannot carry on any self-care. Totally confined to bed or chair)  5 - Death   Eustace Pen MM, Creech RH, Tormey DC, et al. [phone]). "Toxicity and response criteria of the [facility]". [place] Oncol. 5 (6): 649-55   LABORATORY DATA:  Lab Results  Component Value Date   WBC 5.6 10/11/2017   HGB 12.6 10/11/2017   HCT 36.6 10/11/2017   MCV 91.7 10/11/2017   PLT 185 10/11/2017   CMP     Component Value Date/Time   NA 136 10/11/2017 1448   NA 137 09/06/2016 1412   K 4.5 10/11/2017 1448  K 4.6 09/06/2016 1412   CL 100 10/11/2017 1448   CL 95 (L) 12/31/2012 1327   CO2 29 10/11/2017 1448   CO2 29 09/06/2016 1412   GLUCOSE 107 10/11/2017 1448   GLUCOSE 101 09/06/2016 1412   GLUCOSE 138 (H) 12/31/2012 1327   BUN 12 10/11/2017 1448   BUN 13.7 09/06/2016 1412   CREATININE 0.74 10/11/2017 1448   CREATININE 0.7 09/06/2016 1412   CALCIUM 9.9 10/11/2017 1448   CALCIUM 10.4 09/06/2016 1412   PROT 6.5 10/11/2017 1448   PROT 6.9 09/06/2016 1412   ALBUMIN 3.8 10/11/2017 1448   ALBUMIN 3.9 09/06/2016 1412   AST 18 10/11/2017 1448   AST 19 09/06/2016 1412   ALT 10 10/11/2017 1448   ALT 11 09/06/2016 1412   ALKPHOS 48 10/11/2017 1448   ALKPHOS 59 09/06/2016 1412    BILITOT 0.3 10/11/2017 1448   BILITOT 0.32 09/06/2016 1412   GFRNONAA >60 10/11/2017 1448   GFRAA >60 10/11/2017 1448         RADIOGRAPHY: Nm Pet Image Initial (pi) Skull Base To Thigh  Result Date: 04/18/2018 CLINICAL DATA:  Initial treatment strategy for pulmonary nodule. History of LEFT breast cancer and RIGHT lung cancer. EXAM: NUCLEAR MEDICINE PET SKULL BASE TO THIGH TECHNIQUE: 7.5 mCi F-18 FDG was injected intravenously. Full-ring PET imaging was performed from the skull base to thigh after the radiotracer. CT data was obtained and used for attenuation correction and anatomic localization. Fasting blood glucose: 91 mg/dl COMPARISON:  CT 01/19/2018 FINDINGS: Mediastinal blood pool activity: SUV max 1.8 NECK: No hypermetabolic lymph nodes in the neck. Incidental CT findings: none CHEST: Elongated nodule with spiculated margins measures 2.4 x 1.5 cm in the LEFT upper lobe. Lesion abuts the pleural surface and is intensely hypermetabolic with SUV max equal 15.4. No additional suspicious pulmonary nodules. Post surgery change in the LEFT breast without focal metabolic activity. No hypermetabolic mediastinal lymph nodes. Incidental CT findings: none ABDOMEN/PELVIS: No abnormal metabolic activity liver. Adrenal glands are normal. Normal pancreas and spleen. No hypermetabolic upper abdominal or pelvic lymph nodes. Incidental CT findings: Atherosclerotic calcification of the aorta. SKELETON: No focal hypermetabolic activity to suggest skeletal metastasis. Incidental CT findings: none IMPRESSION: 1. Hypermetabolic irregular nodule in the LEFT upper lobe abutting the pleural surface is concerning for primary lung cancer. 2. No evidence of metastatic disease. 3. Postsurgical change in the LEFT breast without evidence of local recurrence by PET-CT imaging. Electronically Signed   By: Suzy Bouchard M.D.   On: 04/18/2018 17:08      IMPRESSION/PLAN:  Left lung nodule highly suspicious for primary lung cancer,  patient declined biopsy.  She is not interested in any invasive measures given her age and health (biopsy, surgery, etc). She is interested in radiation therapy.  We also discussed observation, but she prefers to treat this to prevent further progression (which is likely without treatment).  It was a pleasure seeing the patient today. We discussed the risks, benefits, and side effects of radiotherapy, including risk of rib fracture. No guarantees of treatment were given. A consent form was signed and placed in the patient's medical record. The patient is enthusiastic about proceeding with treatment. I look forward to participating in the patient's care. I anticipate 5-10 radiation treatments over the course of 2 weeks. She will undergo CT simulation in the near future with treatment to begin approximately one week later. I will order PFTs prior to this.  She did have previous RT to the left breast  and we will consider this during the RT planning process.  I asked the patient today about tobacco use. The patient uses smokeless tobacco.  I advised the patient to quit. I assessed for the willingness to attempt to quit and provided encouragement to try Nicorette gum. Over 3 minutes were spent on this issue.  I spent 45 minutes  face to face with the patient and more than 50% of that time was spent in counseling and/or coordination of care. __________________________________________   Eppie Gibson, MD  This document serves as a record of services personally performed by Eppie Gibson, MD. It was created on her behalf by Rae Lips, a trained medical scribe. The creation of this record is based on the scribe's personal observations and the provider's statements to them. This document has been checked and approved by the attending provider.

## 2018-05-02 ENCOUNTER — Ambulatory Visit
Admission: RE | Admit: 2018-05-02 | Discharge: 2018-05-02 | Disposition: A | Discharge status: Home / Self Care | Payer: Medicare Other | Source: Ambulatory Visit | Attending: Radiation Oncology | Admitting: Radiation Oncology

## 2018-05-02 ENCOUNTER — Ambulatory Visit: Payer: Medicare Other | Admitting: Pulmonary Disease

## 2018-05-02 ENCOUNTER — Other Ambulatory Visit: Payer: Self-pay | Admitting: Radiation Oncology

## 2018-05-02 ENCOUNTER — Encounter: Payer: Self-pay | Admitting: Radiation Oncology

## 2018-05-02 DIAGNOSIS — C3412 Malignant neoplasm of upper lobe, left bronchus or lung: Secondary | ICD-10-CM | POA: Insufficient documentation

## 2018-05-02 NOTE — Progress Notes (Signed)
  Radiation Oncology         [phone] ________________________________  Name: Sandra Ferguson MRN: [id number]  Date: 05/02/2018  DOB: [birth year]  4DCT COMPLEX SIMULATION / TREATMENT PLANNING NOTE / SPECIAL TREATMENT PROCEDURE  Outpatient    ICD-10-CM   1. Malignant neoplasm of upper lobe of left lung ([place]) C34.12    The patient was positioned on the CT simulator in a complex treatment device custom fitted to their body: A body fix blue bag. The patient's head was in an Accuform.  The patient's arms were over their head. An abdominal compression device was snugly fitted to decrease the patient's intrathoracic movements.  RESPIRATORY MOTION MANAGEMENT SIMULATION  NARRATIVE:  In order to account for effect of respiratory motion on target structures and other organs in the planning and delivery of radiotherapy, this patient underwent respiratory motion management simulation.  To accomplish this, when the patient was brought to the CT simulation planning suite, 4D respiratory motion management CT images were obtained.  The CT images were loaded into the planning software.  Then, using a variety of tools including Cine, MIP, and standard views, the target volume and planning target volumes (PTV) were delineated.  Avoidance structures were contoured.  Treatment planning then occurred.  Dose volume histograms will be generated and reviewed for each of the requested structure.  I contoured the patient's ITV and increased ITV with tight margins for the PTV. I will prescribe 50 Gy in 10 fractions of 5 Gy per fraction every other day. I requested a DVH of the patient's lungs, target volumes, esophagus, heart, spinal cord and airways for 3D conformal planning. Cone beam CT scans will be performed prior to each fraction to allow close PTV margins and sparing of normal tissues from high doses. VMAT technique will be used for treatment.  Special Treatment Procedure Note: The patient received prior  radiotherapy within her current fields. There will be some overlap of radiation dose.  Prior regional radiotherapy increases the risk of side effects from treatment. I have considered this in the treatment planning process and have aimed to minimize tissue overlap.  This increases the complexity of this patient's treatment and therefore this constitutes a special treatment procedure.  -----------------------------------  Eppie Gibson, MD

## 2018-05-04 ENCOUNTER — Encounter (HOSPITAL_COMMUNITY): Payer: Medicare Other

## 2018-05-04 ENCOUNTER — Ambulatory Visit (HOSPITAL_COMMUNITY)
Admission: RE | Admit: 2018-05-04 | Discharge: 2018-05-04 | Disposition: A | Discharge status: Home / Self Care | Payer: Medicare Other | Source: Ambulatory Visit | Attending: Radiation Oncology | Admitting: Radiation Oncology

## 2018-05-04 DIAGNOSIS — J449 Chronic obstructive pulmonary disease, unspecified: Secondary | ICD-10-CM | POA: Diagnosis not present

## 2018-05-04 DIAGNOSIS — F17210 Nicotine dependence, cigarettes, uncomplicated: Secondary | ICD-10-CM | POA: Diagnosis not present

## 2018-05-04 DIAGNOSIS — C3412 Malignant neoplasm of upper lobe, left bronchus or lung: Secondary | ICD-10-CM | POA: Insufficient documentation

## 2018-05-04 DIAGNOSIS — R918 Other nonspecific abnormal finding of lung field: Secondary | ICD-10-CM | POA: Diagnosis not present

## 2018-05-04 LAB — PULMONARY FUNCTION TEST
DL/VA % PRED: 86 %
DL/VA: 4.04 ml/min/mmHg/L
DLCO UNC % PRED: 50 %
DLCO UNC: 11.5 ml/min/mmHg
FEF 25-75 POST: 1.3 L/s
FEF 25-75 PRE: 0.78 L/s
FEF2575-%Change-Post: 65 %
FEF2575-%PRED-POST: 154 %
FEF2575-%PRED-PRE: 93 %
FEV1-%Change-Post: 14 %
FEV1-%Pred-Post: 99 %
FEV1-%Pred-Pre: 86 %
FEV1-PRE: 1.29 L
FEV1-Post: 1.48 L
FEV1FVC-%CHANGE-POST: 8 %
FEV1FVC-%Pred-Pre: 95 %
FEV6-%Change-Post: 7 %
FEV6-%PRED-PRE: 96 %
FEV6-%Pred-Post: 103 %
FEV6-POST: 1.97 L
FEV6-Pre: 1.84 L
FEV6FVC-%Change-Post: 2 %
FEV6FVC-%PRED-POST: 107 %
FEV6FVC-%Pred-Pre: 105 %
FVC-%Change-Post: 5 %
FVC-%PRED-POST: 97 %
FVC-%PRED-PRE: 91 %
FVC-PRE: 1.88 L
FVC-Post: 1.98 L
POST FEV6/FVC RATIO: 100 %
PRE FEV1/FVC RATIO: 69 %
PRE FEV6/FVC RATIO: 98 %
Post FEV1/FVC ratio: 75 %
RV % pred: 78 %
RV: 1.99 L
TLC % pred: 79 %
TLC: 3.9 L

## 2018-05-04 MED ORDER — ALBUTEROL SULFATE (2.5 MG/3ML) 0.083% IN NEBU
2.5000 mg | INHALATION_SOLUTION | Freq: Once | RESPIRATORY_TRACT | Status: AC
  Administered 2018-05-04: 2.5 mg via RESPIRATORY_TRACT

## 2018-05-07 DIAGNOSIS — C3412 Malignant neoplasm of upper lobe, left bronchus or lung: Secondary | ICD-10-CM | POA: Diagnosis not present

## 2018-05-08 ENCOUNTER — Telehealth: Payer: Self-pay

## 2018-05-08 NOTE — Telephone Encounter (Signed)
I received a phone call from Ms. Bona's daughter today. She is scheduled to start radiation treatments tomorrow, but her daughter reports that she has a bad chest cold and is feeling weak. She asked if they should wait to start radiation. I informed Dr. Isidore Moos of the above and she agrees that it is best to delay the start of radiation until she is feeling better. I called Ms. Maldau's daughter back and relayed the above on a voice mail. I did encourage her to contact her mom's PCP to see if she should be seen regarding her illness. I informed her that I would check on her tomorrow and also left my direct contact number if needed. I have informed [facility] 1 of the delay in treatment and reason.

## 2018-05-09 ENCOUNTER — Ambulatory Visit: Payer: Medicare Other | Admitting: Radiation Oncology

## 2018-05-10 ENCOUNTER — Telehealth: Payer: Self-pay

## 2018-05-10 ENCOUNTER — Ambulatory Visit: Payer: Medicare Other | Admitting: Radiation Oncology

## 2018-05-10 NOTE — Telephone Encounter (Signed)
I spoke with Ms. Maddalena daughter, Loreta Ave. I inquired about how her mother was feeling and when she thought she could begin her radiation treatments. Ms. Stefan Church reported that her mother was still feeling poorly. She is hoping to get her to see a physician as soon as possible. Ms. Stefan Church discussed with me that her mother has also indicated to her that she does not want to receive her radiation treatments at all. Ms. Stefan Church has discussed with her siblings and they would like to respect their mothers wishes at this time. Ms. Stefan Church cancelled her mothers treatments. I have notified Dr. Isidore Moos and the radiation treatment machine.

## 2018-05-11 ENCOUNTER — Ambulatory Visit: Payer: Medicare Other | Admitting: Radiation Oncology

## 2018-05-11 ENCOUNTER — Encounter: Payer: Self-pay | Admitting: Radiation Oncology

## 2018-05-11 NOTE — Progress Notes (Signed)
I left a voicemail message for the patient's daughter, Sandra Ferguson, regarding her mother's decision to cancel radiation treatments.  I told her daughter to call us if her mom changes her mind.  I also advised her to make sure that the patient's PCP evaluates her if she is feeling poorly.  I explained I do not think that the nodule in her lung is responsible for her peeling feeling poorly and I would not want her to jump to conclusions that this is related to cancer.  In case she has an infection or another condition that is lending towards her symptoms, this could be reversed with proper medical attention.  She understands that all treatments are called off for now in our clinic per their request, unless we hear that the patient has changed her mind.   -----------------------------------  Eppie Gibson, MD

## 2018-05-14 ENCOUNTER — Ambulatory Visit: Payer: Medicare Other | Admitting: Radiation Oncology

## 2018-05-15 ENCOUNTER — Ambulatory Visit: Payer: Medicare Other | Admitting: Radiation Oncology

## 2018-05-16 ENCOUNTER — Ambulatory Visit: Payer: Medicare Other | Admitting: Radiation Oncology

## 2018-05-17 ENCOUNTER — Ambulatory Visit: Payer: Medicare Other | Admitting: Radiation Oncology

## 2018-05-18 ENCOUNTER — Ambulatory Visit: Payer: Medicare Other | Admitting: Radiation Oncology

## 2018-05-21 ENCOUNTER — Ambulatory Visit: Payer: Medicare Other | Admitting: Radiation Oncology

## 2018-05-22 ENCOUNTER — Ambulatory Visit: Payer: Medicare Other | Admitting: Radiation Oncology

## 2018-05-23 ENCOUNTER — Ambulatory Visit: Payer: Medicare Other | Admitting: Radiation Oncology

## 2018-05-24 ENCOUNTER — Ambulatory Visit: Payer: Medicare Other | Admitting: Radiation Oncology

## 2018-05-25 ENCOUNTER — Ambulatory Visit: Payer: Medicare Other | Admitting: Radiation Oncology

## 2018-05-28 ENCOUNTER — Ambulatory Visit: Payer: Medicare Other | Admitting: Radiation Oncology

## 2018-05-29 ENCOUNTER — Ambulatory Visit: Payer: Medicare Other | Admitting: Radiation Oncology

## 2018-05-30 ENCOUNTER — Ambulatory Visit: Payer: Medicare Other | Admitting: Radiation Oncology

## 2018-06-06 ENCOUNTER — Encounter (INDEPENDENT_AMBULATORY_CARE_PROVIDER_SITE_OTHER): Payer: Medicare Other | Admitting: Ophthalmology

## 2018-06-06 DIAGNOSIS — H43813 Vitreous degeneration, bilateral: Secondary | ICD-10-CM | POA: Diagnosis not present

## 2018-06-06 DIAGNOSIS — H353231 Exudative age-related macular degeneration, bilateral, with active choroidal neovascularization: Secondary | ICD-10-CM

## 2018-07-16 ENCOUNTER — Ambulatory Visit: Payer: Medicare Other | Admitting: Pulmonary Disease

## 2018-08-29 ENCOUNTER — Encounter (INDEPENDENT_AMBULATORY_CARE_PROVIDER_SITE_OTHER): Payer: Medicare Other | Admitting: Ophthalmology

## 2018-08-29 DIAGNOSIS — H43813 Vitreous degeneration, bilateral: Secondary | ICD-10-CM

## 2018-08-29 DIAGNOSIS — H353231 Exudative age-related macular degeneration, bilateral, with active choroidal neovascularization: Secondary | ICD-10-CM

## 2018-10-12 ENCOUNTER — Other Ambulatory Visit: Payer: Self-pay

## 2018-10-12 DIAGNOSIS — C50411 Malignant neoplasm of upper-outer quadrant of right female breast: Secondary | ICD-10-CM

## 2018-10-15 ENCOUNTER — Inpatient Hospital Stay: Payer: Medicare Other

## 2018-10-15 ENCOUNTER — Inpatient Hospital Stay: Payer: Medicare Other | Attending: Hematology | Admitting: Hematology

## 2018-11-29 ENCOUNTER — Encounter (INDEPENDENT_AMBULATORY_CARE_PROVIDER_SITE_OTHER): Payer: Medicare Other | Admitting: Ophthalmology

## 2018-12-26 ENCOUNTER — Encounter (INDEPENDENT_AMBULATORY_CARE_PROVIDER_SITE_OTHER): Payer: Medicare Other | Admitting: Ophthalmology

## 2018-12-26 ENCOUNTER — Other Ambulatory Visit: Payer: Self-pay

## 2018-12-26 DIAGNOSIS — H43813 Vitreous degeneration, bilateral: Secondary | ICD-10-CM

## 2018-12-26 DIAGNOSIS — H353231 Exudative age-related macular degeneration, bilateral, with active choroidal neovascularization: Secondary | ICD-10-CM

## 2019-03-20 ENCOUNTER — Other Ambulatory Visit: Payer: Self-pay

## 2019-03-20 ENCOUNTER — Encounter (INDEPENDENT_AMBULATORY_CARE_PROVIDER_SITE_OTHER): Payer: Medicare Other | Admitting: Ophthalmology

## 2019-03-20 DIAGNOSIS — H43813 Vitreous degeneration, bilateral: Secondary | ICD-10-CM

## 2019-03-20 DIAGNOSIS — H353231 Exudative age-related macular degeneration, bilateral, with active choroidal neovascularization: Secondary | ICD-10-CM | POA: Diagnosis not present

## 2019-06-03 ENCOUNTER — Encounter (HOSPITAL_COMMUNITY): Payer: Self-pay

## 2019-06-03 ENCOUNTER — Inpatient Hospital Stay (HOSPITAL_COMMUNITY)
Admission: EM | Admit: 2019-06-03 | Discharge: 2019-06-06 | DRG: 292 | Disposition: A | Discharge status: Hospice | Payer: Medicare Other | Attending: Internal Medicine | Admitting: Internal Medicine

## 2019-06-03 ENCOUNTER — Emergency Department (HOSPITAL_COMMUNITY): Payer: Medicare Other

## 2019-06-03 DIAGNOSIS — K5900 Constipation, unspecified: Secondary | ICD-10-CM | POA: Diagnosis present

## 2019-06-03 DIAGNOSIS — Z7982 Long term (current) use of aspirin: Secondary | ICD-10-CM

## 2019-06-03 DIAGNOSIS — I509 Heart failure, unspecified: Secondary | ICD-10-CM

## 2019-06-03 DIAGNOSIS — I2510 Atherosclerotic heart disease of native coronary artery without angina pectoris: Secondary | ICD-10-CM | POA: Diagnosis present

## 2019-06-03 DIAGNOSIS — N390 Urinary tract infection, site not specified: Secondary | ICD-10-CM | POA: Diagnosis present

## 2019-06-03 DIAGNOSIS — Z87891 Personal history of nicotine dependence: Secondary | ICD-10-CM

## 2019-06-03 DIAGNOSIS — I5041 Acute combined systolic (congestive) and diastolic (congestive) heart failure: Principal | ICD-10-CM | POA: Diagnosis present

## 2019-06-03 DIAGNOSIS — I5020 Unspecified systolic (congestive) heart failure: Secondary | ICD-10-CM

## 2019-06-03 DIAGNOSIS — E871 Hypo-osmolality and hyponatremia: Secondary | ICD-10-CM | POA: Diagnosis present

## 2019-06-03 DIAGNOSIS — Z803 Family history of malignant neoplasm of breast: Secondary | ICD-10-CM

## 2019-06-03 DIAGNOSIS — M810 Age-related osteoporosis without current pathological fracture: Secondary | ICD-10-CM | POA: Diagnosis present

## 2019-06-03 DIAGNOSIS — C3412 Malignant neoplasm of upper lobe, left bronchus or lung: Secondary | ICD-10-CM | POA: Diagnosis present

## 2019-06-03 DIAGNOSIS — Z9221 Personal history of antineoplastic chemotherapy: Secondary | ICD-10-CM

## 2019-06-03 DIAGNOSIS — Z853 Personal history of malignant neoplasm of breast: Secondary | ICD-10-CM

## 2019-06-03 DIAGNOSIS — C3490 Malignant neoplasm of unspecified part of unspecified bronchus or lung: Secondary | ICD-10-CM | POA: Diagnosis present

## 2019-06-03 DIAGNOSIS — R0602 Shortness of breath: Secondary | ICD-10-CM | POA: Diagnosis not present

## 2019-06-03 DIAGNOSIS — Z85118 Personal history of other malignant neoplasm of bronchus and lung: Secondary | ICD-10-CM

## 2019-06-03 DIAGNOSIS — J432 Centrilobular emphysema: Secondary | ICD-10-CM | POA: Diagnosis present

## 2019-06-03 DIAGNOSIS — Z79899 Other long term (current) drug therapy: Secondary | ICD-10-CM

## 2019-06-03 DIAGNOSIS — I429 Cardiomyopathy, unspecified: Secondary | ICD-10-CM | POA: Diagnosis present

## 2019-06-03 DIAGNOSIS — Z923 Personal history of irradiation: Secondary | ICD-10-CM

## 2019-06-03 DIAGNOSIS — I5031 Acute diastolic (congestive) heart failure: Secondary | ICD-10-CM | POA: Diagnosis present

## 2019-06-03 DIAGNOSIS — Z8672 Personal history of thrombophlebitis: Secondary | ICD-10-CM

## 2019-06-03 DIAGNOSIS — Z66 Do not resuscitate: Secondary | ICD-10-CM | POA: Diagnosis present

## 2019-06-03 DIAGNOSIS — Z20828 Contact with and (suspected) exposure to other viral communicable diseases: Secondary | ICD-10-CM | POA: Diagnosis present

## 2019-06-03 DIAGNOSIS — Z515 Encounter for palliative care: Secondary | ICD-10-CM | POA: Diagnosis present

## 2019-06-03 LAB — CBC WITH DIFFERENTIAL/PLATELET
Abs Immature Granulocytes: 0.03 10*3/uL (ref 0.00–0.07)
Basophils Absolute: 0 10*3/uL (ref 0.0–0.1)
Basophils Relative: 0 %
Eosinophils Absolute: 0.1 10*3/uL (ref 0.0–0.5)
Eosinophils Relative: 1 %
HCT: 36.1 % (ref 36.0–46.0)
Hemoglobin: 11.9 g/dL — ABNORMAL LOW (ref 12.0–15.0)
Immature Granulocytes: 1 %
Lymphocytes Relative: 15 %
Lymphs Abs: 0.8 10*3/uL (ref 0.7–4.0)
MCH: 30.6 pg (ref 26.0–34.0)
MCHC: 33 g/dL (ref 30.0–36.0)
MCV: 92.8 fL (ref 80.0–100.0)
Monocytes Absolute: 0.5 10*3/uL (ref 0.1–1.0)
Monocytes Relative: 9 %
Neutro Abs: 3.9 10*3/uL (ref 1.7–7.7)
Neutrophils Relative %: 74 %
Platelets: 219 10*3/uL (ref 150–400)
RBC: 3.89 MIL/uL (ref 3.87–5.11)
RDW: 13.8 % (ref 11.5–15.5)
WBC: 5.3 10*3/uL (ref 4.0–10.5)
nRBC: 0 % (ref 0.0–0.2)

## 2019-06-03 LAB — COMPREHENSIVE METABOLIC PANEL
ALT: 10 U/L (ref 0–44)
AST: 19 U/L (ref 15–41)
Albumin: 3.6 g/dL (ref 3.5–5.0)
Alkaline Phosphatase: 56 U/L (ref 38–126)
Anion gap: 9 (ref 5–15)
BUN: 12 mg/dL (ref 8–23)
CO2: 28 mmol/L (ref 22–32)
Calcium: 8.8 mg/dL — ABNORMAL LOW (ref 8.9–10.3)
Chloride: 98 mmol/L (ref 98–111)
Creatinine, Ser: 0.66 mg/dL (ref 0.44–1.00)
GFR calc Af Amer: >60 mL/min (ref >60)
GFR calc non Af Amer: >60 mL/min (ref >60)
Glucose, Bld: 138 mg/dL — ABNORMAL HIGH (ref 70–99)
Potassium: 3.8 mmol/L (ref 3.5–5.1)
Sodium: 135 mmol/L (ref 135–145)
Total Bilirubin: 0.3 mg/dL (ref 0.3–1.2)
Total Protein: 6.5 g/dL (ref 6.5–8.1)

## 2019-06-03 LAB — SARS CORONAVIRUS 2 (TAT 6-24 HRS): SARS Coronavirus 2: NEGATIVE

## 2019-06-03 LAB — BRAIN NATRIURETIC PEPTIDE: B Natriuretic Peptide: 904.4 pg/mL — ABNORMAL HIGH (ref 0.0–100.0)

## 2019-06-03 MED ORDER — ASPIRIN EC 81 MG PO TBEC: 81.0000 mg | DELAYED_RELEASE_TABLET | Freq: Every day | ORAL | Status: DC | PRN

## 2019-06-03 MED ORDER — FUROSEMIDE 10 MG/ML IJ SOLN
40.0000 mg | Freq: Once | INTRAMUSCULAR | Status: AC
  Administered 2019-06-03: 40 mg via INTRAVENOUS
  Filled 2019-06-03: qty 4

## 2019-06-03 MED ORDER — ALPRAZOLAM 0.25 MG PO TABS: 0.2500 mg | ORAL_TABLET | Freq: Two times a day (BID) | ORAL | Status: DC | PRN

## 2019-06-03 MED ORDER — IOHEXOL 350 MG/ML SOLN
100.0000 mL | Freq: Once | INTRAVENOUS | Status: AC | PRN
  Administered 2019-06-03: 100 mL via INTRAVENOUS

## 2019-06-03 MED ORDER — SODIUM CHLORIDE (PF) 0.9 % IJ SOLN
INTRAMUSCULAR | Status: AC
  Administered 2019-06-04: 3 mL via INTRAVENOUS
  Filled 2019-06-03: qty 50

## 2019-06-03 MED ORDER — ALPRAZOLAM 0.5 MG PO TABS
0.5000 mg | ORAL_TABLET | Freq: Once | ORAL | Status: AC
  Administered 2019-06-03: 0.5 mg via ORAL
  Filled 2019-06-03: qty 1

## 2019-06-03 NOTE — ED Provider Notes (Signed)
[facility] DEPT Provider Note   CSN: [id number] Arrival date & time: 06/03/19  1520     History   Chief Complaint No chief complaint on file.   HPI Sandra Ferguson is a 85 y.o. female.     Patient is a 85 year old female with a history of small cell lung cancer, breast cancer, asthma who is is not currently getting any chemotherapy and is presenting today with shortness of breath.  For the last 5 days she has had worsening shortness of breath that is most pronounced on exertion.  She is also had mild cough and congestion but denies any productive cough, chest pain or fever.  She has not noticed any swelling in sitting and resting seems to make the shortness of breath better.  Hospice was coming out today for the first time for evaluation but due to her significant symptoms they recommended she come to the hospital for further evaluation.  The history is provided by the patient.    Past Medical History:  Diagnosis Date   Arthritis    Asthma    very little   Breast cancer ([place])    Breast cancer, left breast (HCC)    Invasive Carcinoma; 0/2 Nodes Negative; Er100%, PR 8 % K1-67 13%, Her-2New No amplification   Diverticulosis    DJD (degenerative joint disease)    Full dentures    Gastritis    Interstitial cystitis    Lung cancer ([place]) 2008   Small Cell LUng Cancer Right Upper Lobe   Macular degeneration 2011   Osteoporosis    Phlebitis 1970   S/P radiation therapy 11/27/2012-12/25/2012   Left Breast / 40.05Gy in 15 fractions/Left Breast Boost / 10 Gy in 5 fractions   Shortness of breath    Status post chemotherapy 10/03/2006 - 12/05/2006   5 Cycles of Adjuvant Carboplatin/ VP-16 with Neulasta Support    Wears glasses     Patient Active Problem List   Diagnosis Date Noted   Malignant neoplasm of upper lobe of left lung ([place]) 05/02/2018   Left upper lobe pulmonary nodule 02/21/2018   Centrilobular emphysema ([place]) 02/21/2018     Malignant neoplasm of upper-outer quadrant of female breast ([place]) 03/07/2014   Urinary frequency 12/31/2012   Osteoporosis 09/05/2012   History of left breast cancer 08/15/2012   Small cell lung cancer ([place]) 08/25/2011    Past Surgical History:  Procedure Laterality Date   BREAST LUMPECTOMY WITH NEEDLE LOCALIZATION AND AXILLARY SENTINEL LYMPH NODE BX  08/21/2012   Procedure: BREAST LUMPECTOMY WITH NEEDLE LOCALIZATION AND AXILLARY SENTINEL LYMPH NODE BX;  Surgeon: Stark Klein, MD;  Location: [facility];  Service: General;  Laterality: Left;   BREAST SURGERY     CHOLECYSTECTOMY     laparoscopic   EYE SURGERY     rt cataract   right upper lobectomy  09/01/2006     OB History   No obstetric history on file.      Home Medications    Prior to Admission medications   Medication Sig Start Date End Date Taking? Authorizing Provider  ALPRAZolam Duanne Moron) 0.5 MG tablet Take 0.5 mg by mouth 2 (two) times daily as needed. 1/2 to 1 tab po bid for anxiety  10/27/10   [provider]  aspirin 81 MG tablet Take 81 mg by mouth daily.    [provider]  CALCIUM-VITAMIN D PO Take 1 tablet by mouth daily. 1000 mg    [provider]  Family History Family History  Problem Relation Age of Onset   Breast cancer Mother    Breast cancer Sister        90 -sister (paternal side of family)   Cancer Maternal Grandfather    Breast cancer Maternal Grandmother    Breast cancer Sister     Social History Social History   Tobacco Use   Smoking status: Former Smoker    Quit date: 08/16/1981    Years since quitting: 37.8   Smokeless tobacco: Current User    Types: Snuff  Substance Use Topics   Alcohol use: No   Drug use: No     Allergies   Morphine sulfate   Review of Systems Review of Systems  All other systems reviewed and are negative.    Physical Exam Updated Vital Signs BP 134/85 (BP Location: Right Arm)    Pulse 90     Temp 97.6 F (36.4 C) (Oral)    Resp (!) 23    Ht 5\' 6"  (1.676 m)    Wt 65.8 kg    SpO2 100%    BMI 23.40 kg/m   Physical Exam Vitals signs and nursing note reviewed.  Constitutional:      General: She is not in acute distress.    Appearance: She is well-developed and normal weight.  HENT:     Head: Normocephalic and atraumatic.  Eyes:     Conjunctiva/sclera: Conjunctivae normal.     Pupils: Pupils are equal, round, and reactive to light.  Neck:     Musculoskeletal: Normal range of motion and neck supple.  Cardiovascular:     Rate and Rhythm: Regular rhythm. Tachycardia present.     Pulses: Normal pulses.     Heart sounds: No murmur.  Pulmonary:     Effort: Tachypnea and accessory muscle usage present. No respiratory distress.     Breath sounds: Examination of the left-lower field reveals rales. Rales present. No wheezing.  Abdominal:     General: There is no distension.     Palpations: Abdomen is soft.     Tenderness: There is no abdominal tenderness. There is no guarding or rebound.  Musculoskeletal: Normal range of motion.        General: No tenderness.     Right lower leg: No edema.     Left lower leg: No edema.  Skin:    General: Skin is warm and dry.     Findings: No erythema or rash.  Neurological:     General: No focal deficit present.     Mental Status: She is alert and oriented to person, place, and time. Mental status is at baseline.  Psychiatric:        Mood and Affect: Mood normal.        Behavior: Behavior normal.        Thought Content: Thought content normal.      ED Treatments / Results  Labs (all labs ordered are listed, but only abnormal results are displayed) Labs Reviewed  CBC WITH DIFFERENTIAL/PLATELET - Abnormal; Notable for the following components:      Result Value   Hemoglobin 11.9 (*)    All other components within normal limits  COMPREHENSIVE METABOLIC PANEL - Abnormal; Notable for the following components:   Glucose, Bld 138 (*)     Calcium 8.8 (*)    All other components within normal limits  SARS CORONAVIRUS 2 (TAT 6-24 HRS)  BRAIN NATRIURETIC PEPTIDE    EKG EKG Interpretation  Date/Time:  Monday June 03 2019 15:44:59 EST Ventricular Rate:  91 PR Interval:    QRS Duration: 163 QT Interval:  408 QTC Calculation: 502 R Axis:   -32 Text Interpretation: Sinus rhythm new Left bundle branch block Confirmed by Blanchie Dessert [phone]) on 06/03/2019 4:05:32 PM   Radiology No results found.  Procedures Procedures (including critical care time)  Medications Ordered in ED Medications - No data to display   Initial Impression / Assessment and Plan / ED Course  I have reviewed the triage vital signs and the nursing notes.  Pertinent labs & imaging results that were available during my care of the patient were reviewed by me and considered in my medical decision making (see chart for details).        Elderly patient with multiple medical problems with known small cell lung cancer that she chose not to have treated presenting with worsening shortness of breath over the last 4 to 5 days.  Hospice was coming out to evaluate the patient today but given her significant shortness of breath they recommend she come to the hospital.  She has had no productive cough but did have some congestion several days ago.  With any type of movement or exertion patient's respiratory rate significantly increases and she starts pursed lip breathing.  She does have some rales on the left lower lobe and concern for possible pulmonary effusion versus PE versus pneumonia or complications from her known cancer.  Labs and imaging pending.  Patient placed on 3 L of oxygen for comfort.  She does not wear oxygen at home.  5:09 PM Pt checked out to Dr. Roderic Palau at 1700.  Final Clinical Impressions(s) / ED Diagnoses   Final diagnoses:  None    ED Discharge Orders    None       Blanchie Dessert, MD 06/03/19 1709

## 2019-06-03 NOTE — ED Triage Notes (Signed)
Pt from home with hx of lung cancer and dementia.  Hospice care was established today.  Pt' sats decrease to low 90's with any exertion.     BP 134/78 98% 3 L [place] 85 Pulse 97.9 temp

## 2019-06-04 ENCOUNTER — Inpatient Hospital Stay (HOSPITAL_COMMUNITY): Payer: Medicare Other

## 2019-06-04 ENCOUNTER — Other Ambulatory Visit: Payer: Self-pay

## 2019-06-04 DIAGNOSIS — Z515 Encounter for palliative care: Secondary | ICD-10-CM | POA: Diagnosis present

## 2019-06-04 DIAGNOSIS — J432 Centrilobular emphysema: Secondary | ICD-10-CM | POA: Diagnosis present

## 2019-06-04 DIAGNOSIS — Z853 Personal history of malignant neoplasm of breast: Secondary | ICD-10-CM | POA: Diagnosis not present

## 2019-06-04 DIAGNOSIS — I5031 Acute diastolic (congestive) heart failure: Secondary | ICD-10-CM | POA: Diagnosis not present

## 2019-06-04 DIAGNOSIS — Z7982 Long term (current) use of aspirin: Secondary | ICD-10-CM | POA: Diagnosis not present

## 2019-06-04 DIAGNOSIS — I5023 Acute on chronic systolic (congestive) heart failure: Secondary | ICD-10-CM | POA: Diagnosis not present

## 2019-06-04 DIAGNOSIS — C3490 Malignant neoplasm of unspecified part of unspecified bronchus or lung: Secondary | ICD-10-CM

## 2019-06-04 DIAGNOSIS — I340 Nonrheumatic mitral (valve) insufficiency: Secondary | ICD-10-CM | POA: Diagnosis not present

## 2019-06-04 DIAGNOSIS — Z79899 Other long term (current) drug therapy: Secondary | ICD-10-CM | POA: Diagnosis not present

## 2019-06-04 DIAGNOSIS — I5041 Acute combined systolic (congestive) and diastolic (congestive) heart failure: Secondary | ICD-10-CM | POA: Diagnosis present

## 2019-06-04 DIAGNOSIS — R0602 Shortness of breath: Secondary | ICD-10-CM | POA: Diagnosis present

## 2019-06-04 DIAGNOSIS — K5900 Constipation, unspecified: Secondary | ICD-10-CM | POA: Diagnosis present

## 2019-06-04 DIAGNOSIS — E871 Hypo-osmolality and hyponatremia: Secondary | ICD-10-CM | POA: Diagnosis present

## 2019-06-04 DIAGNOSIS — Z9221 Personal history of antineoplastic chemotherapy: Secondary | ICD-10-CM | POA: Diagnosis not present

## 2019-06-04 DIAGNOSIS — Z20828 Contact with and (suspected) exposure to other viral communicable diseases: Secondary | ICD-10-CM | POA: Diagnosis present

## 2019-06-04 DIAGNOSIS — I429 Cardiomyopathy, unspecified: Secondary | ICD-10-CM | POA: Diagnosis present

## 2019-06-04 DIAGNOSIS — I5021 Acute systolic (congestive) heart failure: Secondary | ICD-10-CM | POA: Diagnosis not present

## 2019-06-04 DIAGNOSIS — Z85118 Personal history of other malignant neoplasm of bronchus and lung: Secondary | ICD-10-CM | POA: Diagnosis not present

## 2019-06-04 DIAGNOSIS — M810 Age-related osteoporosis without current pathological fracture: Secondary | ICD-10-CM | POA: Diagnosis present

## 2019-06-04 DIAGNOSIS — Z8672 Personal history of thrombophlebitis: Secondary | ICD-10-CM | POA: Diagnosis not present

## 2019-06-04 DIAGNOSIS — Z923 Personal history of irradiation: Secondary | ICD-10-CM | POA: Diagnosis not present

## 2019-06-04 DIAGNOSIS — Z66 Do not resuscitate: Secondary | ICD-10-CM | POA: Diagnosis present

## 2019-06-04 DIAGNOSIS — I2510 Atherosclerotic heart disease of native coronary artery without angina pectoris: Secondary | ICD-10-CM | POA: Diagnosis present

## 2019-06-04 DIAGNOSIS — N390 Urinary tract infection, site not specified: Secondary | ICD-10-CM | POA: Diagnosis present

## 2019-06-04 DIAGNOSIS — Z803 Family history of malignant neoplasm of breast: Secondary | ICD-10-CM | POA: Diagnosis not present

## 2019-06-04 DIAGNOSIS — C3412 Malignant neoplasm of upper lobe, left bronchus or lung: Secondary | ICD-10-CM | POA: Diagnosis present

## 2019-06-04 DIAGNOSIS — Z87891 Personal history of nicotine dependence: Secondary | ICD-10-CM | POA: Diagnosis not present

## 2019-06-04 LAB — BASIC METABOLIC PANEL
Anion gap: 9 (ref 5–15)
BUN: 9 mg/dL (ref 8–23)
CO2: 32 mmol/L (ref 22–32)
Calcium: 8.7 mg/dL — ABNORMAL LOW (ref 8.9–10.3)
Chloride: 93 mmol/L — ABNORMAL LOW (ref 98–111)
Creatinine, Ser: 0.65 mg/dL (ref 0.44–1.00)
GFR calc Af Amer: >60 mL/min (ref >60)
GFR calc non Af Amer: >60 mL/min (ref >60)
Glucose, Bld: 107 mg/dL — ABNORMAL HIGH (ref 70–99)
Potassium: 3.8 mmol/L (ref 3.5–5.1)
Sodium: 134 mmol/L — ABNORMAL LOW (ref 135–145)

## 2019-06-04 LAB — ECHOCARDIOGRAM COMPLETE
Height: 66 in
Weight: 2403.2 oz

## 2019-06-04 MED ORDER — ORAL CARE MOUTH RINSE
15.0000 mL | Freq: Two times a day (BID) | OROMUCOSAL | Status: DC
  Administered 2019-06-04 – 2019-06-06 (×6): 15 mL via OROMUCOSAL

## 2019-06-04 MED ORDER — FUROSEMIDE 10 MG/ML IJ SOLN
40.0000 mg | Freq: Four times a day (QID) | INTRAMUSCULAR | Status: AC
  Administered 2019-06-04 (×2): 40 mg via INTRAVENOUS
  Filled 2019-06-04 (×2): qty 4

## 2019-06-04 MED ORDER — POTASSIUM CHLORIDE CRYS ER 20 MEQ PO TBCR
20.0000 meq | EXTENDED_RELEASE_TABLET | Freq: Once | ORAL | Status: AC
  Administered 2019-06-04: 20 meq via ORAL
  Filled 2019-06-04: qty 1

## 2019-06-04 MED ORDER — SODIUM CHLORIDE 0.9% FLUSH
3.0000 mL | Freq: Two times a day (BID) | INTRAVENOUS | Status: DC
  Administered 2019-06-04 – 2019-06-05 (×5): 3 mL via INTRAVENOUS

## 2019-06-04 MED ORDER — SODIUM CHLORIDE 0.9% FLUSH: 3.0000 mL | INTRAVENOUS | Status: DC | PRN

## 2019-06-04 MED ORDER — ONDANSETRON HCL 4 MG/2ML IJ SOLN: 4.0000 mg | Freq: Four times a day (QID) | INTRAMUSCULAR | Status: DC | PRN

## 2019-06-04 MED ORDER — ACETAMINOPHEN 325 MG PO TABS: 650.0000 mg | ORAL_TABLET | ORAL | Status: DC | PRN

## 2019-06-04 MED ORDER — ENOXAPARIN SODIUM 40 MG/0.4ML ~~LOC~~ SOLN
40.0000 mg | SUBCUTANEOUS | Status: DC
  Administered 2019-06-04 – 2019-06-06 (×3): 40 mg via SUBCUTANEOUS
  Filled 2019-06-04 (×3): qty 0.4

## 2019-06-04 MED ORDER — NYSTATIN 100000 UNIT/GM EX POWD
Freq: Two times a day (BID) | CUTANEOUS | Status: AC
  Administered 2019-06-04 – 2019-06-06 (×6): via TOPICAL
  Filled 2019-06-04: qty 15

## 2019-06-04 MED ORDER — SODIUM CHLORIDE 0.9 % IV SOLN: 250.0000 mL | INTRAVENOUS | Status: DC | PRN

## 2019-06-04 MED ORDER — ASPIRIN EC 81 MG PO TBEC
81.0000 mg | DELAYED_RELEASE_TABLET | Freq: Every day | ORAL | Status: DC
  Administered 2019-06-04 – 2019-06-06 (×3): 81 mg via ORAL
  Filled 2019-06-04 (×3): qty 1

## 2019-06-04 MED ORDER — LISINOPRIL 5 MG PO TABS
5.0000 mg | ORAL_TABLET | Freq: Every day | ORAL | Status: DC
  Administered 2019-06-04: 5 mg via ORAL
  Filled 2019-06-04 (×2): qty 1

## 2019-06-04 NOTE — H&P (Signed)
History and Physical    Sandra Ferguson [name]:[id number] DOB: 1926/08/29 DOA: 06/03/2019  PCP: Merrilee Seashore, MD (Confirm with patient/family/NH records and if not entered, this has to be entered at [facility] point of entry) Patient coming from: Patient is coming from home  I have personally briefly reviewed patient's old medical records in [place]  Chief Complaint: Shortness of breath and weakness  HPI: Sandra Ferguson is a 85 y.o. female with medical history significant of small cell carcinoma of the lung in 2008, recurrent left upper lobe lesion large in size and consistent with new lung cancer, history of breast cancer.  Patient has become increasingly weak and more short of breath.  Has no prior cardiac history.  She has had no chest pain or chest discomfort.  Because of her weakness and shortness of breath she presents to [facility] long emergency department for evaluation.  EMS found the patient to be hypoxemic with an O2 saturation of 84% at home.   ED Course: Patient was hemodynamically stable in the emergency department.  Laboratory was unremarkable with normal renal function.  Patient did have a BNP of 904.  Chest x-ray with patchy atelectasis at both bases, increased nodule in the left upper lobe, new hilar mass.  CTA of the chest revealed a spiculated lesion of the left upper lobe at 3.1 cm.  This was increased from the previous study which nodule was 1.4 cm.  It out as showing mild pulmonary edema.  No PE was noted.  TRH was asked to admit this patient for management of new onset acute congestive heart failure with elevated BNP and abnormal findings on chest CT  Review of Systems: As per HPI otherwise 10 point review of systems negative.    Past Medical History:  Diagnosis Date  . Arthritis   . Asthma    very little  . Breast cancer ([place])   . Breast cancer, left breast (HCC)    Invasive Carcinoma; 0/2 Nodes Negative; Er100%, PR 8 % K1-67 13%, Her-2New No amplification  .  Diverticulosis   . DJD (degenerative joint disease)   . Full dentures   . Gastritis   . Interstitial cystitis   . Lung cancer [facility]) 2008   Small Cell LUng Cancer Right Upper Lobe  . Macular degeneration 2011  . Osteoporosis   . Phlebitis 1970  . S/P radiation therapy 11/27/2012-12/25/2012   Left Breast / 40.05Gy in 15 fractions/Left Breast Boost / 10 Gy in 5 fractions  . Shortness of breath   . Status post chemotherapy 10/03/2006 - 12/05/2006   5 Cycles of Adjuvant Carboplatin/ VP-16 with Neulasta Support   . Wears glasses     Past Surgical History:  Procedure Laterality Date  . BREAST LUMPECTOMY WITH NEEDLE LOCALIZATION AND AXILLARY SENTINEL LYMPH NODE BX  08/21/2012   Procedure: BREAST LUMPECTOMY WITH NEEDLE LOCALIZATION AND AXILLARY SENTINEL LYMPH NODE BX;  Surgeon: Stark Klein, MD;  Location: [facility];  Service: General;  Laterality: Left;  . BREAST SURGERY    . CHOLECYSTECTOMY     laparoscopic  . EYE SURGERY     rt cataract  . right upper lobectomy  09/01/2006   Social history -patient was married for 2 years and has been widowed for 9.  She has been living alone.  Raised 4 children, has 11 grandchildren and several great-grandchildren.  Patient did move to her daughter's house today.  She was seen by hospice nurse for intake today.   reports that  she quit smoking about 37 years ago. Her smokeless tobacco use includes snuff. She reports that she does not drink alcohol or use drugs.  Allergies  Allergen Reactions  . Morphine Sulfate Shortness Of Breath    Hallucinations and convulsions    Family History  Problem Relation Age of Onset  . Breast cancer Mother   . Breast cancer Sister        77 -sister (paternal side of family)  . Cancer Maternal Grandfather   . Breast cancer Maternal Grandmother   . Breast cancer Sister      Prior to Admission medications   Medication Sig Start Date End Date Taking? Authorizing Provider  ALPRAZolam Duanne Moron) 0.5 MG  tablet Take 0.25-0.5 mg by mouth 2 (two) times daily as needed for anxiety.  10/27/10  Yes [provider]  aspirin 81 MG tablet Take 81 mg by mouth daily as needed for pain.    Yes [provider]    Physical Exam: Vitals:   06/03/19 2115 06/03/19 2245 06/03/19 2335 06/04/19 0000  BP: (!) 135/95 (!) 145/72 127/78 127/67  Pulse: 93 90 87 83  Resp: (!) 23 (!) 24 (!) 24 (!) 23  Temp:      TempSrc:      SpO2: 99% 100% 100% 100%  Weight:      Height:        Constitutional: NAD, calm, comfortable Vitals:   06/03/19 2115 06/03/19 2245 06/03/19 2335 06/04/19 0000  BP: (!) 135/95 (!) 145/72 127/78 127/67  Pulse: 93 90 87 83  Resp: (!) 23 (!) 24 (!) 24 (!) 23  Temp:      TempSrc:      SpO2: 99% 100% 100% 100%  Weight:      Height:       General appearance: Nourished well-developed woman looking her stated chronologic age she is in no acute distress.   Eyes: PERRL, lids and conjunctivae normal ENMT: Mucous membranes are moist. Posterior pharynx clear of any exudate or lesions.Normal dentition.  Neck: normal, supple, no masses, no thyromegaly Respiratory: . Normal respiratory effort. No accessory muscle use.  Decreased breath sounds at the bases.  Mild upper airway wheeze with deep inspiration.  Minimal crackles at the bases. Cardiovascular: Regular rate and rhythm, no murmurs / rubs / gallops. No extremity edema. 2+ pedal pulses. No carotid bruits.  Abdomen: no tenderness, no masses palpated. No hepatosplenomegaly. Bowel sounds positive.  Musculoskeletal: no clubbing / cyanosis. No joint deformity upper and lower extremities. Good ROM, no contractures. Normal muscle tone for her age.  Skin: no rashes, lesions, ulcers. No induration Neurologic: CN 2-12 grossly intact. Sensation intact,  Psychiatric: Normal judgment and insight. Alert and oriented x 3. Normal mood.     Labs on Admission: I have personally reviewed following labs and imaging studies  CBC: Recent Labs   Lab 06/03/19 1622  WBC 5.3  NEUTROABS 3.9  HGB 11.9*  HCT 36.1  MCV 92.8  PLT 329   Basic Metabolic Panel: Recent Labs  Lab 06/03/19 1622  NA 135  K 3.8  CL 98  CO2 28  GLUCOSE 138*  BUN 12  CREATININE 0.66  CALCIUM 8.8*   GFR: Estimated Creatinine Clearance: 43.8 mL/min (by C-G formula based on SCr of 0.66 mg/dL). Liver Function Tests: Recent Labs  Lab 06/03/19 1622  AST 19  ALT 10  ALKPHOS 56  BILITOT 0.3  PROT 6.5  ALBUMIN 3.6   No results for input(s): LIPASE, AMYLASE in the last  168 hours. No results for input(s): AMMONIA in the last 168 hours. Coagulation Profile: No results for input(s): INR, PROTIME in the last 168 hours. Cardiac Enzymes: No results for input(s): CKTOTAL, CKMB, CKMBINDEX, TROPONINI in the last 168 hours. BNP (last 3 results) No results for input(s): PROBNP in the last 8760 hours. HbA1C: No results for input(s): HGBA1C in the last 72 hours. CBG: No results for input(s): GLUCAP in the last 168 hours. Lipid Profile: No results for input(s): CHOL, HDL, LDLCALC, TRIG, CHOLHDL, LDLDIRECT in the last 72 hours. Thyroid Function Tests: No results for input(s): TSH, T4TOTAL, FREET4, T3FREE, THYROIDAB in the last 72 hours. Anemia Panel: No results for input(s): VITAMINB12, FOLATE, FERRITIN, TIBC, IRON, RETICCTPCT in the last 72 hours. Urine analysis:    Component Value Date/Time   COLORURINE YELLOW 02/23/2017 1415   APPEARANCEUR HAZY (A) 02/23/2017 1415   LABSPEC 1.006 02/23/2017 1415   LABSPEC 1.005 12/31/2012 1505   PHURINE 6.0 02/23/2017 1415   GLUCOSEU NEGATIVE 02/23/2017 1415   GLUCOSEU Negative 12/31/2012 1505   HGBUR NEGATIVE 02/23/2017 1415   BILIRUBINUR NEGATIVE 02/23/2017 1415   BILIRUBINUR Negative 12/31/2012 1505   KETONESUR NEGATIVE 02/23/2017 1415   PROTEINUR NEGATIVE 02/23/2017 1415   UROBILINOGEN 0.2 12/31/2012 1505   NITRITE NEGATIVE 02/23/2017 1415   LEUKOCYTESUR LARGE (A) 02/23/2017 1415   LEUKOCYTESUR Moderate  12/31/2012 1505    Radiological Exams on Admission: Ct Angio Chest Pe W And/or Wo Contrast  Result Date: 06/03/2019 CLINICAL DATA:  Decreased O2 sats. Remote history of lung cancer and breast cancer. EXAM: CT ANGIOGRAPHY CHEST WITH CONTRAST TECHNIQUE: Multidetector CT imaging of the chest was performed using the standard protocol during bolus administration of intravenous contrast. Multiplanar CT image reconstructions and MIPs were obtained to evaluate the vascular anatomy. CONTRAST:  142mL OMNIPAQUE IOHEXOL 350 MG/ML SOLN COMPARISON:  01/19/2018 FINDINGS: Cardiovascular: No filling defects in the pulmonary arteries to suggest pulmonary emboli. Aortic and coronary artery calcifications diffusely. Heart is mildly enlarged. No evidence of aortic aneurysm. Mediastinum/Nodes: Borderline mediastinal lymph nodes. 7 mm AP window lymph node. No hilar or axillary adenopathy. Lungs/Pleura: Small bilateral pleural effusions. Interlobular septal thickening in the mid and lower lungs could reflect early edema. Left upper lobe mass measures 3.1 cm with spiculated margins concerning for primary lung cancer. Upper Abdomen: Imaging into the upper abdomen shows no acute findings. Musculoskeletal: Surgical changes in the left breast and axilla. Chest wall soft tissues unremarkable. No acute bony abnormality. Review of the MIP images confirms the above findings. IMPRESSION: No evidence of pulmonary embolus. Left upper lobe spiculated mass measures up to 3.1 cm and is concerning for primary lung cancer. Interstitial thickening and small bilateral effusions. Findings could reflect early/mild edema. Cardiomegaly. Coronary artery disease. Borderline sized mediastinal lymph nodes. Aortic Atherosclerosis (ICD10-I70.0). Electronically Signed   By: Rolm Baptise M.D.   On: 06/03/2019 20:19   Dg Chest Port 1 View  Result Date: 06/03/2019 CLINICAL DATA:  Hypoxia. Shortness of breath. History of right lung cancer, breast cancer and  dementia. EXAM: PORTABLE CHEST 1 VIEW COMPARISON:  12/07/2016. PET-CT dated 04/18/2018. FINDINGS: Borderline enlarged cardiac silhouette. Increased size of the previously demonstrated mass in the left upper lobe. Interval rounded mass-like density in the right hilum. Stable right superior hilar surgical clips and staples. Interval mild ill-defined density at the left lung base and right lateral lung base. Diffuse osteopenia. IMPRESSION: 1. Mild patchy atelectasis or pneumonia at both lung bases, left greater than right. 2. Increased size of the previously  demonstrated mass in the left upper lobe suspicious for an enlarging lung carcinoma. 3. Interval right hilar mass suspicious for recurrent lung carcinoma/metastatic disease. Electronically Signed   By: Claudie Revering M.D.   On: 06/03/2019 16:53    EKG: Independently reviewed.  AG with normal sinus rhythm, left bundle branch block.  No acute changes.  Assessment/Plan Active Problems:   Small cell lung cancer (HCC)   Centrilobular emphysema (HCC)   Acute diastolic heart failure (HCC)  (please populate well all problems here in Problem List. (For example, if patient is on BP meds at home and you resume or decide to hold them, it is a problem that needs to be her. Same for CAD, COPD, HLD and so on)   1.  Acute diastolic heart failure -with no prior cardiac history.  No prior studies.  Presenting with elevated BNP, increased shortness of breath, probable woman Neri edema on CTA Plan telemetry admission  Furosemide 40 mg 1 dose given in the ER, additional doses every 6 hours ordered.  Nexium ordered.  2D echo  2.  Lung cancer -patient had a history of small cell cancer of the lung.  Now has an enlarging spiculated lesion in the left upper lobe and a new hilar mass.  Highly suspicious for recurrent small cell carcinoma of the lung.  She had been evaluated for radiation therapy but after evaluation declined treatment.  Also declined invasive diagnostic  procedures. Plan -no further work-up at this time given the patient's reluctance for treatment  Patient has been seen by [facility] [place] for intake continue hospice treatment at discharge  PT and OT or evaluations ordered to determine if patient able to return to living independently if she   should stay with family Geralynn Ochs DVT prophylaxis: Low (Lovenox/Heparin/SCD's/anticoagulated/None (if comfort care) Code Status: DNR (Full/Partial (specify details) Family Communication: Daughter at the bedside.  She understands diagnosis and treatment plan and agrees.  All questions were answered (Specify name, relationship. Do not write "discussed with patient". Specify tel # if discussed over the phone) Disposition Plan: Home with daughter independently Baseline evaluations when medically stable (specify when and where you expect patient to be discharged) Consults called: None (with names) Admission status: Inpatient/telemetry (inpatient / obs / tele / medical floor / SDU)   Adella Hare MD [facility] Pager [phone]  If 7PM-7AM, please contact night-coverage [email or web address] Password [email or web address]  06/04/2019, 12:08 AM

## 2019-06-04 NOTE — Progress Notes (Signed)
No charge FU note  87F admitted early this AM with suspected acute diastolic heart failure. She also has a history of small cell lung cancer and recurrent pulmonary mass which she has reportedly decided not to take further treatment for, and is currently on home hospice. Seen and examined, she is feeling comfortable this morning and says she is breathing better. - IV lasix - already net negative 1110 cc today - echo pending - repeat CXR in AM

## 2019-06-04 NOTE — Progress Notes (Signed)
  Echocardiogram 2D Echocardiogram has been performed.  Sandra Ferguson 06/04/2019, 3:58 PM

## 2019-06-04 NOTE — Plan of Care (Signed)
  Problem: Education: Goal: Knowledge of General Education information will improve Description: Including pain rating scale, medication(s)/side effects and non-pharmacologic comfort measures 06/04/2019 1845 by Tomekia Helton, Scarlett Presto, RN Outcome: Progressing 06/04/2019 1759 by Novella Olive, RN Outcome: Progressing   Problem: Health Behavior/Discharge Planning: Goal: Ability to manage health-related needs will improve 06/04/2019 1845 by Airianna Kreischer, Scarlett Presto, RN Outcome: Progressing 06/04/2019 1759 by Novella Olive, RN Outcome: Progressing   Problem: Clinical Measurements: Goal: Ability to maintain clinical measurements within normal limits will improve 06/04/2019 1845 by Braydon Kullman, Scarlett Presto, RN Outcome: Progressing 06/04/2019 1759 by Amber Guthridge W, RN Outcome: Progressing Goal: Diagnostic test results will improve Outcome: Progressing Goal: Respiratory complications will improve Outcome: Progressing Goal: Cardiovascular complication will be avoided Outcome: Progressing   Problem: Activity: Goal: Risk for activity intolerance will decrease Outcome: Progressing   Problem: Nutrition: Goal: Adequate nutrition will be maintained Outcome: Progressing   Problem: Safety: Goal: Ability to remain free from injury will improve Outcome: Progressing   Problem: Skin Integrity: Goal: Risk for impaired skin integrity will decrease Outcome: Progressing

## 2019-06-04 NOTE — Evaluation (Signed)
Occupational Therapy Evaluation Patient Details Name: Sandra Ferguson MRN: [id number] DOB: 04-Nov-1926 Today's Date: 06/04/2019    History of Present Illness Patient is a 85 year old female with a history of small cell lung cancer, breast cancer, asthma who is is not currently getting any chemotherapy and is presenting today with shortness of breath.  For the last 5 days she has had worsening shortness of breath that is most pronounced on exertion.  She is also had mild cough and congestion but denies any productive cough, chest pain or fever.  She has not noticed any swelling in sitting and resting seems to make the shortness of breath better.  Hospice was coming out today for the first time for evaluation but due to her significant symptoms they recommended she come to the hospital for further evaluation.   Clinical Impression   Pt admitted with weakness.   Pt currently with functional limitations due to the deficits listed below (see OT Problem List).  Pt will benefit from skilled OT to increase their safety and independence with ADL and functional mobility for ADL to facilitate discharge to venue listed below.   Pt lives alone and will need increased Care at home.  Pt states daughter lives close but does not live with pt - but she may be able to arrange this.  Pt very sweet and motivated to work with OT     Follow Up Recommendations  Home health OT;Supervision/Assistance - 24 hour;Supervision - Intermittent(depending on care at home)          Precautions / Restrictions Precautions Precautions: Fall Restrictions Weight Bearing Restrictions: No      Mobility Bed Mobility Overal bed mobility: Needs Assistance Bed Mobility: Supine to Sit     Supine to sit: Min assist        Transfers Overall transfer level: Needs assistance Equipment used: Rolling walker (2 wheeled) Transfers: Sit to/from [facility] Sit to Stand: Min assist Stand pivot transfers: Min assist       General transfer comment: increased time and VC for safety    Balance                                           ADL either performed or assessed with clinical judgement   ADL Overall ADL's : Needs assistance/impaired Eating/Feeding: Set up;Sitting   Grooming: Wash/dry face;Oral care;Sitting;Cueing for safety   Upper Body Bathing: Set up;Sitting   Lower Body Bathing: Sit to/from stand;Cueing for safety;Cueing for sequencing;Minimal assistance   Upper Body Dressing : Set up;Sitting   Lower Body Dressing: Minimal assistance;Sit to/from stand;Cueing for safety;Cueing for compensatory techniques   Toilet Transfer: Minimal assistance;Moderate assistance;Ambulation;Cueing for safety;RW Toilet Transfer Details (indicate cue type and reason): pt walked to BR with OT using RW Toileting- Clothing Manipulation and Hygiene: Minimal assistance;Cueing for compensatory techniques;Cueing for safety;Sit to/from stand         General ADL Comments: pt lives alone and need 24/7 A at home for safety due to decreased balance with ADL activity     Vision Patient Visual Report: No change from baseline       Perception     Praxis      Pertinent Vitals/Pain Pain Assessment: No/denies pain     Hand Dominance     Extremity/Trunk Assessment Upper Extremity Assessment Upper Extremity Assessment: Generalized weakness  Communication Communication Communication: No difficulties   Cognition Arousal/Alertness: Awake/alert Behavior During Therapy: WFL for tasks assessed/performed Overall Cognitive Status: Within Functional Limits for tasks assessed                                                Home Living Family/patient expects to be discharged to:: Private residence Living Arrangements: Alone Available Help at Discharge: Family Type of Home: House Home Access: Level entry     [place]: One level     Bathroom Shower/Tub:  Tub/shower unit;Walk-in shower   Bathroom Toilet: Handicapped height     Home Equipment: Environmental consultant - 2 wheels;Cane - single point          Prior Functioning/Environment Level of Independence: Independent                 OT Problem List: Decreased strength;Decreased activity tolerance;Impaired balance (sitting and/or standing);Decreased safety awareness      OT Treatment/Interventions: Self-care/ADL training;DME and/or AE instruction;Patient/family education    OT Goals(Current goals can be found in the care plan section) Acute Rehab OT Goals Patient Stated Goal: home with daugther to A me OT Goal Formulation: With patient Time For Goal Achievement: 06/11/19 Potential to Achieve Goals: Good  OT Frequency: Min 2X/week   Barriers to Ferguson/C:               AM-PAC OT "6 Clicks" Daily Activity     Outcome Measure Help from another person eating meals?: None Help from another person taking care of personal grooming?: A Little Help from another person toileting, which includes using toliet, bedpan, or urinal?: A Little Help from another person bathing (including washing, rinsing, drying)?: A Little Help from another person to put on and taking off regular upper body clothing?: A Little Help from another person to put on and taking off regular lower body clothing?: A Little 6 Click Score: 19   End of Session Equipment Utilized During Treatment: Gait belt;Rolling walker Nurse Communication: Mobility status  Activity Tolerance: Patient tolerated treatment well Patient left: in chair  OT Visit Diagnosis: Unsteadiness on feet (R26.81);Other abnormalities of gait and mobility (R26.89);Muscle weakness (generalized) (M62.81);History of falling (Z91.81)                Time: 5631-4970 OT Time Calculation (min): 22 min Charges:  OT General Charges $OT Visit: 1 Visit OT Evaluation $OT Eval Moderate Complexity: 1 Mod  Sandra Ferguson, [place] [phone] Office- [phone]     Sandra Ferguson, Sandra Ferguson 06/04/2019, 1:10 PM

## 2019-06-05 ENCOUNTER — Inpatient Hospital Stay (HOSPITAL_COMMUNITY): Payer: Medicare Other

## 2019-06-05 ENCOUNTER — Encounter (HOSPITAL_COMMUNITY): Payer: Self-pay | Admitting: Cardiology

## 2019-06-05 DIAGNOSIS — I5023 Acute on chronic systolic (congestive) heart failure: Secondary | ICD-10-CM

## 2019-06-05 LAB — BASIC METABOLIC PANEL
Anion gap: 12 (ref 5–15)
BUN: 20 mg/dL (ref 8–23)
CO2: 32 mmol/L (ref 22–32)
Calcium: 9.2 mg/dL (ref 8.9–10.3)
Chloride: 93 mmol/L — ABNORMAL LOW (ref 98–111)
Creatinine, Ser: 0.8 mg/dL (ref 0.44–1.00)
GFR calc Af Amer: >60 mL/min (ref >60)
GFR calc non Af Amer: >60 mL/min (ref >60)
Glucose, Bld: 101 mg/dL — ABNORMAL HIGH (ref 70–99)
Potassium: 3.6 mmol/L (ref 3.5–5.1)
Sodium: 137 mmol/L (ref 135–145)

## 2019-06-05 LAB — TROPONIN I (HIGH SENSITIVITY): Troponin I (High Sensitivity): 760 ng/L (ref <18)

## 2019-06-05 LAB — BRAIN NATRIURETIC PEPTIDE: B Natriuretic Peptide: 402 pg/mL — ABNORMAL HIGH (ref 0.0–100.0)

## 2019-06-05 LAB — TSH: TSH: 3.652 u[IU]/mL (ref 0.350–4.500)

## 2019-06-05 LAB — T4, FREE: Free T4: 0.85 ng/dL (ref 0.61–1.12)

## 2019-06-05 MED ORDER — LISINOPRIL 5 MG PO TABS
2.5000 mg | ORAL_TABLET | Freq: Every day | ORAL | Status: DC
  Administered 2019-06-06: 2.5 mg via ORAL
  Filled 2019-06-05: qty 1

## 2019-06-05 MED ORDER — CARVEDILOL 3.125 MG PO TABS
3.1250 mg | ORAL_TABLET | Freq: Two times a day (BID) | ORAL | Status: DC
  Administered 2019-06-05 – 2019-06-06 (×2): 3.125 mg via ORAL
  Filled 2019-06-05 (×2): qty 1

## 2019-06-05 NOTE — Consult Note (Addendum)
Cardiology Consultation:   Patient ID: KANDY TOWERY MRN: [id number]; DOB: 02-Dec-1926  Admit date: 06/03/2019 Date of Consult: 06/05/2019  Primary Care Provider: Merrilee Seashore, MD Primary Cardiologist: [name], MD New  Primary Electrophysiologist:  None    Patient Profile:   ROJEAN IGE is a 85 y.o. female with a hx of lung cancer, breast cancer and dementia, hospice care and decreased sp02 to low 90s with any exertion.  who is being seen today for the evaluation of CHF at the request of Dr. Hollice Gong.  History of Present Illness:   Ms. Duffin with above hx of lung and breast cancer, no cardiac issues noted in chart, presented due to increased weakness and SOB and desat to 84%.  No chest pain.   Pt has seen Dr. Elsworth Soho in past.    Pt tells me she was not having problems before admit, her daughter noticed changes.  Pt denies chest pain or changes in SOB.   She did have a bad cold 2 weeks ago.  No edema prior to admit    EKG:  The EKG was personally reviewed and demonstrates:  SR at 85 with LBBB no old to compare Telemetry:  Telemetry was personally reviewed and demonstrates:   SR   Na 137, K+ 3.6, BUN 20, Cr 0.80 Hgb 11.9 WBC 5.3 and plts 219  BNP 904  CTA chest  06/03/19 IMPRESSION: No evidence of pulmonary embolus. Left upper lobe spiculated mass measures up to 3.1 cm and is concerning for primary lung cancer. Interstitial thickening and small bilateral effusions. Findings could reflect early/mild edema. Cardiomegaly. Coronary artery disease. Borderline sized mediastinal lymph nodes. Aortic Atherosclerosis   PCXR  IMPRESSION: 1. Mild patchy atelectasis or pneumonia at both lung bases, left greater than right. 2. Increased size of the previously demonstrated mass in the left upper lobe suspicious for an enlarging lung carcinoma. 3. Interval right hilar mass suspicious for recurrent lung carcinoma/metastatic disease.  ECHO 06/04/19 with EF 20-25%,  mildly increased LVH, G1DD, normal Lt atrial pressure. Mild MR, trivial TR.  Currently neg 2490 since admit and wt down 2 Kg.  BP 97/55  SR at 74  Lisinopril was added to meds  5 mg  Resting comfortable flat in bed.    Heart Pathway Score:     Past Medical History:  Diagnosis Date  . Arthritis   . Asthma    very little  . Breast cancer ([place])   . Breast cancer, left breast ([facility])    Invasive Carcinoma; 0/2 Nodes Negative; Er100%, PR 8 % K1-67 13%, Her-2New No amplification  . Diverticulosis   . DJD (degenerative joint disease)   . Full dentures   . Gastritis   . Interstitial cystitis   . Lung cancer [facility]) 2008   Small Cell LUng Cancer Right Upper Lobe  . Macular degeneration 2011  . Osteoporosis   . Phlebitis 1970  . S/P radiation therapy 11/27/2012-12/25/2012   Left Breast / 40.05Gy in 15 fractions/Left Breast Boost / 10 Gy in 5 fractions  . Shortness of breath   . Status post chemotherapy 10/03/2006 - 12/05/2006   5 Cycles of Adjuvant Carboplatin/ VP-16 with Neulasta Support   . Wears glasses     Past Surgical History:  Procedure Laterality Date  . BREAST LUMPECTOMY WITH NEEDLE LOCALIZATION AND AXILLARY SENTINEL LYMPH NODE BX  08/21/2012   Procedure: BREAST LUMPECTOMY WITH NEEDLE LOCALIZATION AND AXILLARY SENTINEL LYMPH NODE BX;  Surgeon: Stark Klein, MD;  Location: [facility];  Service: General;  Laterality: Left;  . BREAST SURGERY    . CHOLECYSTECTOMY     laparoscopic  . EYE SURGERY     rt cataract  . right upper lobectomy  09/01/2006     Home Medications:  Prior to Admission medications   Medication Sig Start Date End Date Taking? Authorizing Provider  ALPRAZolam Duanne Moron) 0.5 MG tablet Take 0.25-0.5 mg by mouth 2 (two) times daily as needed for anxiety.  10/27/10  Yes [provider]  aspirin 81 MG tablet Take 81 mg by mouth daily as needed for pain.    Yes [provider]    Inpatient Medications: Scheduled Meds: . aspirin EC  81 mg  Oral Daily  . enoxaparin (LOVENOX) injection  40 mg Subcutaneous Q24H  . lisinopril  5 mg Oral Daily  . mouth rinse  15 mL Mouth Rinse BID  . nystatin   Topical BID  . sodium chloride flush  3 mL Intravenous Q12H   Continuous Infusions: . sodium chloride     PRN Meds: sodium chloride, acetaminophen, ALPRAZolam, aspirin EC, ondansetron (ZOFRAN) IV, sodium chloride flush  Allergies:    Allergies  Allergen Reactions  . Morphine Sulfate Shortness Of Breath    Hallucinations and convulsions    Social History:   Social History   Socioeconomic History  . Marital status: Widowed    Spouse name: Not on file  . Number of children: Not on file  . Years of education: Not on file  . Highest education level: Not on file  Occupational History  . Not on file  Social Needs  . Financial resource strain: Not on file  . Food insecurity    Worry: Not on file    Inability: Not on file  . Transportation needs    Medical: No    Non-medical: No  Tobacco Use  . Smoking status: Former Smoker    Quit date: 08/16/1981    Years since quitting: 37.8  . Smokeless tobacco: Current User    Types: Snuff  Substance and Sexual Activity  . Alcohol use: No  . Drug use: No  . Sexual activity: Not on file  Lifestyle  . Physical activity    Days per week: Not on file    Minutes per session: Not on file  . Stress: Not on file  Relationships  . Social Herbalist on phone: Not on file    Gets together: Not on file    Attends religious service: Not on file    Active member of club or organization: Not on file    Attends meetings of clubs or organizations: Not on file    Relationship status: Not on file  . Intimate partner violence    Fear of current or ex partner: No    Emotionally abused: No    Physically abused: No    Forced sexual activity: No  Other Topics Concern  . Not on file  Social History Narrative  . Not on file    Family History:    Family History  Problem Relation  Age of Onset  . Breast cancer Mother   . Breast cancer Sister        6 -sister (paternal side of family)  . Cancer Maternal Grandfather   . Breast cancer Maternal Grandmother   . Breast cancer Sister      ROS:  Please see the history of present illness.  General:no colds or fevers, no weight changes  currently but 2-3 weeks ago she had bad cold. Skin:no rashes or ulcers HEENT:no blurred vision, no congestion CV:see HPI PUL:see HPI GI:no diarrhea constipation or melena, no indigestion GU:no hematuria, no dysuria MS:no joint pain, no claudication Neuro:no syncope, no lightheadedness Endo:no diabetes, no thyroid disease  All other ROS reviewed and negative.     Physical Exam/Data:   Vitals:   06/04/19 1600 06/04/19 2130 06/05/19 0500 06/05/19 0504  BP: (!) 108/58 94/72  (!) 97/55  Pulse: 73 77  76  Resp: 18 20  16   Temp: 98.8 F (37.1 C) 98.6 F (37 C)  98.4 F (36.9 C)  TempSrc: Oral Oral  Oral  SpO2: 94% (!) 87%  95%  Weight:   66 kg   Height:        Intake/Output Summary (Last 24 hours) at 06/05/2019 0847 Last data filed at 06/04/2019 1849 Gross per 24 hour  Intake 720 ml  Output 1200 ml  Net -480 ml   Last 3 Weights 06/05/2019 06/04/2019 06/03/2019  Weight (lbs) 145 lb 8 oz 150 lb 3.2 oz 145 lb  Weight (kg) 65.998 kg 68.13 kg 65.772 kg     Body mass index is 23.48 kg/m.  General:  Well nourished, well developed, in no acute distress HEENT: normal Lymph: no adenopathy Neck: mild JVD flat in bed Endocrine:  No thryomegaly Vascular: No carotid bruits; pedal pulses 2+ bilaterally  Cardiac:  normal S1, S2; RRR; no murmur gallup rub or click Lungs:  clear to auscultation bilaterally, no wheezing, rhonchi or rales  Abd: soft, nontender, no hepatomegaly  Ext: no edema Musculoskeletal:  No deformities, BUE and BLE strength normal and equal Skin: warm and dry  Neuro:  Alert and oriented, complains of being sleepy , no focal abnormalities noted Psych:  Normal  affect    Relevant CV Studies: ECHO  06/03/19 IMPRESSIONS    1. Left ventricular ejection fraction, by visual estimation, is 20 to 25%. The left ventricle has severely decreased function. There is mildly increased left ventricular hypertrophy.  2. Left ventricular diastolic parameters are consistent with Grade I diastolic dysfunction (impaired relaxation).  3. Global right ventricle has normal systolic function.The right ventricular size is normal.  4. Left atrial size was normal.  5. Right atrial size was normal.  6. Mild mitral annular calcification.  7. The mitral valve is abnormal. Mild mitral valve regurgitation. No evidence of mitral stenosis.  8. The tricuspid valve is normal in structure. Tricuspid valve regurgitation is trivial.  9. The aortic valve is tricuspid. Aortic valve regurgitation is not visualized. Mild aortic valve sclerosis without stenosis. 10. The pulmonic valve was normal in structure. Pulmonic valve regurgitation is not visualized. 11. Severe global reduction in LV systolic function; mild LVH; grade 1 diastolic dysfunction; mild MR.  FINDINGS  Left Ventricle: Left ventricular ejection fraction, by visual estimation, is 20 to 25%. The left ventricle has severely decreased function. There is mildly increased left ventricular hypertrophy. Left ventricular diastolic parameters are consistent with  Grade I diastolic dysfunction (impaired relaxation). Normal left atrial pressure.  Right Ventricle: The right ventricular size is normal.Global RV systolic function is has normal systolic function.  Left Atrium: Left atrial size was normal in size.  Right Atrium: Right atrial size was normal in size  Pericardium: There is no evidence of pericardial effusion.  Mitral Valve: The mitral valve is abnormal. There is mild thickening of the mitral valve leaflet(s). Mild mitral annular calcification. No evidence of mitral valve stenosis by observation.  Mild mitral valve  regurgitation.  Tricuspid Valve: The tricuspid valve is normal in structure. Tricuspid valve regurgitation is trivial.  Aortic Valve: The aortic valve is tricuspid. Aortic valve regurgitation is not visualized. Mild aortic valve sclerosis is present, with no evidence of aortic valve stenosis.  Pulmonic Valve: The pulmonic valve was normal in structure. Pulmonic valve regurgitation is not visualized.  Aorta: The aortic root is normal in size and structure.  Venous: The inferior vena cava was not well visualized.  Laboratory Data:  High Sensitivity Troponin:  No results for input(s): TROPONINIHS in the last 720 hours.   Chemistry Recent Labs  Lab 06/03/19 1622 06/04/19 0358 06/05/19 0333  NA 135 134* 137  K 3.8 3.8 3.6  CL 98 93* 93*  CO2 28 32 32  GLUCOSE 138* 107* 101*  BUN 12 9 20   CREATININE 0.66 0.65 0.80  CALCIUM 8.8* 8.7* 9.2  GFRNONAA >60 >60 >60  GFRAA >60 >60 >60  ANIONGAP 9 9 12     Recent Labs  Lab 06/03/19 1622  PROT 6.5  ALBUMIN 3.6  AST 19  ALT 10  ALKPHOS 56  BILITOT 0.3   Hematology Recent Labs  Lab 06/03/19 1622  WBC 5.3  RBC 3.89  HGB 11.9*  HCT 36.1  MCV 92.8  MCH 30.6  MCHC 33.0  RDW 13.8  PLT 219   BNP Recent Labs  Lab 06/03/19 1622  BNP 904.4*    DDimer No results for input(s): DDIMER in the last 168 hours.   Radiology/Studies:  Ct Angio Chest Pe W And/or Wo Contrast  Result Date: 06/03/2019 CLINICAL DATA:  Decreased O2 sats. Remote history of lung cancer and breast cancer. EXAM: CT ANGIOGRAPHY CHEST WITH CONTRAST TECHNIQUE: Multidetector CT imaging of the chest was performed using the standard protocol during bolus administration of intravenous contrast. Multiplanar CT image reconstructions and MIPs were obtained to evaluate the vascular anatomy. CONTRAST:  156mL OMNIPAQUE IOHEXOL 350 MG/ML SOLN COMPARISON:  01/19/2018 FINDINGS: Cardiovascular: No filling defects in the pulmonary arteries to suggest pulmonary emboli.  Aortic and coronary artery calcifications diffusely. Heart is mildly enlarged. No evidence of aortic aneurysm. Mediastinum/Nodes: Borderline mediastinal lymph nodes. 7 mm AP window lymph node. No hilar or axillary adenopathy. Lungs/Pleura: Small bilateral pleural effusions. Interlobular septal thickening in the mid and lower lungs could reflect early edema. Left upper lobe mass measures 3.1 cm with spiculated margins concerning for primary lung cancer. Upper Abdomen: Imaging into the upper abdomen shows no acute findings. Musculoskeletal: Surgical changes in the left breast and axilla. Chest wall soft tissues unremarkable. No acute bony abnormality. Review of the MIP images confirms the above findings. IMPRESSION: No evidence of pulmonary embolus. Left upper lobe spiculated mass measures up to 3.1 cm and is concerning for primary lung cancer. Interstitial thickening and small bilateral effusions. Findings could reflect early/mild edema. Cardiomegaly. Coronary artery disease. Borderline sized mediastinal lymph nodes. Aortic Atherosclerosis (ICD10-I70.0). Electronically Signed   By: Rolm Baptise M.D.   On: 06/03/2019 20:19   Dg Chest Port 1 View  Result Date: 06/03/2019 CLINICAL DATA:  Hypoxia. Shortness of breath. History of right lung cancer, breast cancer and dementia. EXAM: PORTABLE CHEST 1 VIEW COMPARISON:  12/07/2016. PET-CT dated 04/18/2018. FINDINGS: Borderline enlarged cardiac silhouette. Increased size of the previously demonstrated mass in the left upper lobe. Interval rounded mass-like density in the right hilum. Stable right superior hilar surgical clips and staples. Interval mild ill-defined density at the left lung base and right lateral lung base. Diffuse  osteopenia. IMPRESSION: 1. Mild patchy atelectasis or pneumonia at both lung bases, left greater than right. 2. Increased size of the previously demonstrated mass in the left upper lobe suspicious for an enlarging lung carcinoma. 3. Interval right  hilar mass suspicious for recurrent lung carcinoma/metastatic disease. Electronically Signed   By: Claudie Revering M.D.   On: 06/03/2019 16:53    Assessment and Plan:   1. Acute systolic/diastolic HF now neg 1007 since admit and lasix 40 every 6 hours.  Has rec'd 120 mg total - seems euvolemic today would hold lasix but will defer to Dr. Audie Box  2. Cardiomyopathy with EF 20-25% - ACE added but now hypotensive, would hold and start at 2.5 or 1.25 daily if she can tolerate.  No angina, has LBBB but no old EKGs to compare.  No troponins.  Will check TSH, troponin and BNP  3. CAD on CTA - has not had angina.  Pt does not seem interested in testing, but daughter may have other thoughts. May be prudent to treat medically. But would need discussion.  With cancer has not wished to have any painful procedures.  4. Lung cancer with increase of hilar mass.  No further treatment planned.  Pt is on hospice and DNR.        For questions or updates, please contact [place] Please consult [email or web address] for contact info under     Signed, Cecilie Kicks, NP  06/05/2019 8:47 AM

## 2019-06-05 NOTE — Evaluation (Signed)
Physical Therapy Evaluation Patient Details Name: Sandra Ferguson MRN: [id number] DOB: August 30, 1926 Today's Date: 06/05/2019   History of Present Illness  Patient is a 85 year old female with a history of small cell lung cancer, breast cancer, asthma who is is not currently getting any chemotherapy and was admitted with shortness of breath.  Hospice was coming out 11/2 for the first time for evaluation but due to her significant symptoms they recommended she come to the hospital for further evaluation.  On 06/05/19 pt with elevated high sensitivity troponin - reviewed cardiology note that recommended managing conservatively as pt wants comfort care and was asymptomatic.  Clinical Impression   Pt was able to complete transfers with min A but had 1 LOB with ambulation requiring mod A to recover.  She presents with impairments listed below (see PT problem list) and will benefit from PT services to address.  Pt is a fall risk and if family not able to provide 24 hr supervision initially she may need SNF.      Follow Up Recommendations Home health PT;Supervision/Assistance - 24 hour(If supervision not available initially then SNF as pt fall risk)    Equipment Recommendations       Recommendations for Other Services       Precautions / Restrictions Precautions Precautions: Fall      Mobility  Bed Mobility Overal bed mobility: Needs Assistance Bed Mobility: Supine to Sit     Supine to sit: Min assist        Transfers Overall transfer level: Needs assistance Equipment used: Rolling walker (2 wheeled) Transfers: Sit to/from [facility] Sit to Stand: Min assist Stand pivot transfers: Min assist       General transfer comment: increased time and VC for safety  Ambulation/Gait Ambulation/Gait assistance: Min guard;Mod assist Gait Distance (Feet): 50 Feet Assistive device: Rolling walker (2 wheeled) Gait Pattern/deviations: Decreased stride length;Shuffle Gait  velocity: decreased   General Gait Details: Pt was min guard except had 1 LOB with turning requiring mod A  Stairs            Wheelchair Mobility    Modified Rankin (Stroke Patients Only)       Balance Overall balance assessment: Needs assistance Sitting-balance support: No upper extremity supported;Feet supported Sitting balance-Leahy Scale: Good     Standing balance support: Bilateral upper extremity supported Standing balance-Leahy Scale: Fair                               Pertinent Vitals/Pain Pain Assessment: No/denies pain    Home Living Family/patient expects to be discharged to:: Private residence Living Arrangements: Alone Available Help at Discharge: Family;Available PRN/intermittently Type of Home: House Home Access: Stairs to enter Entrance Stairs-Rails: Can reach both Entrance Stairs-Number of Steps: 2 Home Layout: One level Home Equipment: Walker - 2 wheels;Cane - single point      Prior Function Level of Independence: Independent         Comments: Family assisted with IADLs and grocery shopping, but pt able to complete short community ambulation and ADLs.  Reports she occasionally used a RW     Hand Dominance        Extremity/Trunk Assessment   Upper Extremity Assessment Upper Extremity Assessment: Generalized weakness    Lower Extremity Assessment Lower Extremity Assessment: Generalized weakness    Cervical / Trunk Assessment Cervical / Trunk Assessment: Kyphotic  Communication   Communication: No difficulties  Cognition Arousal/Alertness: Awake/alert  Behavior During Therapy: WFL for tasks assessed/performed Overall Cognitive Status: Within Functional Limits for tasks assessed                                 General Comments: Pt reports being fatigued and wanting to sleep.  Additionally, she reports that she is doubtful family could provide 24 hr supervision.      General Comments General  comments (skin integrity, edema, etc.): VSS- O2 sats 96% and HR 80 bpm with walking    Exercises     Assessment/Plan    PT Assessment Patient needs continued PT services  PT Problem List Decreased strength;Decreased mobility;Decreased safety awareness;Decreased range of motion;Decreased activity tolerance;Cardiopulmonary status limiting activity;Decreased balance       PT Treatment Interventions DME instruction;Therapeutic exercise;Gait training;Balance training;Stair training;Neuromuscular re-education;Functional mobility training;Therapeutic activities;Patient/family education    PT Goals (Current goals can be found in the Care Plan section)  Acute Rehab PT Goals Patient Stated Goal: home, family to assist if able PT Goal Formulation: With patient Time For Goal Achievement: 06/19/19 Potential to Achieve Goals: Fair    Frequency Min 3X/week   Barriers to discharge Decreased caregiver support      Co-evaluation               AM-PAC PT "6 Clicks" Mobility  Outcome Measure Help needed turning from your back to your side while in a flat bed without using bedrails?: A Little Help needed moving from lying on your back to sitting on the side of a flat bed without using bedrails?: A Little Help needed moving to and from a bed to a chair (including a wheelchair)?: A Little Help needed standing up from a chair using your arms (e.g., wheelchair or bedside chair)?: A Little Help needed to walk in hospital room?: A Little Help needed climbing 3-5 steps with a railing? : A Lot 6 Click Score: 17    End of Session Equipment Utilized During Treatment: Gait belt Activity Tolerance: Patient limited by fatigue Patient left: in bed;with call bell/phone within reach;with bed alarm set Nurse Communication: Mobility status PT Visit Diagnosis: Unsteadiness on feet (R26.81);Other abnormalities of gait and mobility (R26.89);Muscle weakness (generalized) (M62.81)    Time: 1305-1330 PT Time  Calculation (min) (ACUTE ONLY): 25 min   Charges:   PT Evaluation $PT Eval Low Complexity: 1 Low          Maggie Font, PT Acute Rehab Services [phone]   Karlton Lemon 06/05/2019, 1:47 PM

## 2019-06-05 NOTE — Progress Notes (Signed)
PROGRESS NOTE  Sandra Ferguson:[id number] DOB: 1927/04/20 DOA: 06/03/2019 PCP: Merrilee Seashore, MD  Hospital Course/Subjective: Sandra Ferguson is a 85 y.o. female with medical history significant of small cell carcinoma of the lung in 2008, recurrent left upper lobe lesion large in size and consistent with new lung cancer, history of breast cancer.  Patient has become increasingly weak and more short of breath.  Has no prior cardiac history. Improving with diureiss, echo 11/3 shows new EF 20%.  Assessment/Plan: Active Problems:   Small cell lung cancer (HCC)   Centrilobular emphysema (HCC)   Acute diastolic heart failure (HCC) Acute systolic heart failure, global hypokinesis  1.  Acute systolic heart failure -with no prior cardiac history.  No prior studies, Echo 11/3 with EF 20%.  Presenting with elevated BNP, increased shortness of breath, pulmonary edema. - continue lasix - aspirin 81mg  daily - further recs per cardilogy consulted this AM  2.  Lung cancer -patient had a history of small cell cancer of the lung.  Now has an enlarging spiculated lesion in the left upper lobe and a new hilar mass.  Highly suspicious for recurrent small cell carcinoma of the lung.  She had been evaluated for radiation therapy but after evaluation declined treatment.  Also declined invasive diagnostic procedures. Plan -no further work-up at this time given the patient's reluctance for treatment             Patient has been seen by [facility] [place] for intake continue hospice treatment at discharge              PT and OT or evaluations ordered to determine if patient able to return to living independently if she               should stay with family, they recommend [facility] services at [place]  DVT prophylaxis: Lovenox Code Status: DNR   Family Communication: Daughter at the bedside at time of admission.  Disposition Plan: Home with daughter with [facility] Consults called: Cardiology  Admission  status: Inpatient/telemetry (inpatient / obs / tele / medical floor / SDU)   Objective: Vitals:   06/04/19 1600 06/04/19 2130 06/05/19 0500 06/05/19 0504  BP: (!) 108/58 94/72  (!) 97/55  Pulse: 73 77  76  Resp: 18 20  16   Temp: 98.8 F (37.1 C) 98.6 F (37 C)  98.4 F (36.9 C)  TempSrc: Oral Oral  Oral  SpO2: 94% (!) 87%  95%  Weight:   66 kg   Height:        Intake/Output Summary (Last 24 hours) at 06/05/2019 0804 Last data filed at 06/04/2019 1849 Gross per 24 hour  Intake 720 ml  Output 1200 ml  Net -480 ml   Filed Weights   06/03/19 1547 06/04/19 0223 06/05/19 0500  Weight: 65.8 kg 68.1 kg 66 kg     Exam: General:  Alert, oriented, calm, in no acute distress, no complaints Eyes: EOMI, clear sclerea Neck: supple, no masses, trachea mildline  Cardiovascular: RRR, no murmurs or rubs, BLE edema improving Respiratory: clear to auscultation bilaterally, no wheezes, no crackles  Abdomen: soft, nontender, nondistended, normal bowel tones heard  Skin: dry, no rashes  Musculoskeletal: no joint effusions, normal range of motion  Psychiatric: appropriate affect, normal speech  Neurologic: extraocular muscles intact, clear speech, moving all extremities with intact sensorium    Data Reviewed: CBC: Recent Labs  Lab 06/03/19 1622  WBC 5.3  NEUTROABS 3.9  HGB  11.9*  HCT 36.1  MCV 92.8  PLT 213   Basic Metabolic Panel: Recent Labs  Lab 06/03/19 1622 06/04/19 0358 06/05/19 0333  NA 135 134* 137  K 3.8 3.8 3.6  CL 98 93* 93*  CO2 28 32 32  GLUCOSE 138* 107* 101*  BUN 12 9 20   CREATININE 0.66 0.65 0.80  CALCIUM 8.8* 8.7* 9.2   GFR: Estimated Creatinine Clearance: 43.8 mL/min (by C-G formula based on SCr of 0.8 mg/dL). Liver Function Tests: Recent Labs  Lab 06/03/19 1622  AST 19  ALT 10  ALKPHOS 56  BILITOT 0.3  PROT 6.5  ALBUMIN 3.6   No results for input(s): LIPASE, AMYLASE in the last 168 hours. No results for input(s): AMMONIA in the last 168  hours. Coagulation Profile: No results for input(s): INR, PROTIME in the last 168 hours. Cardiac Enzymes: No results for input(s): CKTOTAL, CKMB, CKMBINDEX, TROPONINI in the last 168 hours. BNP (last 3 results) No results for input(s): PROBNP in the last 8760 hours. HbA1C: No results for input(s): HGBA1C in the last 72 hours. CBG: No results for input(s): GLUCAP in the last 168 hours. Lipid Profile: No results for input(s): CHOL, HDL, LDLCALC, TRIG, CHOLHDL, LDLDIRECT in the last 72 hours. Thyroid Function Tests: No results for input(s): TSH, T4TOTAL, FREET4, T3FREE, THYROIDAB in the last 72 hours. Anemia Panel: No results for input(s): VITAMINB12, FOLATE, FERRITIN, TIBC, IRON, RETICCTPCT in the last 72 hours. Urine analysis:    Component Value Date/Time   COLORURINE YELLOW 02/23/2017 1415   APPEARANCEUR HAZY (A) 02/23/2017 1415   LABSPEC 1.006 02/23/2017 1415   LABSPEC 1.005 12/31/2012 1505   PHURINE 6.0 02/23/2017 1415   GLUCOSEU NEGATIVE 02/23/2017 1415   GLUCOSEU Negative 12/31/2012 1505   HGBUR NEGATIVE 02/23/2017 1415   BILIRUBINUR NEGATIVE 02/23/2017 1415   BILIRUBINUR Negative 12/31/2012 1505   KETONESUR NEGATIVE 02/23/2017 1415   PROTEINUR NEGATIVE 02/23/2017 1415   UROBILINOGEN 0.2 12/31/2012 1505   NITRITE NEGATIVE 02/23/2017 1415   LEUKOCYTESUR LARGE (A) 02/23/2017 1415   LEUKOCYTESUR Moderate 12/31/2012 1505   Sepsis Labs: @LABRCNTIP (procalcitonin:4,lacticidven:4)  ) Recent Results (from the past 240 hour(s))  SARS CORONAVIRUS 2 (TAT 6-24 HRS) Nasopharyngeal Nasopharyngeal Swab     Status: None   Collection Time: 06/03/19  4:27 PM   Specimen: Nasopharyngeal Swab  Result Value Ref Range Status   SARS Coronavirus 2 NEGATIVE NEGATIVE Final    Comment: (NOTE) SARS-CoV-2 target nucleic acids are NOT DETECTED. The SARS-CoV-2 RNA is generally detectable in upper and lower respiratory specimens during the acute phase of infection. Negative results do not  preclude SARS-CoV-2 infection, do not rule out co-infections with other pathogens, and should not be used as the sole basis for treatment or other patient management decisions. Negative results must be combined with clinical observations, patient history, and epidemiological information. The expected result is Negative. Fact Sheet for Patients: SugarRoll.be Fact Sheet for Healthcare Providers: https://www.woods-mathews.com/ This test is not yet approved or cleared by the [place] FDA and  has been authorized for detection and/or diagnosis of SARS-CoV-2 by FDA under an Emergency Use Authorization (EUA). This EUA will remain  in effect (meaning this test can be used) for the duration of the COVID-19 declaration under Section 56 4(b)(1) of the Act, 21 U.S.C. section 360bbb-3(b)(1), unless the authorization is terminated or revoked sooner. Performed at [facility], [place] [address], [place], [place] [address]      Studies: No results found.  Scheduled Meds: . aspirin  EC  81 mg Oral Daily  . enoxaparin (LOVENOX) injection  40 mg Subcutaneous Q24H  . lisinopril  5 mg Oral Daily  . mouth rinse  15 mL Mouth Rinse BID  . nystatin   Topical BID  . sodium chloride flush  3 mL Intravenous Q12H    Continuous Infusions: . sodium chloride       LOS: 1 day   Time spent: 23 minutes  Kenzi Bardwell Marry Guan, MD [facility] Pager [phone]  If 7PM-7AM, please contact night-coverage [email or web address] Password [email or web address] 06/05/2019, 8:04 AM

## 2019-06-05 NOTE — Progress Notes (Signed)
CRITICAL VALUE ALERT  Critical Value:  Troponin 760  Date & Time Notified:  06/05/2019 1100  Provider Notified: Dr. Audie Box, Cardiology   Orders Received/Actions taken: No new orders received.

## 2019-06-05 NOTE — Progress Notes (Signed)
Occupational Therapy Treatment Patient Details Name: Sandra Ferguson MRN: [id number] DOB: 10/03/1926 Today's Date: 06/05/2019    History of present illness Patient is a 85 year old female with a history of small cell lung cancer, breast cancer, asthma who is is not currently getting any chemotherapy and was admitted with shortness of breath.  Hospice was coming out 11/2 for the first time for evaluation but due to her significant symptoms they recommended she come to the hospital for further evaluation.  On 06/05/19 pt with elevated high sensitivity troponin - reviewed cardiology note that recommended managing conservatively as pt wants comfort care and was asymptomatic.   OT comments  Spoke with pts son, Sandra Ferguson, on phone regarding DC plan.  Explained pt would need A with all ADL activity initially to prevent a fall.  Pts son verbalized understanding    Follow Up Recommendations  Home health OT;Supervision/Assistance - 24 hour(depending on care at home)    Equipment Recommendations  3 in 1 bedside commode    Recommendations for Other Services      Precautions / Restrictions Precautions Precautions: Fall       Mobility Bed Mobility Overal bed mobility: Needs Assistance Bed Mobility: Supine to Sit     Supine to sit: Min assist        Transfers Overall transfer level: Needs assistance Equipment used: Rolling walker (2 wheeled) Transfers: Sit to/from [facility] Sit to Stand: Min assist Stand pivot transfers: Min assist       General transfer comment: increased time and VC for safety    Balance Overall balance assessment: Needs assistance Sitting-balance support: No upper extremity supported;Feet supported Sitting balance-Leahy Scale: Good     Standing balance support: Bilateral upper extremity supported Standing balance-Leahy Scale: Fair                             ADL either performed or assessed with clinical judgement   ADL Overall  ADL's : Needs assistance/impaired Eating/Feeding: Set up;Sitting   Grooming: Wash/dry face;Oral care;Cueing for safety;Standing;Min guard                   Toilet Transfer: Minimal assistance;Moderate assistance;Cueing for safety;RW;BSC   Toileting- Clothing Manipulation and Hygiene: Minimal assistance;Cueing for compensatory techniques;Cueing for safety;Sit to/from stand         General ADL Comments: Spoke with pts son on the phone.Pt will be going home with daughter and will have 24/7 A with all  ADL activity     Vision Patient Visual Report: No change from baseline            Cognition Arousal/Alertness: Awake/alert Behavior During Therapy: WFL for tasks assessed/performed Overall Cognitive Status: Within Functional Limits for tasks assessed                                 General Comments: Pt reports being fatigued and wanting to sleep.  Additionally, she reports that she is doubtful family could provide 24 hr supervision.              General Comments VSS- O2 sats 96% and HR 80 bpm with walking    Pertinent Vitals/ Pain       Pain Assessment: No/denies pain  Home Living Family/patient expects to be discharged to:: Private residence Living Arrangements: Alone Available Help at Discharge: Family;Available PRN/intermittently Type of Home: House Home Access: Stairs to  enter Entrance Stairs-Number of Steps: 2 Entrance Stairs-Rails: Can reach both Home Layout: One level     Bathroom Shower/Tub: Tub/shower unit;Walk-in shower   Bathroom Toilet: Handicapped height     Home Equipment: Environmental consultant - 2 wheels;Cane - single point          Prior Functioning/Environment Level of Independence: Independent        Comments: Family assisted with IADLs and grocery shopping, but pt able to complete short community ambulation and ADLs.  Reports she occasionally used a RW   Frequency  Min 2X/week        Progress Toward Goals  OT Goals(current  goals can now be found in the care plan section)  Progress towards OT goals: Progressing toward goals  Acute Rehab OT Goals Patient Stated Goal: home, family to assist if able  Plan Discharge plan remains appropriate    Co-evaluation                 AM-PAC OT "6 Clicks" Daily Activity     Outcome Measure   Help from another person eating meals?: None Help from another person taking care of personal grooming?: A Little Help from another person toileting, which includes using toliet, bedpan, or urinal?: A Little Help from another person bathing (including washing, rinsing, drying)?: A Little Help from another person to put on and taking off regular upper body clothing?: A Little Help from another person to put on and taking off regular lower body clothing?: A Little 6 Click Score: 19    End of Session Equipment Utilized During Treatment: Gait belt;Rolling walker  OT Visit Diagnosis: Unsteadiness on feet (R26.81);Other abnormalities of gait and mobility (R26.89);Muscle weakness (generalized) (M62.81);History of falling (Z91.81)   Activity Tolerance Patient tolerated treatment well   Patient Left in bed;with call bell/phone within reach;with bed alarm set   Nurse Communication Mobility status        Time: 1610-9604 OT Time Calculation (min): 23 min  Charges: OT General Charges $OT Visit: 1 Visit OT Treatments $Self Care/Home Management : 23-37 mins  Kari Baars, [place] [phone] Office- [place], [name] D 06/05/2019, 2:57 PM

## 2019-06-06 DIAGNOSIS — I5021 Acute systolic (congestive) heart failure: Secondary | ICD-10-CM

## 2019-06-06 DIAGNOSIS — I5031 Acute diastolic (congestive) heart failure: Secondary | ICD-10-CM

## 2019-06-06 DIAGNOSIS — N390 Urinary tract infection, site not specified: Secondary | ICD-10-CM

## 2019-06-06 DIAGNOSIS — J432 Centrilobular emphysema: Secondary | ICD-10-CM

## 2019-06-06 LAB — URINALYSIS, ROUTINE W REFLEX MICROSCOPIC
Bilirubin Urine: NEGATIVE
Glucose, UA: NEGATIVE mg/dL
Hgb urine dipstick: NEGATIVE
Ketones, ur: NEGATIVE mg/dL
Nitrite: POSITIVE — AB
Protein, ur: NEGATIVE mg/dL
Specific Gravity, Urine: 1.015 (ref 1.005–1.030)
pH: 7 (ref 5.0–8.0)

## 2019-06-06 LAB — CBC WITH DIFFERENTIAL/PLATELET
Abs Immature Granulocytes: 0.02 10*3/uL (ref 0.00–0.07)
Basophils Absolute: 0 10*3/uL (ref 0.0–0.1)
Basophils Relative: 0 %
Eosinophils Absolute: 0.2 10*3/uL (ref 0.0–0.5)
Eosinophils Relative: 3 %
HCT: 36.9 % (ref 36.0–46.0)
Hemoglobin: 12.1 g/dL (ref 12.0–15.0)
Immature Granulocytes: 0 %
Lymphocytes Relative: 23 %
Lymphs Abs: 1.3 10*3/uL (ref 0.7–4.0)
MCH: 30.6 pg (ref 26.0–34.0)
MCHC: 32.8 g/dL (ref 30.0–36.0)
MCV: 93.4 fL (ref 80.0–100.0)
Monocytes Absolute: 0.5 10*3/uL (ref 0.1–1.0)
Monocytes Relative: 9 %
Neutro Abs: 3.5 10*3/uL (ref 1.7–7.7)
Neutrophils Relative %: 65 %
Platelets: 226 10*3/uL (ref 150–400)
RBC: 3.95 MIL/uL (ref 3.87–5.11)
RDW: 13.8 % (ref 11.5–15.5)
WBC: 5.4 10*3/uL (ref 4.0–10.5)
nRBC: 0 % (ref 0.0–0.2)

## 2019-06-06 LAB — BASIC METABOLIC PANEL
Anion gap: 11 (ref 5–15)
BUN: 21 mg/dL (ref 8–23)
CO2: 30 mmol/L (ref 22–32)
Calcium: 8.8 mg/dL — ABNORMAL LOW (ref 8.9–10.3)
Chloride: 93 mmol/L — ABNORMAL LOW (ref 98–111)
Creatinine, Ser: 0.66 mg/dL (ref 0.44–1.00)
GFR calc Af Amer: >60 mL/min (ref >60)
GFR calc non Af Amer: >60 mL/min (ref >60)
Glucose, Bld: 102 mg/dL — ABNORMAL HIGH (ref 70–99)
Potassium: 3.5 mmol/L (ref 3.5–5.1)
Sodium: 134 mmol/L — ABNORMAL LOW (ref 135–145)

## 2019-06-06 LAB — MAGNESIUM: Magnesium: 1.9 mg/dL (ref 1.7–2.4)

## 2019-06-06 LAB — PHOSPHORUS: Phosphorus: 3.9 mg/dL (ref 2.5–4.6)

## 2019-06-06 MED ORDER — CARVEDILOL 3.125 MG PO TABS: 3.1250 mg | ORAL_TABLET | Freq: Two times a day (BID) | ORAL | 0 refills | Status: DC

## 2019-06-06 MED ORDER — MAGNESIUM HYDROXIDE 400 MG/5ML PO SUSP
15.0000 mL | Freq: Every day | ORAL | Status: DC | PRN
  Administered 2019-06-06: 15 mL via ORAL
  Filled 2019-06-06: qty 30

## 2019-06-06 MED ORDER — POLYETHYLENE GLYCOL 3350 17 G PO PACK: 17.0000 g | PACK | Freq: Every day | ORAL | 0 refills | Status: AC

## 2019-06-06 MED ORDER — POLYETHYLENE GLYCOL 3350 17 G PO PACK: 17.0000 g | PACK | Freq: Every day | ORAL | 0 refills | Status: DC

## 2019-06-06 MED ORDER — POLYETHYLENE GLYCOL 3350 17 G PO PACK
17.0000 g | PACK | Freq: Two times a day (BID) | ORAL | Status: DC
  Administered 2019-06-06: 17 g via ORAL
  Filled 2019-06-06: qty 1

## 2019-06-06 MED ORDER — FUROSEMIDE 20 MG PO TABS: 20.0000 mg | ORAL_TABLET | Freq: Every day | ORAL | 11 refills | Status: DC

## 2019-06-06 MED ORDER — SENNOSIDES-DOCUSATE SODIUM 8.6-50 MG PO TABS
1.0000 | ORAL_TABLET | Freq: Two times a day (BID) | ORAL | Status: DC
  Administered 2019-06-06: 1 via ORAL
  Filled 2019-06-06: qty 1

## 2019-06-06 MED ORDER — ACETAMINOPHEN 325 MG PO TABS: 650.0000 mg | ORAL_TABLET | ORAL | 0 refills | Status: DC | PRN

## 2019-06-06 MED ORDER — BISACODYL 10 MG RE SUPP
10.0000 mg | Freq: Once | RECTAL | Status: AC
  Administered 2019-06-06: 10 mg via RECTAL
  Filled 2019-06-06: qty 1

## 2019-06-06 MED ORDER — ACETAMINOPHEN 325 MG PO TABS: 650.0000 mg | ORAL_TABLET | ORAL | 0 refills | Status: AC | PRN

## 2019-06-06 MED ORDER — SENNOSIDES-DOCUSATE SODIUM 8.6-50 MG PO TABS: 1.0000 | ORAL_TABLET | Freq: Every day | ORAL | 0 refills | Status: AC

## 2019-06-06 MED ORDER — SENNOSIDES-DOCUSATE SODIUM 8.6-50 MG PO TABS: 1.0000 | ORAL_TABLET | Freq: Every day | ORAL | 0 refills | Status: DC

## 2019-06-06 MED ORDER — LISINOPRIL 2.5 MG PO TABS: 2.5000 mg | ORAL_TABLET | Freq: Every day | ORAL | 0 refills | Status: DC

## 2019-06-06 MED ORDER — FOSFOMYCIN TROMETHAMINE 3 G PO PACK
3.0000 g | PACK | Freq: Once | ORAL | Status: AC
  Administered 2019-06-06: 3 g via ORAL
  Filled 2019-06-06: qty 3

## 2019-06-06 NOTE — TOC Progression Note (Signed)
Transition of Care [facility]) - Progression Note    Patient Details  Name: Sandra Ferguson MRN: [id number] Date of Birth: 04-20-1927  Transition of [facility]) CM/SW Contact  Purcell Mouton, RN Phone Number: 06/06/2019, 9:16 AM  Clinical Narrative:     Pt is active with [facility] and will discharge home with their services. In house rep Christia Reading [phone].       Expected Discharge Plan and Services                                                 Social Determinants of Health (SDOH) Interventions    Readmission Risk Interventions No flowsheet data found.

## 2019-06-06 NOTE — TOC Progression Note (Signed)
Transition of Care [facility]) - Progression Note    Patient Details  Name: [name] MRN: [id number] Date of Birth: [birth year]  Transition [facility]) CM/SW Contact  Purcell Mouton, RN Phone Number: 06/06/2019, 12:21 PM  Clinical Narrative:    Pt will discharge to Daughter [name]'s house [place], [place], [place] [address] and [facility].    Expected Discharge Plan: Home w Hospice Care Barriers to Discharge: No Barriers Identified  Expected Discharge Plan and Services Expected Discharge Plan: [place]   Discharge Planning Services: CM Consult Post Acute Care Choice: Hospice Living arrangements for the past 2 months: Single Family Home                           HH Arranged: RN [facility] Agency: [place] Date [place]: 06/06/19 Time [place]: 1220 Representative spoke with at [place]: [place] Determinants of Health ([place]) Interventions    Readmission Risk Interventions No flowsheet data found.

## 2019-06-06 NOTE — Discharge Summary (Signed)
Physician Discharge Summary  [name] XNA:[id number] DOB: 06-Nov-1926 DOA: 06/03/2019  PCP: Merrilee Seashore, MD  Admit date: 06/03/2019 Discharge date: 06/06/2019  Admitted From: Home Disposition: Home with Hospice   Recommendations for Outpatient Follow-up:  1. Follow up with PCP in 1-2 weeks 2. Follow up with Cardiology if Necessary 3. Further Care per Hospice Protocol  4. Please obtain CMP/CBC, Mag, Phos in one week 5. Please follow up on the following pending results: Urine Cx  Home Health: No Equipment/Devices: None    Discharge Condition: Stable CODE STATUS: DO NOT RESUSCITATE  Diet recommendation: Heart Healthy Diet  Brief/Interim Summary: Sandra Ferguson a 85 y.o.femalewith medical history significant ofsmall cell carcinoma of the lung in 2008, recurrent left upper lobe lesion large in size and consistent with new lung cancer, history of breast cancer. Patient has become increasingly weak and more short of breath. Has no prior cardiac history. Improving with diureiss, echo 11/3 shows new EF 20%.  Cardiology was consulted for her new acute systolic heart failure with a left bundle branch block and Dr. Audie Box feels that she has a new cardiomyopathy of unclear etiology and suspect that her left bundle branch block ventricular dyssynchrony is probably at play here.  He recommended optimal medical management and recommended carvedilol lisinopril and Lasix and recommending overall goal of care should be comfort.  Discharge Diagnoses:  Active Problems:   Small cell lung cancer (HCC)   Centrilobular emphysema (HCC)   Acute diastolic heart failure (HCC)  Acute systolic heart failure with an EF of 20 to 25% associated with a left bundle branch block -with no prior cardiac history. No prior studies, Echo 11/3 with EF 20%. Presenting with elevated BNP, increased shortness of breath, pulmonary edema. - continue lasix at 20 mg daily -Troponin was elevated at 760 and BNP  is trended down to 402 - aspirin 81mg  daily -Cardiology recommends optimal medical management and have added lisinopril 2.5 mg p.o. daily along with carvedilol 3.125 mg p.o. daily to help with her cardiomyopathy.  Dr. Davina Poke feels that she has a newly diagnosed cardiomyopathy of unclear etiology and suspect that her left bundle branch block ventricular dyssynchrony is probably at play here.  He recommends no further invasive medical treatment or work-up and recommends that the goal of care should be comfort  Lung Cancer  -patient had a history of small cell cancer of the lung. Now has an enlarging spiculated lesion in the left upper lobe and a new hilar mass. Highly suspicious for recurrent small cell carcinoma of the lung. She had been evaluated for radiation therapy but after evaluation declined treatment. Also declined invasive diagnostic procedures. -no further work-up at this time given the patient's reluctance for treatment Patient has been seen by [facility] [place] for intake continue hospice treatment at discharge  Constipation -Bowel regimen initiated and she improved prior to discharge  Hyponatremia -In the setting of diuresis -Continue to monitor and trend in the outpatient setting if necessary  Urinary discomfort and frequency associated with burning suspected UTI present on admission -Caryl Pina thought to be the vaginal wipes that the patient has been using -UA obtained and consistent with a urinary tract infection patient has had increased frequency despite the Lasix -Given a dose of fosfomycin next-we will need to follow-up on urine culture in outpatient setting her PCP adjust and treat if necessary  Discharge Instructions  Discharge Instructions    ([place]) Call MD:  Anytime you have any of the following  symptoms: 1) 3 pound weight gain in 24 hours or 5 pounds in 1 week 2) shortness of breath, with or without a dry hacking cough 3) swelling in  the hands, feet or stomach 4) if you have to sleep on extra pillows at night in order to breathe.   Complete by: As directed    Call MD for:  difficulty breathing, headache or visual disturbances   Complete by: As directed    Call MD for:  extreme fatigue   Complete by: As directed    Call MD for:  hives   Complete by: As directed    Call MD for:  persistant dizziness or light-headedness   Complete by: As directed    Call MD for:  persistant nausea and vomiting   Complete by: As directed    Call MD for:  redness, tenderness, or signs of infection (pain, swelling, redness, odor or green/yellow discharge around incision site)   Complete by: As directed    Call MD for:  severe uncontrolled pain   Complete by: As directed    Call MD for:  temperature >100.4   Complete by: As directed    Diet - low sodium heart healthy   Complete by: As directed    Discharge instructions   Complete by: As directed    You were cared for by a hospitalist during your hospital stay. If you have any questions about your discharge medications or the care you received while you were in the hospital after you are discharged, you can call the unit and ask to speak with the hospitalist on call if the hospitalist that took care of you is not available. Once you are discharged, your primary care physician will handle any further medical issues. Please note that NO REFILLS for any discharge medications will be authorized once you are discharged, as it is imperative that you return to your primary care physician (or establish a relationship with a primary care physician if you do not have one) for your aftercare needs so that they can reassess your need for medications and monitor your lab values.  Follow up with PCP and further care per Hospice Protocol. Have PCP evaluate if continues to have urinary burning after stoppin Take all medications as prescribed. If symptoms change or worsen please return to the ED for evaluation    Increase activity slowly   Complete by: As directed      Allergies as of 06/06/2019      Reactions   Morphine Sulfate Shortness Of Breath   Hallucinations and convulsions      Medication List    TAKE these medications   acetaminophen 325 MG tablet Commonly known as: TYLENOL Take 2 tablets (650 mg total) by mouth every 4 (four) hours as needed for headache or mild pain.   ALPRAZolam 0.5 MG tablet Commonly known as: XANAX Take 0.25-0.5 mg by mouth 2 (two) times daily as needed for anxiety.   aspirin 81 MG tablet Take 81 mg by mouth daily as needed for pain.   carvedilol 3.125 MG tablet Commonly known as: COREG Take 1 tablet (3.125 mg total) by mouth 2 (two) times daily with a meal.   furosemide 20 MG tablet Commonly known as: Lasix Take 1 tablet (20 mg total) by mouth daily.   lisinopril 2.5 MG tablet Commonly known as: ZESTRIL Take 1 tablet (2.5 mg total) by mouth daily. Start taking on: June 07, 2019   polyethylene glycol 17 g packet Commonly known as:  MIRALAX / GLYCOLAX Take 17 g by mouth daily.   senna-docusate 8.6-50 MG tablet Commonly known as: Senokot-S Take 1 tablet by mouth at bedtime.       Allergies  Allergen Reactions  . Morphine Sulfate Shortness Of Breath    Hallucinations and convulsions    Consultations:  Cardiology  Procedures/Studies: Dg Chest 2 View  Result Date: 06/05/2019 CLINICAL DATA:  History of lung cancer, weakness EXAM: CHEST - 2 VIEW COMPARISON:  CT chest 06/03/2019 FINDINGS: Stable left upper lobe pulmonary mass. Small bilateral pleural effusions. Mild bilateral interstitial thickening. No pneumothorax. Stable cardiomediastinal silhouette. Mild osteoarthritis of the right glenohumeral joint. Generalized osteopenia. IMPRESSION: 1. Bilateral small pleural effusions and interstitial thickening which may reflect mild pulmonary edema. 2. Stable left upper lobe pulmonary mass. Electronically Signed   By: Kathreen Devoid   On: 06/05/2019  09:28   Ct Angio Chest Pe W And/or Wo Contrast  Result Date: 06/03/2019 CLINICAL DATA:  Decreased O2 sats. Remote history of lung cancer and breast cancer. EXAM: CT ANGIOGRAPHY CHEST WITH CONTRAST TECHNIQUE: Multidetector CT imaging of the chest was performed using the standard protocol during bolus administration of intravenous contrast. Multiplanar CT image reconstructions and MIPs were obtained to evaluate the vascular anatomy. CONTRAST:  180mL OMNIPAQUE IOHEXOL 350 MG/ML SOLN COMPARISON:  01/19/2018 FINDINGS: Cardiovascular: No filling defects in the pulmonary arteries to suggest pulmonary emboli. Aortic and coronary artery calcifications diffusely. Heart is mildly enlarged. No evidence of aortic aneurysm. Mediastinum/Nodes: Borderline mediastinal lymph nodes. 7 mm AP window lymph node. No hilar or axillary adenopathy. Lungs/Pleura: Small bilateral pleural effusions. Interlobular septal thickening in the mid and lower lungs could reflect early edema. Left upper lobe mass measures 3.1 cm with spiculated margins concerning for primary lung cancer. Upper Abdomen: Imaging into the upper abdomen shows no acute findings. Musculoskeletal: Surgical changes in the left breast and axilla. Chest wall soft tissues unremarkable. No acute bony abnormality. Review of the MIP images confirms the above findings. IMPRESSION: No evidence of pulmonary embolus. Left upper lobe spiculated mass measures up to 3.1 cm and is concerning for primary lung cancer. Interstitial thickening and small bilateral effusions. Findings could reflect early/mild edema. Cardiomegaly. Coronary artery disease. Borderline sized mediastinal lymph nodes. Aortic Atherosclerosis (ICD10-I70.0). Electronically Signed   By: Rolm Baptise M.D.   On: 06/03/2019 20:19   Dg Chest Port 1 View  Result Date: 06/03/2019 CLINICAL DATA:  Hypoxia. Shortness of breath. History of right lung cancer, breast cancer and dementia. EXAM: PORTABLE CHEST 1 VIEW COMPARISON:   12/07/2016. PET-CT dated 04/18/2018. FINDINGS: Borderline enlarged cardiac silhouette. Increased size of the previously demonstrated mass in the left upper lobe. Interval rounded mass-like density in the right hilum. Stable right superior hilar surgical clips and staples. Interval mild ill-defined density at the left lung base and right lateral lung base. Diffuse osteopenia. IMPRESSION: 1. Mild patchy atelectasis or pneumonia at both lung bases, left greater than right. 2. Increased size of the previously demonstrated mass in the left upper lobe suspicious for an enlarging lung carcinoma. 3. Interval right hilar mass suspicious for recurrent lung carcinoma/metastatic disease. Electronically Signed   By: Claudie Revering M.D.   On: 06/03/2019 16:53    ECHOCARDIOGRAM IMPRESSIONS    1. Left ventricular ejection fraction, by visual estimation, is 20 to 25%. The left ventricle has severely decreased function. There is mildly increased left ventricular hypertrophy.  2. Left ventricular diastolic parameters are consistent with Grade I diastolic dysfunction (impaired relaxation).  3. Global right  ventricle has normal systolic function.The right ventricular size is normal.  4. Left atrial size was normal.  5. Right atrial size was normal.  6. Mild mitral annular calcification.  7. The mitral valve is abnormal. Mild mitral valve regurgitation. No evidence of mitral stenosis.  8. The tricuspid valve is normal in structure. Tricuspid valve regurgitation is trivial.  9. The aortic valve is tricuspid. Aortic valve regurgitation is not visualized. Mild aortic valve sclerosis without stenosis. 10. The pulmonic valve was normal in structure. Pulmonic valve regurgitation is not visualized. 11. Severe global reduction in LV systolic function; mild LVH; grade 1 diastolic dysfunction; mild MR.  FINDINGS  Left Ventricle: Left ventricular ejection fraction, by visual estimation, is 20 to 25%. The left ventricle has  severely decreased function. There is mildly increased left ventricular hypertrophy. Left ventricular diastolic parameters are consistent with  Grade I diastolic dysfunction (impaired relaxation). Normal left atrial pressure.  Right Ventricle: The right ventricular size is normal.Global RV systolic function is has normal systolic function.  Left Atrium: Left atrial size was normal in size.  Right Atrium: Right atrial size was normal in size  Pericardium: There is no evidence of pericardial effusion.  Mitral Valve: The mitral valve is abnormal. There is mild thickening of the mitral valve leaflet(s). Mild mitral annular calcification. No evidence of mitral valve stenosis by observation. Mild mitral valve regurgitation.  Tricuspid Valve: The tricuspid valve is normal in structure. Tricuspid valve regurgitation is trivial.  Aortic Valve: The aortic valve is tricuspid. Aortic valve regurgitation is not visualized. Mild aortic valve sclerosis is present, with no evidence of aortic valve stenosis.  Pulmonic Valve: The pulmonic valve was normal in structure. Pulmonic valve regurgitation is not visualized.  Aorta: The aortic root is normal in size and structure.  Venous: The inferior vena cava was not well visualized.       LEFT VENTRICLE PLAX 2D LVIDd:         5.00 cm LVIDs:         4.60 cm LV PW:         1.20 cm LV IVS:        1.40 cm LVOT diam:     2.00 cm LV SV:         21 ml LV SV Index:   11.69 LVOT Area:     3.14 cm    RIGHT VENTRICLE RV S prime:     11.00 cm/s TAPSE (M-mode): 1.6 cm  LEFT ATRIUM             Index LA diam:        3.20 cm 1.81 cm/m LA Vol (A2C):   46.3 ml 26.15 ml/m LA Vol (A4C):   41.3 ml 23.32 ml/m LA Biplane Vol: 44.6 ml 25.19 ml/m  AORTIC VALVE LVOT Vmax:   54.80 cm/s LVOT Vmean:  37.300 cm/s LVOT VTI:    0.101 m   AORTA Ao Root diam: 3.30 cm  MITRAL VALVE MV Area (PHT): 2.71 cm             SHUNTS MV PHT:        81.20  msec           Systemic VTI:  0.10 m MV Decel Time: 280 msec             Systemic Diam: 2.00 cm MV E velocity: 47.60 cm/s 103 cm/s MV A velocity: 75.00 cm/s 70.3 cm/s MV E/A ratio:  0.63  1.5   Subjective: Seen and examined at bedside and she was feeling better in the right any shortness of breath.  Had trouble with having a bowel movement but was improved.  Complained to the nurse about having some stinging after urinating and states that she has had increased urinary frequency.  No nausea or vomiting.  No other concerns or complaints at this time.  Discharge Exam: Vitals:   06/06/19 0456 06/06/19 1330  BP: (!) 122/54 (!) 109/55  Pulse: 70 70  Resp: 16   Temp: 98.1 F (36.7 C) 99.1 F (37.3 C)  SpO2: 93% 95%   Vitals:   06/05/19 2151 06/06/19 0456 06/06/19 0500 06/06/19 1330  BP: (!) 108/48 (!) 122/54  (!) 109/55  Pulse: 74 70  70  Resp: 18 16    Temp: 98.5 F (36.9 C) 98.1 F (36.7 C)  99.1 F (37.3 C)  TempSrc: Oral Oral  Oral  SpO2: 90% 93%  95%  Weight:   67.8 kg   Height:       General: Pt is alert, awake, not in acute distress Cardiovascular: RRR, S1/S2 +, no rubs, no gallops Respiratory: Diminished bilaterally, no wheezing, no rhonchi Abdominal: Soft, NT, ND, bowel sounds + Extremities: no edema, no cyanosis  The results of significant diagnostics from this hospitalization (including imaging, microbiology, ancillary and laboratory) are listed below for reference.    Microbiology: Recent Results (from the past 240 hour(s))  SARS CORONAVIRUS 2 (TAT 6-24 HRS) Nasopharyngeal Nasopharyngeal Swab     Status: None   Collection Time: 06/03/19  4:27 PM   Specimen: Nasopharyngeal Swab  Result Value Ref Range Status   SARS Coronavirus 2 NEGATIVE NEGATIVE Final    Comment: (NOTE) SARS-CoV-2 target nucleic acids are NOT DETECTED. The SARS-CoV-2 RNA is generally detectable in upper and lower respiratory specimens during the acute phase of infection.  Negative results do not preclude SARS-CoV-2 infection, do not rule out co-infections with other pathogens, and should not be used as the sole basis for treatment or other patient management decisions. Negative results must be combined with clinical observations, patient history, and epidemiological information. The expected result is Negative. Fact Sheet for Patients: SugarRoll.be Fact Sheet for Healthcare Providers: https://www.woods-mathews.com/ This test is not yet approved or cleared by the [place] FDA and  has been authorized for detection and/or diagnosis of SARS-CoV-2 by FDA under an Emergency Use Authorization (EUA). This EUA will remain  in effect (meaning this test can be used) for the duration of the COVID-19 declaration under Section 56 4(b)(1) of the Act, 21 U.S.C. section 360bbb-3(b)(1), unless the authorization is terminated or revoked sooner. Performed at [facility], [place] [address], [place], [place] [address]    Labs: BNP (last 3 results) Recent Labs    06/03/19 1622 06/05/19 1004  BNP 904.4* 086.7*   Basic Metabolic Panel: Recent Labs  Lab 06/03/19 1622 06/04/19 0358 06/05/19 0333 06/06/19 0354 06/06/19 0524  NA 135 134* 137 134*  --   K 3.8 3.8 3.6 3.5  --   CL 98 93* 93* 93*  --   CO2 28 32 32 30  --   GLUCOSE 138* 107* 101* 102*  --   BUN 12 9 20 21   --   CREATININE 0.66 0.65 0.80 0.66  --   CALCIUM 8.8* 8.7* 9.2 8.8*  --   MG  --   --   --   --  1.9  PHOS  --   --   --   --  3.9   Liver Function Tests: Recent Labs  Lab 06/03/19 1622  AST 19  ALT 10  ALKPHOS 56  BILITOT 0.3  PROT 6.5  ALBUMIN 3.6   No results for input(s): LIPASE, AMYLASE in the last 168 hours. No results for input(s): AMMONIA in the last 168 hours. CBC: Recent Labs  Lab 06/03/19 1622 06/06/19 0524  WBC 5.3 5.4  NEUTROABS 3.9 3.5  HGB 11.9* 12.1  HCT 36.1 36.9  MCV 92.8 93.4  PLT 219 226   Cardiac  Enzymes: No results for input(s): CKTOTAL, CKMB, CKMBINDEX, TROPONINI in the last 168 hours. BNP: Invalid input(s): POCBNP CBG: No results for input(s): GLUCAP in the last 168 hours. D-Dimer No results for input(s): DDIMER in the last 72 hours. Hgb A1c No results for input(s): HGBA1C in the last 72 hours. Lipid Profile No results for input(s): CHOL, HDL, LDLCALC, TRIG, CHOLHDL, LDLDIRECT in the last 72 hours. Thyroid function studies Recent Labs    06/05/19 1004  TSH 3.652   Anemia work up No results for input(s): VITAMINB12, FOLATE, FERRITIN, TIBC, IRON, RETICCTPCT in the last 72 hours. Urinalysis    Component Value Date/Time   COLORURINE YELLOW 06/06/2019 1433   APPEARANCEUR CLEAR 06/06/2019 1433   LABSPEC 1.015 06/06/2019 1433   LABSPEC 1.005 12/31/2012 1505   PHURINE 7.0 06/06/2019 1433   GLUCOSEU NEGATIVE 06/06/2019 1433   GLUCOSEU Negative 12/31/2012 1505   HGBUR NEGATIVE 06/06/2019 1433   BILIRUBINUR NEGATIVE 06/06/2019 1433   BILIRUBINUR Negative 12/31/2012 1505   KETONESUR NEGATIVE 06/06/2019 1433   PROTEINUR NEGATIVE 06/06/2019 1433   UROBILINOGEN 0.2 12/31/2012 1505   NITRITE POSITIVE (A) 06/06/2019 1433   LEUKOCYTESUR MODERATE (A) 06/06/2019 1433   LEUKOCYTESUR Moderate 12/31/2012 1505   Sepsis Labs Invalid input(s): PROCALCITONIN,  WBC,  LACTICIDVEN Microbiology Recent Results (from the past 240 hour(s))  SARS CORONAVIRUS 2 (TAT 6-24 HRS) Nasopharyngeal Nasopharyngeal Swab     Status: None   Collection Time: 06/03/19  4:27 PM   Specimen: Nasopharyngeal Swab  Result Value Ref Range Status   SARS Coronavirus 2 NEGATIVE NEGATIVE Final    Comment: (NOTE) SARS-CoV-2 target nucleic acids are NOT DETECTED. The SARS-CoV-2 RNA is generally detectable in upper and lower respiratory specimens during the acute phase of infection. Negative results do not preclude SARS-CoV-2 infection, do not rule out co-infections with other pathogens, and should not be used as  the sole basis for treatment or other patient management decisions. Negative results must be combined with clinical observations, patient history, and epidemiological information. The expected result is Negative. Fact Sheet for Patients: SugarRoll.be Fact Sheet for Healthcare Providers: https://www.woods-mathews.com/ This test is not yet approved or cleared by the [place] FDA and  has been authorized for detection and/or diagnosis of SARS-CoV-2 by FDA under an Emergency Use Authorization (EUA). This EUA will remain  in effect (meaning this test can be used) for the duration of the COVID-19 declaration under Section 56 4(b)(1) of the Act, 21 U.S.C. section 360bbb-3(b)(1), unless the authorization is terminated or revoked sooner. Performed at [facility], [place] [address], [place], [place] [address]    Time coordinating discharge: 35 minutes  SIGNED:  Kerney Elbe, DO [facility] 06/06/2019, 7:42 PM Pager is on [place]  If 7PM-7AM, please contact night-coverage [email or web address] Password [email or web address]

## 2019-06-06 NOTE — Progress Notes (Signed)
Cardiology Progress Note  Patient ID: Sandra Ferguson MRN: [id number] DOB: [birth year] Date of Encounter: 06/06/2019  Primary Cardiologist: [name], MD  Subjective  Doing well this morning without complaints.  Euvolemic on examination.  Telemetry unremarkable.  ROS:  All other ROS reviewed and negative. Pertinent positives noted in the HPI.     Inpatient Medications  Scheduled Meds: . aspirin EC  81 mg Oral Daily  . bisacodyl  10 mg Rectal Once  . carvedilol  3.125 mg Oral BID WC  . enoxaparin (LOVENOX) injection  40 mg Subcutaneous Q24H  . lisinopril  2.5 mg Oral Daily  . mouth rinse  15 mL Mouth Rinse BID  . polyethylene glycol  17 g Oral BID  . senna-docusate  1 tablet Oral BID   Continuous Infusions:  PRN Meds: acetaminophen, ALPRAZolam, magnesium hydroxide, ondansetron (ZOFRAN) IV   Vital Signs   Vitals:   06/05/19 1322 06/05/19 2151 06/06/19 0456 06/06/19 0500  BP: (!) 106/56 (!) 108/48 (!) 122/54   Pulse: 70 74 70   Resp: 18 18 16    Temp:  98.5 F (36.9 C) 98.1 F (36.7 C)   TempSrc:  Oral Oral   SpO2: 96% 90% 93%   Weight:    67.8 kg  Height:        Intake/Output Summary (Last 24 hours) at 06/06/2019 1133 Last data filed at 06/06/2019 0321 Gross per 24 hour  Intake 240 ml  Output 550 ml  Net -310 ml   Last 3 Weights 06/06/2019 06/05/2019 06/04/2019  Weight (lbs) 149 lb 8 oz 145 lb 8 oz 150 lb 3.2 oz  Weight (kg) 67.813 kg 65.998 kg 68.13 kg      Telemetry  Overnight telemetry shows normal sinus rhythm with heart rate in the 70-80 range, which I personally reviewed.   ECG  The most recent ECG shows normal sinus rhythm, left bundle branch block noted, which I personally reviewed.   Physical Exam   Vitals:   06/05/19 1322 06/05/19 2151 06/06/19 0456 06/06/19 0500  BP: (!) 106/56 (!) 108/48 (!) 122/54   Pulse: 70 74 70   Resp: 18 18 16    Temp:  98.5 F (36.9 C) 98.1 F (36.7 C)   TempSrc:  Oral Oral   SpO2: 96% 90% 93%   Weight:    67.8  kg  Height:         Intake/Output Summary (Last 24 hours) at 06/06/2019 1133 Last data filed at 06/06/2019 0321 Gross per 24 hour  Intake 240 ml  Output 550 ml  Net -310 ml    Last 3 Weights 06/06/2019 06/05/2019 06/04/2019  Weight (lbs) 149 lb 8 oz 145 lb 8 oz 150 lb 3.2 oz  Weight (kg) 67.813 kg 65.998 kg 68.13 kg    Body mass index is 24.13 kg/m.   General: Well nourished, well developed, in no acute distress Head: Atraumatic, normal size  Eyes: PEERLA, EOMI  Neck: Supple, no JVD Endocrine: No thryomegaly Cardiac: Normal S1, S2; RRR; no murmurs, rubs, or gallops Lungs: Clear to auscultation bilaterally, no wheezing, rhonchi or rales  Abd: Soft, nontender, no hepatomegaly  Ext: No edema, pulses 2+ Musculoskeletal: No deformities, BUE and BLE strength normal and equal Skin: Warm and dry, no rashes   Neuro: Alert, awake oriented to person, no focal deficits Psych: Normal mood and affect   Labs  High Sensitivity Troponin:   Recent Labs  Lab 06/05/19 1004  TROPONINIHS 760*     Cardiac EnzymesNo  results for input(s): TROPONINI in the last 168 hours. No results for input(s): TROPIPOC in the last 168 hours.  Chemistry Recent Labs  Lab 06/03/19 1622 06/04/19 0358 06/05/19 0333 06/06/19 0354  NA 135 134* 137 134*  K 3.8 3.8 3.6 3.5  CL 98 93* 93* 93*  CO2 28 32 32 30  GLUCOSE 138* 107* 101* 102*  BUN 12 9 20 21   CREATININE 0.66 0.65 0.80 0.66  CALCIUM 8.8* 8.7* 9.2 8.8*  PROT 6.5  --   --   --   ALBUMIN 3.6  --   --   --   AST 19  --   --   --   ALT 10  --   --   --   ALKPHOS 56  --   --   --   BILITOT 0.3  --   --   --   GFRNONAA >60 >60 >60 >60  GFRAA >60 >60 >60 >60  ANIONGAP 9 9 12 11     Hematology Recent Labs  Lab 06/03/19 1622 06/06/19 0524  WBC 5.3 5.4  RBC 3.89 3.95  HGB 11.9* 12.1  HCT 36.1 36.9  MCV 92.8 93.4  MCH 30.6 30.6  MCHC 33.0 32.8  RDW 13.8 13.8  PLT 219 226   BNP Recent Labs  Lab 06/03/19 1622 06/05/19 1004  BNP 904.4* 402.0*     DDimer No results for input(s): DDIMER in the last 168 hours.   Radiology  Dg Chest 2 View  Result Date: 06/05/2019 CLINICAL DATA:  History of lung cancer, weakness EXAM: CHEST - 2 VIEW COMPARISON:  CT chest 06/03/2019 FINDINGS: Stable left upper lobe pulmonary mass. Small bilateral pleural effusions. Mild bilateral interstitial thickening. No pneumothorax. Stable cardiomediastinal silhouette. Mild osteoarthritis of the right glenohumeral joint. Generalized osteopenia. IMPRESSION: 1. Bilateral small pleural effusions and interstitial thickening which may reflect mild pulmonary edema. 2. Stable left upper lobe pulmonary mass. Electronically Signed   By: Kathreen Devoid   On: 06/05/2019 09:28    Cardiac Studies  TTE 06/04/2019   1. Left ventricular ejection fraction, by visual estimation, is 20 to 25%. The left ventricle has severely decreased function. There is mildly increased left ventricular hypertrophy.  2. Left ventricular diastolic parameters are consistent with Grade I diastolic dysfunction (impaired relaxation).  3. Global right ventricle has normal systolic function.The right ventricular size is normal.  4. Left atrial size was normal.  5. Right atrial size was normal.  6. Mild mitral annular calcification.  7. The mitral valve is abnormal. Mild mitral valve regurgitation. No evidence of mitral stenosis.  8. The tricuspid valve is normal in structure. Tricuspid valve regurgitation is trivial.  9. The aortic valve is tricuspid. Aortic valve regurgitation is not visualized. Mild aortic valve sclerosis without stenosis. 10. The pulmonic valve was normal in structure. Pulmonic valve regurgitation is not visualized. 11. Severe global reduction in LV systolic function; mild LVH; grade 1 diastolic dysfunction; mild MR.  Patient Profile  Sandra Ferguson is a 85 y.o. female with recurrent lung cancer who was admitted with new onset systolic heart failure.  Assessment & Plan   1.  Acute  systolic heart failure, ejection fraction 20 to 25%, left bundle branch block -[name] has recurrent lung cancer with a large lung mass.  She is currently refused treatment for this and is under hospice care.  She does present with a newly diagnosed cardiomyopathy of unclear etiology.  I suspect that her left bundle branch block ventricular  dyssynchrony is probably at play here.  She does have coronary calcium seen on recent CT scan, but no history of prior myocardial infarction.  It appears she was at home with hospice and has no plans for aggressive medical therapy for cancer.  It is also possible that prior chemotherapy resulted in her cardiomyopathy.  Nonetheless, I discussed with her daughter yesterday that they plan to bring her home with hospice care and do not wish for aggressive medical treatments.  She is DNR at this time.  I do think it is reasonable just to continue carvedilol 3.125 mg daily as well as lisinopril 2.5 mg daily she is to help with her cardiomyopathy.  I would also recommend to discharge her with Lasix 20 mg daily.  This can be managed by the hospice liaison upon discharge.  She does not need follow-up with illnesses I do expect her life expectancy is rather limited.  The overall goal of her care should be comfort and she should not limit herself with fluid intake or any dietary restrictions that would cause her discomfort.  [facility] will sign off.   Medication Recommendations: Carvedilol 3.125 mg twice daily, lisinopril 2.5 mg daily, Lasix 20 mg daily Other recommendations (labs, testing, etc): None Follow up as an outpatient: No follow-up is needed as [name] will go home with hospice care  Signed, Addison Naegeli. Audie Box, [place]  06/06/2019 11:38 AM

## 2019-06-06 NOTE — TOC Progression Note (Signed)
Transition of Care [facility]) - Progression Note    Patient Details  Name: Sandra Ferguson MRN: [id number] Date of Birth: 1927/01/10  Transition of Care [facility]) CM/SW Contact  Purcell Mouton, RN Phone Number: 06/06/2019, 12:10 PM  Clinical Narrative:    Daughter [name] called to change from [facility] to [facility] [phone]. Tim from [employer] was called to inform him of this change. Daughter [name] also called [facility]. Referral called to [facility], spoke with [facility]. Pt will discharge to pt's home [address], [place],  [address]. Daughter [name] asked for home O2, this was shared with Daisy.         Expected Discharge Plan and Services                                                 Social Determinants of Health (SDOH) Interventions    Readmission Risk Interventions No flowsheet data found.

## 2019-06-08 LAB — URINE CULTURE: Culture: 70000 — AB

## 2019-06-19 ENCOUNTER — Encounter (INDEPENDENT_AMBULATORY_CARE_PROVIDER_SITE_OTHER): Payer: Medicare Other | Admitting: Ophthalmology

## 2019-11-07 ENCOUNTER — Emergency Department (HOSPITAL_COMMUNITY)

## 2019-11-07 ENCOUNTER — Encounter (HOSPITAL_COMMUNITY): Payer: Self-pay | Admitting: Emergency Medicine

## 2019-11-07 ENCOUNTER — Inpatient Hospital Stay (HOSPITAL_COMMUNITY)
Admission: EM | Admit: 2019-11-07 | Discharge: 2019-11-11 | DRG: 480 | Disposition: A | Discharge status: Hospice | Attending: Family Medicine | Admitting: Family Medicine

## 2019-11-07 ENCOUNTER — Other Ambulatory Visit: Payer: Self-pay

## 2019-11-07 DIAGNOSIS — H3530 Unspecified macular degeneration: Secondary | ICD-10-CM | POA: Diagnosis present

## 2019-11-07 DIAGNOSIS — Z20822 Contact with and (suspected) exposure to covid-19: Secondary | ICD-10-CM | POA: Diagnosis present

## 2019-11-07 DIAGNOSIS — W1830XA Fall on same level, unspecified, initial encounter: Secondary | ICD-10-CM | POA: Diagnosis present

## 2019-11-07 DIAGNOSIS — Z902 Acquired absence of lung [part of]: Secondary | ICD-10-CM

## 2019-11-07 DIAGNOSIS — D649 Anemia, unspecified: Secondary | ICD-10-CM | POA: Diagnosis present

## 2019-11-07 DIAGNOSIS — Z9221 Personal history of antineoplastic chemotherapy: Secondary | ICD-10-CM

## 2019-11-07 DIAGNOSIS — T148XXA Other injury of unspecified body region, initial encounter: Secondary | ICD-10-CM

## 2019-11-07 DIAGNOSIS — S72141A Displaced intertrochanteric fracture of right femur, initial encounter for closed fracture: Secondary | ICD-10-CM | POA: Diagnosis not present

## 2019-11-07 DIAGNOSIS — C50419 Malignant neoplasm of upper-outer quadrant of unspecified female breast: Secondary | ICD-10-CM | POA: Diagnosis not present

## 2019-11-07 DIAGNOSIS — Z87891 Personal history of nicotine dependence: Secondary | ICD-10-CM

## 2019-11-07 DIAGNOSIS — Z03818 Encounter for observation for suspected exposure to other biological agents ruled out: Secondary | ICD-10-CM | POA: Diagnosis not present

## 2019-11-07 DIAGNOSIS — E871 Hypo-osmolality and hyponatremia: Secondary | ICD-10-CM | POA: Diagnosis not present

## 2019-11-07 DIAGNOSIS — Y92013 Bedroom of single-family (private) house as the place of occurrence of the external cause: Secondary | ICD-10-CM

## 2019-11-07 DIAGNOSIS — I959 Hypotension, unspecified: Secondary | ICD-10-CM | POA: Diagnosis not present

## 2019-11-07 DIAGNOSIS — R52 Pain, unspecified: Secondary | ICD-10-CM | POA: Diagnosis not present

## 2019-11-07 DIAGNOSIS — I9581 Postprocedural hypotension: Secondary | ICD-10-CM | POA: Diagnosis not present

## 2019-11-07 DIAGNOSIS — C3490 Malignant neoplasm of unspecified part of unspecified bronchus or lung: Secondary | ICD-10-CM | POA: Diagnosis not present

## 2019-11-07 DIAGNOSIS — J45909 Unspecified asthma, uncomplicated: Secondary | ICD-10-CM | POA: Diagnosis present

## 2019-11-07 DIAGNOSIS — Z85118 Personal history of other malignant neoplasm of bronchus and lung: Secondary | ICD-10-CM | POA: Diagnosis not present

## 2019-11-07 DIAGNOSIS — J432 Centrilobular emphysema: Secondary | ICD-10-CM | POA: Diagnosis present

## 2019-11-07 DIAGNOSIS — C7802 Secondary malignant neoplasm of left lung: Secondary | ICD-10-CM | POA: Diagnosis not present

## 2019-11-07 DIAGNOSIS — Z79899 Other long term (current) drug therapy: Secondary | ICD-10-CM

## 2019-11-07 DIAGNOSIS — I110 Hypertensive heart disease with heart failure: Secondary | ICD-10-CM | POA: Diagnosis present

## 2019-11-07 DIAGNOSIS — Z7982 Long term (current) use of aspirin: Secondary | ICD-10-CM

## 2019-11-07 DIAGNOSIS — Z515 Encounter for palliative care: Secondary | ICD-10-CM

## 2019-11-07 DIAGNOSIS — I5043 Acute on chronic combined systolic (congestive) and diastolic (congestive) heart failure: Secondary | ICD-10-CM | POA: Diagnosis present

## 2019-11-07 DIAGNOSIS — Z803 Family history of malignant neoplasm of breast: Secondary | ICD-10-CM | POA: Diagnosis not present

## 2019-11-07 DIAGNOSIS — W19XXXA Unspecified fall, initial encounter: Secondary | ICD-10-CM

## 2019-11-07 DIAGNOSIS — Y92009 Unspecified place in unspecified non-institutional (private) residence as the place of occurrence of the external cause: Secondary | ICD-10-CM

## 2019-11-07 DIAGNOSIS — I447 Left bundle-branch block, unspecified: Secondary | ICD-10-CM | POA: Diagnosis present

## 2019-11-07 DIAGNOSIS — R110 Nausea: Secondary | ICD-10-CM | POA: Diagnosis not present

## 2019-11-07 DIAGNOSIS — Z9981 Dependence on supplemental oxygen: Secondary | ICD-10-CM

## 2019-11-07 DIAGNOSIS — S0990XA Unspecified injury of head, initial encounter: Secondary | ICD-10-CM | POA: Diagnosis not present

## 2019-11-07 DIAGNOSIS — S72001A Fracture of unspecified part of neck of right femur, initial encounter for closed fracture: Secondary | ICD-10-CM | POA: Diagnosis not present

## 2019-11-07 DIAGNOSIS — Z853 Personal history of malignant neoplasm of breast: Secondary | ICD-10-CM | POA: Diagnosis not present

## 2019-11-07 DIAGNOSIS — R0602 Shortness of breath: Secondary | ICD-10-CM | POA: Diagnosis not present

## 2019-11-07 DIAGNOSIS — I5042 Chronic combined systolic (congestive) and diastolic (congestive) heart failure: Secondary | ICD-10-CM | POA: Diagnosis present

## 2019-11-07 DIAGNOSIS — S72001D Fracture of unspecified part of neck of right femur, subsequent encounter for closed fracture with routine healing: Secondary | ICD-10-CM | POA: Diagnosis not present

## 2019-11-07 DIAGNOSIS — D630 Anemia in neoplastic disease: Secondary | ICD-10-CM | POA: Diagnosis present

## 2019-11-07 DIAGNOSIS — S299XXA Unspecified injury of thorax, initial encounter: Secondary | ICD-10-CM | POA: Diagnosis not present

## 2019-11-07 DIAGNOSIS — R918 Other nonspecific abnormal finding of lung field: Secondary | ICD-10-CM | POA: Diagnosis not present

## 2019-11-07 DIAGNOSIS — R079 Chest pain, unspecified: Secondary | ICD-10-CM | POA: Diagnosis not present

## 2019-11-07 DIAGNOSIS — R0902 Hypoxemia: Secondary | ICD-10-CM | POA: Diagnosis present

## 2019-11-07 DIAGNOSIS — Z8672 Personal history of thrombophlebitis: Secondary | ICD-10-CM

## 2019-11-07 DIAGNOSIS — M810 Age-related osteoporosis without current pathological fracture: Secondary | ICD-10-CM | POA: Diagnosis present

## 2019-11-07 DIAGNOSIS — F0390 Unspecified dementia without behavioral disturbance: Secondary | ICD-10-CM | POA: Diagnosis present

## 2019-11-07 DIAGNOSIS — Z885 Allergy status to narcotic agent status: Secondary | ICD-10-CM

## 2019-11-07 DIAGNOSIS — Z8719 Personal history of other diseases of the digestive system: Secondary | ICD-10-CM

## 2019-11-07 DIAGNOSIS — Z923 Personal history of irradiation: Secondary | ICD-10-CM | POA: Diagnosis not present

## 2019-11-07 DIAGNOSIS — Z66 Do not resuscitate: Secondary | ICD-10-CM | POA: Diagnosis present

## 2019-11-07 DIAGNOSIS — Z419 Encounter for procedure for purposes other than remedying health state, unspecified: Secondary | ICD-10-CM

## 2019-11-07 LAB — CBC WITH DIFFERENTIAL/PLATELET
Abs Immature Granulocytes: 0.03 10*3/uL (ref 0.00–0.07)
Basophils Absolute: 0 10*3/uL (ref 0.0–0.1)
Basophils Relative: 0 %
Eosinophils Absolute: 0.1 10*3/uL (ref 0.0–0.5)
Eosinophils Relative: 2 %
HCT: 35.1 % — ABNORMAL LOW (ref 36.0–46.0)
Hemoglobin: 11 g/dL — ABNORMAL LOW (ref 12.0–15.0)
Immature Granulocytes: 1 %
Lymphocytes Relative: 13 %
Lymphs Abs: 0.7 10*3/uL (ref 0.7–4.0)
MCH: 30.2 pg (ref 26.0–34.0)
MCHC: 31.3 g/dL (ref 30.0–36.0)
MCV: 96.4 fL (ref 80.0–100.0)
Monocytes Absolute: 0.4 10*3/uL (ref 0.1–1.0)
Monocytes Relative: 7 %
Neutro Abs: 4.3 10*3/uL (ref 1.7–7.7)
Neutrophils Relative %: 77 %
Platelets: 160 10*3/uL (ref 150–400)
RBC: 3.64 MIL/uL — ABNORMAL LOW (ref 3.87–5.11)
RDW: 12.9 % (ref 11.5–15.5)
WBC: 5.5 10*3/uL (ref 4.0–10.5)
nRBC: 0 % (ref 0.0–0.2)

## 2019-11-07 LAB — TYPE AND SCREEN
ABO/RH(D): A POS
Antibody Screen: NEGATIVE

## 2019-11-07 LAB — BASIC METABOLIC PANEL
Anion gap: 10 (ref 5–15)
BUN: 10 mg/dL (ref 8–23)
CO2: 28 mmol/L (ref 22–32)
Calcium: 8.9 mg/dL (ref 8.9–10.3)
Chloride: 99 mmol/L (ref 98–111)
Creatinine, Ser: 0.63 mg/dL (ref 0.44–1.00)
GFR calc Af Amer: >60 mL/min (ref >60)
GFR calc non Af Amer: >60 mL/min (ref >60)
Glucose, Bld: 117 mg/dL — ABNORMAL HIGH (ref 70–99)
Potassium: 4.8 mmol/L (ref 3.5–5.1)
Sodium: 137 mmol/L (ref 135–145)

## 2019-11-07 LAB — URINALYSIS, ROUTINE W REFLEX MICROSCOPIC
Bacteria, UA: NONE SEEN
Bilirubin Urine: NEGATIVE
Glucose, UA: NEGATIVE mg/dL
Hgb urine dipstick: NEGATIVE
Ketones, ur: 5 mg/dL — AB
Nitrite: NEGATIVE
Protein, ur: NEGATIVE mg/dL
Specific Gravity, Urine: 1.024 (ref 1.005–1.030)
WBC, UA: >50 WBC/hpf — ABNORMAL HIGH (ref 0–5)
pH: 5 (ref 5.0–8.0)

## 2019-11-07 LAB — PROTIME-INR
INR: 1.1 (ref 0.8–1.2)
Prothrombin Time: 14.5 seconds (ref 11.4–15.2)

## 2019-11-07 LAB — SARS CORONAVIRUS 2 (TAT 6-24 HRS): SARS Coronavirus 2: NEGATIVE

## 2019-11-07 MED ORDER — OXYCODONE-ACETAMINOPHEN 5-325 MG PO TABS
1.0000 | ORAL_TABLET | Freq: Once | ORAL | Status: AC
  Administered 2019-11-07: 1 via ORAL
  Filled 2019-11-07: qty 1

## 2019-11-07 MED ORDER — ENOXAPARIN SODIUM 40 MG/0.4ML ~~LOC~~ SOLN: 40.0000 mg | SUBCUTANEOUS | Status: DC

## 2019-11-07 MED ORDER — OXYCODONE-ACETAMINOPHEN 5-325 MG PO TABS
1.0000 | ORAL_TABLET | Freq: Four times a day (QID) | ORAL | Status: DC | PRN
  Administered 2019-11-07 – 2019-11-10 (×10): 1 via ORAL
  Filled 2019-11-07 (×11): qty 1

## 2019-11-07 MED ORDER — FENTANYL CITRATE (PF) 100 MCG/2ML IJ SOLN
25.0000 ug | INTRAMUSCULAR | Status: DC | PRN
  Administered 2019-11-07 – 2019-11-08 (×5): 25 ug via INTRAVENOUS
  Filled 2019-11-07 (×5): qty 2

## 2019-11-07 MED ORDER — FENTANYL CITRATE (PF) 100 MCG/2ML IJ SOLN
50.0000 ug | Freq: Once | INTRAMUSCULAR | Status: AC
  Administered 2019-11-07: 50 ug via INTRAVENOUS
  Filled 2019-11-07: qty 2

## 2019-11-07 NOTE — ED Provider Notes (Signed)
[place] EMERGENCY DEPARTMENT Provider Note   CSN: [id number] Arrival date & time: 11/07/19  1303     History Chief Complaint  Patient presents with  . Hip Pain  . Fall    Sandra Ferguson is a 86 y.o. female.  Presented to ER with chief complaint right hip pain.  History limited due to dementia.  Level 5 caveat.  Additional history obtained from report, daughters.  Unwitnessed fall, complaining of right hip pain.  Pain severe, no associated numbness, weakness.  May have hit head, no neck pain or back pain.   HPI     Past Medical History:  Diagnosis Date  . Arthritis   . Asthma    very little  . Breast cancer ([place])   . Breast cancer, left breast (HCC)    Invasive Carcinoma; 0/2 Nodes Negative; Er100%, PR 8 % K1-67 13%, Her-2New No amplification  . Diverticulosis   . DJD (degenerative joint disease)   . Full dentures   . Gastritis   . Interstitial cystitis   . Lung cancer [facility]) 2008   Small Cell LUng Cancer Right Upper Lobe  . Macular degeneration 2011  . Osteoporosis   . Phlebitis 1970  . S/P radiation therapy 11/27/2012-12/25/2012   Left Breast / 40.05Gy in 15 fractions/Left Breast Boost / 10 Gy in 5 fractions  . Shortness of breath   . Status post chemotherapy 10/03/2006 - 12/05/2006   5 Cycles of Adjuvant Carboplatin/ VP-16 with Neulasta Support   . Wears glasses     Patient Active Problem List   Diagnosis Date Noted  . Acute diastolic heart failure ([place]) 06/04/2019  . Malignant neoplasm of upper lobe of left lung ([place]) 05/02/2018  . Left upper lobe pulmonary nodule 02/21/2018  . Centrilobular emphysema ([place]) 02/21/2018  . Malignant neoplasm of upper-outer quadrant of female breast ([place]) 03/07/2014  . Urinary frequency 12/31/2012  . Osteoporosis 09/05/2012  . History of left breast cancer 08/15/2012  . Small cell lung cancer ([place]) 08/25/2011    Past Surgical History:  Procedure Laterality Date  . BREAST LUMPECTOMY WITH NEEDLE  LOCALIZATION AND AXILLARY SENTINEL LYMPH NODE BX  08/21/2012   Procedure: BREAST LUMPECTOMY WITH NEEDLE LOCALIZATION AND AXILLARY SENTINEL LYMPH NODE BX;  Surgeon: Stark Klein, MD;  Location: [place];  Service: General;  Laterality: Left;  . BREAST SURGERY    . CHOLECYSTECTOMY     laparoscopic  . EYE SURGERY     rt cataract  . right upper lobectomy  09/01/2006     OB History   No obstetric history on file.     Family History  Problem Relation Age of Onset  . Breast cancer Mother   . Breast cancer Sister        32 -sister (paternal side of family)  . Cancer Maternal Grandfather   . Breast cancer Maternal Grandmother   . Breast cancer Sister     Social History   Tobacco Use  . Smoking status: Former Smoker    Quit date: 08/16/1981    Years since quitting: 38.2  . Smokeless tobacco: Current User    Types: Snuff  Substance Use Topics  . Alcohol use: No  . Drug use: No    Home Medications Prior to Admission medications   Medication Sig Start Date End Date Taking? Authorizing Provider  acetaminophen (TYLENOL) 325 MG tablet Take 2 tablets (650 mg total) by mouth every 4 (four) hours as needed for headache or mild pain.  06/06/19   Sheikh, Omair Latif, DO  ALPRAZolam Duanne Moron) 0.5 MG tablet Take 0.25-0.5 mg by mouth 2 (two) times daily as needed for anxiety.  10/27/10   [provider]  aspirin 81 MG tablet Take 81 mg by mouth daily as needed for pain.     [provider]  carvedilol (COREG) 3.125 MG tablet Take 1 tablet (3.125 mg total) by mouth 2 (two) times daily with a meal. 06/06/19   Sheikh, Omair Latif, DO  furosemide (LASIX) 20 MG tablet Take 1 tablet (20 mg total) by mouth daily. 06/06/19 06/05/20  Raiford Noble Latif, DO  lisinopril (ZESTRIL) 2.5 MG tablet Take 1 tablet (2.5 mg total) by mouth daily. 06/07/19   Sheikh, Georgina Quint Latif, DO  polyethylene glycol (MIRALAX / GLYCOLAX) 17 g packet Take 17 g by mouth daily. 06/06/19   Sheikh, Omair  Latif, DO  senna-docusate (SENOKOT-S) 8.6-50 MG tablet Take 1 tablet by mouth at bedtime. 06/06/19   Raiford Noble Latif, DO    Allergies    Morphine sulfate  Review of Systems   Review of Systems  Unable to perform ROS: Dementia  Musculoskeletal: Positive for arthralgias.    Physical Exam Updated Vital Signs BP 121/71   Pulse 66   Temp 98.5 F (36.9 C) (Oral)   Resp 16   Ht 5\' 4"  (1.626 m)   Wt 64.4 kg   SpO2 98%   BMI 24.37 kg/m   Physical Exam Vitals and nursing note reviewed.  Constitutional:      General: She is not in acute distress.    Appearance: She is well-developed.  HENT:     Head: Normocephalic and atraumatic.  Eyes:     Conjunctiva/sclera: Conjunctivae normal.  Cardiovascular:     Rate and Rhythm: Normal rate and regular rhythm.     Heart sounds: No murmur.  Pulmonary:     Effort: Pulmonary effort is normal. No respiratory distress.     Breath sounds: Normal breath sounds.  Abdominal:     Palpations: Abdomen is soft.     Tenderness: There is no abdominal tenderness.  Musculoskeletal:     Cervical back: Neck supple.     Comments: Back: No C, T, L-spine tenderness RLE: Right leg is shortened, externally rotated, distal sensation, DP and PT pulses intact, tenderness over right lateral hip LLE: no TTP throughout RUE: no TTP throughout LUE: no TTP throughout  Skin:    General: Skin is warm and dry.     Capillary Refill: Capillary refill takes less than 2 seconds.  Neurological:     General: No focal deficit present.     Mental Status: She is alert.     ED Results / Procedures / Treatments   Labs (all labs ordered are listed, but only abnormal results are displayed) Labs Reviewed  CBC WITH DIFFERENTIAL/PLATELET - Abnormal; Notable for the following components:      Result Value   RBC 3.64 (*)    Hemoglobin 11.0 (*)    HCT 35.1 (*)    All other components within normal limits  BASIC METABOLIC PANEL - Abnormal; Notable for the following  components:   Glucose, Bld 117 (*)    All other components within normal limits  SARS CORONAVIRUS 2 (TAT 6-24 HRS)  PROTIME-INR  URINALYSIS, ROUTINE W REFLEX MICROSCOPIC  TYPE AND SCREEN    EKG None  Radiology DG Chest 1 View  Result Date: 11/07/2019 CLINICAL DATA:  Pain following fall EXAM: CHEST  1 VIEW COMPARISON:  June 05, 2019 chest radiograph and chest CT June 03, 2019 FINDINGS: There is stable elevation of the right hemidiaphragm. The opacity in the left upper lobe which appears masslike on prior CT is again noted, currently measuring 5.1 x 4.3 cm, larger than on previous studies. There is atelectatic change in each lower lung region. Heart size and pulmonary vascular normal. No adenopathy. There is aortic atherosclerosis. No adenopathy appreciable. No pneumothorax. No bone lesions evident. IMPRESSION: 1. Apparent mass in left upper lobe, larger than on previous study. Neoplasm in this area is suspected. 2. Stable elevation the right hemidiaphragm. Areas of lower lobe region atelectatic change bilaterally. 3.  Heart size normal. 4.  No evident adenopathy. 5.  Aortic Atherosclerosis (ICD10-I70.0). Electronically Signed   By: Lowella Grip III M.D.   On: 11/07/2019 14:43   CT Head Wo Contrast  Result Date: 11/07/2019 CLINICAL DATA:  Fall with trauma to the head. EXAM: CT HEAD WITHOUT CONTRAST TECHNIQUE: Contiguous axial images were obtained from the base of the skull through the vertex without intravenous contrast. COMPARISON:  11/07/2011.  12/07/2016. FINDINGS: Brain: Age related atrophy. Chronic small-vessel ischemic changes of the hemispheric white matter. Old lacunar infarction left basal ganglia/anterior limb internal capsule, lateral left thalamus/posterior limb internal capsule and right external capsule. No mass, hemorrhage, hydrocephalus or extra-axial collection. Vascular: There is atherosclerotic calcification of the major vessels at the base of the brain. Skull: No skull  fracture Sinuses/Orbits: Sinuses are clear. Old medial orbital blowout fracture on the left. Other: None IMPRESSION: No acute or traumatic finding. Atrophy and chronic small-vessel ischemic changes, progressive since 2018. Electronically Signed   By: Nelson Chimes M.D.   On: 11/07/2019 14:43   DG Hip Unilat W or Wo Pelvis 2-3 Views Right  Result Date: 11/07/2019 CLINICAL DATA:  Pain following fall EXAM: DG HIP (WITH OR WITHOUT PELVIS) 2-3V RIGHT COMPARISON:  None. FINDINGS: Frontal pelvis as well as frontal and lateral right hip images were obtained. There is a comminuted intertrochanteric femur fracture on the right with varus angulation at the fracture site. There is avulsion of the lesser trochanter and avulsion of a portion of the greater trochanter on the right. No other fracture. No dislocation. Bones are diffusely osteoporotic. There is mild symmetric narrowing of each hip joint as well as degenerative change in the lower lumbar spine. IMPRESSION: 1. Comminuted intertrochanteric femur fracture on the right with varus angulation at the fracture site. 2.  Diffuse osteoporosis. 3.  No dislocation. 4. Symmetric narrowing of each hip joint. Degenerative change noted in the visualized lower lumbar region. Electronically Signed   By: Lowella Grip III M.D.   On: 11/07/2019 14:48    Procedures Procedures (including critical care time)  Medications Ordered in ED Medications  oxyCODONE-acetaminophen (PERCOCET/ROXICET) 5-325 MG per tablet 1 tablet (1 tablet Oral Given 11/07/19 1354)  fentaNYL (SUBLIMAZE) injection 50 mcg (50 mcg Intravenous Given 11/07/19 1458)    ED Course  I have reviewed the triage vital signs and the nursing notes.  Pertinent labs & imaging results that were available during my care of the patient were reviewed by me and considered in my medical decision making (see chart for details).    MDM Rules/Calculators/A&P                      86 year old lady HF, recurrent lung CA on  hospice presenting to ER after fall.  Exam concerning for right leg externally rotated, shortened, tenderness over right hip.  X-ray confirms hip fracture.  Patient currently enrolled in hospice program due to her lung cancer and heart failure.  Long discussion with Ortho, both of patient's daughters.  To increase her quality of life, they are very interested in pursuing surgery, understanding patient is a high risk surgical candidate.  Will obtain basic lab work, will consult hospitalist for admission.  Dr. Tamala Julian will admit.   Final Clinical Impression(s) / ED Diagnoses Final diagnoses:  Closed fracture of right hip, initial encounter [facility])    Rx / DC Orders ED Discharge Orders    None       Lucrezia Starch, MD 11/07/19 1551

## 2019-11-07 NOTE — H&P (View-Only) (Signed)
Reason for Consult:Hip fx Referring Physician: [name] is an 86 y.o. female.  HPI: Sandra Ferguson was at home and fell. She had immediate right hip pain and could not get up. She was brought to the ED where x-rays showed a right hip fracture and orthopedic surgery was consulted. She lives with family and hospice is treating her at home.  Past Medical History:  Diagnosis Date  . Arthritis   . Asthma    very little  . Breast cancer ([place])   . Breast cancer, left breast (HCC)    Invasive Carcinoma; 0/2 Nodes Negative; Er100%, PR 8 % K1-67 13%, Her-2New No amplification  . Diverticulosis   . DJD (degenerative joint disease)   . Full dentures   . Gastritis   . Interstitial cystitis   . Lung cancer [facility]) 2008   Small Cell LUng Cancer Right Upper Lobe  . Macular degeneration 2011  . Osteoporosis   . Phlebitis 1970  . S/P radiation therapy 11/27/2012-12/25/2012   Left Breast / 40.05Gy in 15 fractions/Left Breast Boost / 10 Gy in 5 fractions  . Shortness of breath   . Status post chemotherapy 10/03/2006 - 12/05/2006   5 Cycles of Adjuvant Carboplatin/ VP-16 with Neulasta Support   . Wears glasses     Past Surgical History:  Procedure Laterality Date  . BREAST LUMPECTOMY WITH NEEDLE LOCALIZATION AND AXILLARY SENTINEL LYMPH NODE BX  08/21/2012   Procedure: BREAST LUMPECTOMY WITH NEEDLE LOCALIZATION AND AXILLARY SENTINEL LYMPH NODE BX;  Surgeon: Stark Klein, MD;  Location: [place];  Service: General;  Laterality: Left;  . BREAST SURGERY    . CHOLECYSTECTOMY     laparoscopic  . EYE SURGERY     rt cataract  . right upper lobectomy  09/01/2006    Family History  Problem Relation Age of Onset  . Breast cancer Mother   . Breast cancer Sister        66 -sister (paternal side of family)  . Cancer Maternal Grandfather   . Breast cancer Maternal Grandmother   . Breast cancer Sister     Social History:  reports that she quit smoking about 38 years ago. Her  smokeless tobacco use includes snuff. She reports that she does not drink alcohol or use drugs.  Allergies:  Allergies  Allergen Reactions  . Morphine Sulfate Shortness Of Breath    Hallucinations and convulsions    Medications: I have reviewed the patient's current medications.  No results found for this or any previous visit (from the past 48 hour(s)).  No results found.  Review of Systems  Unable to perform ROS: Dementia  Musculoskeletal: Positive for arthralgias (Right hip).   Blood pressure 116/60, pulse 81, temperature 98.5 F (36.9 C), temperature source Oral, resp. rate (!) 25, height 5\' 4"  (1.626 m), weight 64.4 kg, SpO2 99 %. Physical Exam  Constitutional: She appears well-developed and well-nourished. No distress.  HENT:  Head: Normocephalic and atraumatic.  Eyes: Conjunctivae are normal. Right eye exhibits no discharge. Left eye exhibits no discharge. No scleral icterus.  Cardiovascular: Normal rate and regular rhythm.  Respiratory: Effort normal. No respiratory distress.  Musculoskeletal:     Cervical back: Normal range of motion.     Comments: RLE No traumatic wounds, ecchymosis, or rash  Severe TTP  No knee or ankle effusion  Knee stable to varus/ valgus and anterior/posterior stress  Sens DPN, SPN, TN intact  Motor EHL, ext, flex, evers 5/5  DP 2+,  PT 2+, No significant edema  Neurological: She is alert.  Skin: Skin is warm and dry. She is not diaphoretic.  Psychiatric: She has a normal mood and affect. Her behavior is normal.    Assessment/Plan: Right hip fx -- Will plan IMN tomorrow with Dr. Doreatha Martin. Please keep NPO after MN. Multiple medical problems including malignant neoplasm of the upper left bronchus and lung, with centrilobular emphysema and acute on chronic combined systolic and diastolic CHF, and dementia -- Will ask hospitalists to admit, manage, and clear. Appreciate their help.    Lisette Abu, PA-C Orthopedic  Surgery [phone] 11/07/2019, 2:37 PM

## 2019-11-07 NOTE — Anesthesia Preprocedure Evaluation (Addendum)
Anesthesia Evaluation  Patient identified by MRN, date of birth, ID band Patient awake    Airway Mallampati: III  TM Distance: >3 FB Neck ROM: Full    Dental  (+) Edentulous Upper, Edentulous Lower   Pulmonary shortness of breath and with exertion, asthma , COPD,  COPD inhaler, former smoker,  Hx/o Lung Ca   Pulmonary exam normal breath sounds clear to auscultation       Cardiovascular hypertension, Pt. on medications and Pt. on home beta blockers +CHF  Normal cardiovascular exam Rhythm:Regular Rate:Normal  EKG- NSR LBBB pattern   Neuro/Psych Macular degeneration negative neurological ROS  negative psych ROS   GI/Hepatic negative GI ROS, Neg liver ROS,   Endo/Other  Hx/o left breast Ca  Renal/GU negative Renal ROS   Interstitial cystitis    Musculoskeletal  (+) Arthritis , Osteoarthritis,  Fx right hip   Abdominal   Peds  Hematology  (+) anemia ,   Anesthesia Other Findings   Reproductive/Obstetrics                           Anesthesia Physical Anesthesia Plan  ASA: IV  Anesthesia Plan: General   Post-op Pain Management:    Induction: Intravenous  PONV Risk Score and Plan: 4 or greater and Ondansetron, Treatment may vary due to age or medical condition and Propofol infusion  Airway Management Planned: Natural Airway, Simple Face Mask and Nasal Cannula  Additional Equipment:   Intra-op Plan:   Post-operative Plan:   Informed Consent: I have reviewed the patients History and Physical, chart, labs and discussed the procedure including the risks, benefits and alternatives for the proposed anesthesia with the patient or authorized representative who has indicated his/her understanding and acceptance.   Patient has DNR.  Suspend DNR and Discussed DNR with power of attorney.   Dental advisory given  Plan Discussed with: CRNA and Surgeon  Anesthesia Plan Comments:        Anesthesia Quick Evaluation

## 2019-11-07 NOTE — H&P (Addendum)
History and Physical    Sandra Ferguson YKD:[id number] DOB: 23-Oct-1926 DOA: 11/07/2019  Referring MD/NP/PA: Madalyn Rob, MD PCP: Merrilee Seashore, MD  Patient coming from: Home (lives with daughters) via EMS  Chief Complaint: Fall  I have personally briefly reviewed patient's old medical records in [place]   HPI: Sandra Ferguson is a 86 y.o. female with medical history significant of small cell lung cancer s/p lobectomy with recurrent, breast cancer, combined systolic and diastolic CHF, osteoporosis, dementia, and on hospice presents after having a unwitnessed fall with complaints of right leg.  The patient has no recollection of what occurred to cause a fall.  Possibly occurred as patient was getting out of bed.  She was unable to stand up or bear weight on the right side.  EMS was called to bring the patient into the hospital.    ED Course:  Upon admission into the emergency department patient was noted to be afebrile, respirations 17-27, and O2 saturation maintained on 2 L nasal cannula oxygen.  Labs appear unchanged from patient's baseline.  X-rays revealed a right comminuted intertrochanteric femur fracture. CXR significant for a left upper lobe mass.  Silvestre Gunner PA-C of orthopedics was consulted.  Review of Systems  Unable to perform ROS: Dementia  Musculoskeletal: Positive for falls and joint pain.  Endo/Heme/Allergies: Negative for environmental allergies.  Psychiatric/Behavioral: Positive for memory loss.    Past Medical History:  Diagnosis Date  . Arthritis   . Asthma    very little  . Breast cancer ([place])   . Breast cancer, left breast ([facility])    Invasive Carcinoma; 0/2 Nodes Negative; Er100%, PR 8 % K1-67 13%, Her-2New No amplification  . Diverticulosis   . DJD (degenerative joint disease)   . Full dentures   . Gastritis   . Interstitial cystitis   . Lung cancer [facility]) 2008   Small Cell LUng Cancer Right Upper Lobe  . Macular degeneration 2011  .  Osteoporosis   . Phlebitis 1970  . S/P radiation therapy 11/27/2012-12/25/2012   Left Breast / 40.05Gy in 15 fractions/Left Breast Boost / 10 Gy in 5 fractions  . Shortness of breath   . Status post chemotherapy 10/03/2006 - 12/05/2006   5 Cycles of Adjuvant Carboplatin/ VP-16 with Neulasta Support   . Wears glasses     Past Surgical History:  Procedure Laterality Date  . BREAST LUMPECTOMY WITH NEEDLE LOCALIZATION AND AXILLARY SENTINEL LYMPH NODE BX  08/21/2012   Procedure: BREAST LUMPECTOMY WITH NEEDLE LOCALIZATION AND AXILLARY SENTINEL LYMPH NODE BX;  Surgeon: Stark Klein, MD;  Location: [facility];  Service: General;  Laterality: Left;  . BREAST SURGERY    . CHOLECYSTECTOMY     laparoscopic  . EYE SURGERY     rt cataract  . right upper lobectomy  09/01/2006     reports that she quit smoking about 38 years ago. Her smokeless tobacco use includes snuff. She reports that she does not drink alcohol or use drugs.  Allergies  Allergen Reactions  . Morphine Sulfate Shortness Of Breath    Hallucinations and convulsions    Family History  Problem Relation Age of Onset  . Breast cancer Mother   . Breast cancer Sister        31 -sister (paternal side of family)  . Cancer Maternal Grandfather   . Breast cancer Maternal Grandmother   . Breast cancer Sister     Prior to Admission medications   Medication Sig Start Date  End Date Taking? Authorizing Provider  acetaminophen (TYLENOL) 325 MG tablet Take 2 tablets (650 mg total) by mouth every 4 (four) hours as needed for headache or mild pain. 06/06/19   Sheikh, Omair Latif, DO  ALPRAZolam Duanne Moron) 0.5 MG tablet Take 0.25-0.5 mg by mouth 2 (two) times daily as needed for anxiety.  10/27/10   [provider]  aspirin 81 MG tablet Take 81 mg by mouth daily as needed for pain.     [provider]  carvedilol (COREG) 3.125 MG tablet Take 1 tablet (3.125 mg total) by mouth 2 (two) times daily with a meal. 06/06/19    Sheikh, Omair Latif, DO  furosemide (LASIX) 20 MG tablet Take 1 tablet (20 mg total) by mouth daily. 06/06/19 06/05/20  Raiford Noble Latif, DO  lisinopril (ZESTRIL) 2.5 MG tablet Take 1 tablet (2.5 mg total) by mouth daily. 06/07/19   Sheikh, Georgina Quint Latif, DO  polyethylene glycol (MIRALAX / GLYCOLAX) 17 g packet Take 17 g by mouth daily. 06/06/19   Sheikh, Omair Latif, DO  senna-docusate (SENOKOT-S) 8.6-50 MG tablet Take 1 tablet by mouth at bedtime. 06/06/19   Kerney Elbe, DO    Physical Exam:  Constitutional: Elderly female who appears to be in acute discomfort Vitals:   11/07/19 1321 11/07/19 1322 11/07/19 1334 11/07/19 1505  BP: 118/80  116/60 107/61  Pulse: 84  81 79  Resp: 17  (!) 25 (!) 21  Temp: 98.5 F (36.9 C)     TempSrc: Oral     SpO2: 98%  99% 99%  Weight:  64.4 kg    Height:  5\' 4"  (1.626 m)     Eyes: PERRL, lids and conjunctivae normal ENMT: Mucous membranes are moist. Posterior pharynx clear of any exudate or lesions.  Neck: normal, supple, no masses, no thyromegaly Respiratory: clear to auscultation bilaterally, no wheezing, no crackles. Normal respiratory effort. No accessory muscle use.  Cardiovascular: Regular rate and rhythm, no murmurs / rubs / gallops. No extremity edema. 2+ pedal pulses. No carotid bruits.  Abdomen: no tenderness, no masses palpated. No hepatosplenomegaly. Bowel sounds positive.  Musculoskeletal: no clubbing / cyanosis.  Right leg externally Skin: no rashes, lesions, ulcers. No induration Neurologic: CN 2-12 grossly intact. Sensation intact, DTR normal. Strength 5/5 in all 4.  Psychiatric: Poor recent memory. Alert and oriented to person.  Anxious mood.    Labs on Admission: I have personally reviewed following labs and imaging studies  CBC: No results for input(s): WBC, NEUTROABS, HGB, HCT, MCV, PLT in the last 168 hours. Basic Metabolic Panel: No results for input(s): NA, K, CL, CO2, GLUCOSE, BUN, CREATININE, CALCIUM, MG, PHOS in  the last 168 hours. GFR: CrCl cannot be calculated (Patient's most recent lab result is older than the maximum 21 days allowed.). Liver Function Tests: No results for input(s): AST, ALT, ALKPHOS, BILITOT, PROT, ALBUMIN in the last 168 hours. No results for input(s): LIPASE, AMYLASE in the last 168 hours. No results for input(s): AMMONIA in the last 168 hours. Coagulation Profile: No results for input(s): INR, PROTIME in the last 168 hours. Cardiac Enzymes: No results for input(s): CKTOTAL, CKMB, CKMBINDEX, TROPONINI in the last 168 hours. BNP (last 3 results) No results for input(s): PROBNP in the last 8760 hours. HbA1C: No results for input(s): HGBA1C in the last 72 hours. CBG: No results for input(s): GLUCAP in the last 168 hours. Lipid Profile: No results for input(s): CHOL, HDL, LDLCALC, TRIG, CHOLHDL, LDLDIRECT in the last 72 hours. Thyroid  Function Tests: No results for input(s): TSH, T4TOTAL, FREET4, T3FREE, THYROIDAB in the last 72 hours. Anemia Panel: No results for input(s): VITAMINB12, FOLATE, FERRITIN, TIBC, IRON, RETICCTPCT in the last 72 hours. Urine analysis:    Component Value Date/Time   COLORURINE YELLOW 06/06/2019 1433   APPEARANCEUR CLEAR 06/06/2019 1433   LABSPEC 1.015 06/06/2019 1433   LABSPEC 1.005 12/31/2012 1505   PHURINE 7.0 06/06/2019 1433   GLUCOSEU NEGATIVE 06/06/2019 1433   GLUCOSEU Negative 12/31/2012 1505   HGBUR NEGATIVE 06/06/2019 1433   BILIRUBINUR NEGATIVE 06/06/2019 1433   BILIRUBINUR Negative 12/31/2012 1505   KETONESUR NEGATIVE 06/06/2019 1433   PROTEINUR NEGATIVE 06/06/2019 1433   UROBILINOGEN 0.2 12/31/2012 1505   NITRITE POSITIVE (A) 06/06/2019 1433   LEUKOCYTESUR MODERATE (A) 06/06/2019 1433   LEUKOCYTESUR Moderate 12/31/2012 1505   Sepsis Labs: No results found for this or any previous visit (from the past 240 hour(s)).   Radiological Exams on Admission: DG Chest 1 View  Result Date: 11/07/2019 CLINICAL DATA:  Pain following  fall EXAM: CHEST  1 VIEW COMPARISON:  June 05, 2019 chest radiograph and chest CT June 03, 2019 FINDINGS: There is stable elevation of the right hemidiaphragm. The opacity in the left upper lobe which appears masslike on prior CT is again noted, currently measuring 5.1 x 4.3 cm, larger than on previous studies. There is atelectatic change in each lower lung region. Heart size and pulmonary vascular normal. No adenopathy. There is aortic atherosclerosis. No adenopathy appreciable. No pneumothorax. No bone lesions evident. IMPRESSION: 1. Apparent mass in left upper lobe, larger than on previous study. Neoplasm in this area is suspected. 2. Stable elevation the right hemidiaphragm. Areas of lower lobe region atelectatic change bilaterally. 3.  Heart size normal. 4.  No evident adenopathy. 5.  Aortic Atherosclerosis (ICD10-I70.0). Electronically Signed   By: Lowella Grip III M.D.   On: 11/07/2019 14:43   CT Head Wo Contrast  Result Date: 11/07/2019 CLINICAL DATA:  Fall with trauma to the head. EXAM: CT HEAD WITHOUT CONTRAST TECHNIQUE: Contiguous axial images were obtained from the base of the skull through the vertex without intravenous contrast. COMPARISON:  11/07/2011.  12/07/2016. FINDINGS: Brain: Age related atrophy. Chronic small-vessel ischemic changes of the hemispheric white matter. Old lacunar infarction left basal ganglia/anterior limb internal capsule, lateral left thalamus/posterior limb internal capsule and right external capsule. No mass, hemorrhage, hydrocephalus or extra-axial collection. Vascular: There is atherosclerotic calcification of the major vessels at the base of the brain. Skull: No skull fracture Sinuses/Orbits: Sinuses are clear. Old medial orbital blowout fracture on the left. Other: None IMPRESSION: No acute or traumatic finding. Atrophy and chronic small-vessel ischemic changes, progressive since 2018. Electronically Signed   By: Nelson Chimes M.D.   On: 11/07/2019 14:43    DG Hip Unilat W or Wo Pelvis 2-3 Views Right  Result Date: 11/07/2019 CLINICAL DATA:  Pain following fall EXAM: DG HIP (WITH OR WITHOUT PELVIS) 2-3V RIGHT COMPARISON:  None. FINDINGS: Frontal pelvis as well as frontal and lateral right hip images were obtained. There is a comminuted intertrochanteric femur fracture on the right with varus angulation at the fracture site. There is avulsion of the lesser trochanter and avulsion of a portion of the greater trochanter on the right. No other fracture. No dislocation. Bones are diffusely osteoporotic. There is mild symmetric narrowing of each hip joint as well as degenerative change in the lower lumbar spine. IMPRESSION: 1. Comminuted intertrochanteric femur fracture on the right with varus angulation at  the fracture site. 2.  Diffuse osteoporosis. 3.  No dislocation. 4. Symmetric narrowing of each hip joint. Degenerative change noted in the visualized lower lumbar region. Electronically Signed   By: Lowella Grip III M.D.   On: 11/07/2019 14:48    EKG: Independently reviewed.  Sinus rhythm at 79 bpm with LBBB  Assessment/Plan Right intertrochanteric femur fracture secondary to fall: Acute.  Patient had an unwitnessed fall and to have a right intertrochanteric femur fracture.  Orthopedics consulted.  Patient overall seems to be a poor surgical candidate.  Probability of perioperative myocardial infarction or cardiac arrest utilizing the geriatric sensitive risk assessment score is 4.8%. -Admit to a medical telemetry bed -Hip fracture order set utilized -N.p.o. after midnight -Pain meds as needed -Appreciate orthopedic consultative services, we will follow-up for further recommendations  Metastatic small cell lung cancer on hospice: Patient has had history of enlarging left upper lobe mass with hilar mass.  Suspected to be recurrent small cell lung cancer.  Evaluated for possible radiation therapy, but patient had declined.  She is currently on  hospice. -Continue hospice outpatient services  Combined systolic and diastolic congestive heart failure: Last EF noted to be 20 to 25% with grade 1 diastolic dysfunction by echocardiogram on 06/2019.  Patient appears to be euvolemic at this time. -Strict intake and output -Daily weights -Fluid restriction of 1500 mL fluid daily  Normocytic anemia: Chronic.  Hemoglobin 11 g/dL which appears near patient's baseline. -Continue to monitor  History of breast cancer  Osteoporosis: Chronic.  Dementia: Patient with short-term memory loss.  DNR: Present on admission  DVT prophylaxis: Lovenox Code Status: DNR Family Communication: Daughter updated at bedside Disposition Plan: To be determined Consults called: Orthopedic Admission status: Inpatient   Norval Morton MD [facility] Pager [phone]   If 7PM-7AM, please contact night-coverage [email or web address] Password [facility]  11/07/2019, 3:14 PM

## 2019-11-07 NOTE — Progress Notes (Signed)
[facility]: [facility]  Ms. Budzik is a current pt under hospice care with [facility]. Her primary diagnosis under hospice care is: malignant neoplasm of the upper left bronchus and lung, with centrilobular emphysema and acute on chronic combined systolic and diastolic CHF.   She was transferred to the ED after the daughter called EMS due to pt fall at home.  Her primary caregiver is the daughter Leavy Cella [phone] and daughter Diana Eves [phone] She was a DNR in home (unknown if this was transported with her) CHF EF 20%. Family choose conservative measure for this at the dime of diagnosis last hospitalization.  Please contact hospital liaison for any concerns. Webb Silversmith RN [phone]

## 2019-11-07 NOTE — ED Triage Notes (Signed)
Pt BIB [facility]. Pt reports falling after getting out of bed this A.M. Pt reports severe pelvic pain on right side and has small laceration to right side of head. Pt denies taking blood thinners. Pt also reports currently being on hospice care r/t to lung cancer.

## 2019-11-07 NOTE — Consult Note (Signed)
Reason for Consult:Hip fx Referring Physician: R Izabellah Sandra Ferguson is an 86 y.o. female.  HPI: Sandra Ferguson was at home and fell. She had immediate right hip pain and could not get up. She was brought to the ED where x-rays showed a right hip fracture and orthopedic surgery was consulted. She lives with family and hospice is treating her at home.  Past Medical History:  Diagnosis Date  . Arthritis   . Asthma    very little  . Breast cancer ([place])   . Breast cancer, left breast ([facility])    Invasive Carcinoma; 0/2 Nodes Negative; Er100%, PR 8 % K1-67 13%, Her-2New No amplification  . Diverticulosis   . DJD (degenerative joint disease)   . Full dentures   . Gastritis   . Interstitial cystitis   . Lung cancer [facility]) 2008   Small Cell LUng Cancer Right Upper Lobe  . Macular degeneration 2011  . Osteoporosis   . Phlebitis 1970  . S/P radiation therapy 11/27/2012-12/25/2012   Left Breast / 40.05Gy in 15 fractions/Left Breast Boost / 10 Gy in 5 fractions  . Shortness of breath   . Status post chemotherapy 10/03/2006 - 12/05/2006   5 Cycles of Adjuvant Carboplatin/ VP-16 with Neulasta Support   . Wears glasses     Past Surgical History:  Procedure Laterality Date  . BREAST LUMPECTOMY WITH NEEDLE LOCALIZATION AND AXILLARY SENTINEL LYMPH NODE BX  08/21/2012   Procedure: BREAST LUMPECTOMY WITH NEEDLE LOCALIZATION AND AXILLARY SENTINEL LYMPH NODE BX;  Surgeon: Stark Klein, MD;  Location: [facility];  Service: General;  Laterality: Left;  . BREAST SURGERY    . CHOLECYSTECTOMY     laparoscopic  . EYE SURGERY     rt cataract  . right upper lobectomy  09/01/2006    Family History  Problem Relation Age of Onset  . Breast cancer Mother   . Breast cancer Sister        65 -sister (paternal side of family)  . Cancer Maternal Grandfather   . Breast cancer Maternal Grandmother   . Breast cancer Sister     Social History:  reports that she quit smoking about 38 years ago. Her  smokeless tobacco use includes snuff. She reports that she does not drink alcohol or use drugs.  Allergies:  Allergies  Allergen Reactions  . Morphine Sulfate Shortness Of Breath    Hallucinations and convulsions    Medications: I have reviewed the patient's current medications.  No results found for this or any previous visit (from the past 48 hour(s)).  No results found.  Review of Systems  Unable to perform ROS: Dementia  Musculoskeletal: Positive for arthralgias (Right hip).   Blood pressure 116/60, pulse 81, temperature 98.5 F (36.9 C), temperature source Oral, resp. rate (!) 25, height 5\' 4"  (1.626 m), weight 64.4 kg, SpO2 99 %. Physical Exam  Constitutional: She appears well-developed and well-nourished. No distress.  HENT:  Head: Normocephalic and atraumatic.  Eyes: Conjunctivae are normal. Right eye exhibits no discharge. Left eye exhibits no discharge. No scleral icterus.  Cardiovascular: Normal rate and regular rhythm.  Respiratory: Effort normal. No respiratory distress.  Musculoskeletal:     Cervical back: Normal range of motion.     Comments: RLE No traumatic wounds, ecchymosis, or rash  Severe TTP  No knee or ankle effusion  Knee stable to varus/ valgus and anterior/posterior stress  Sens DPN, SPN, TN intact  Motor EHL, ext, flex, evers 5/5  DP 2+,  PT 2+, No significant edema  Neurological: She is alert.  Skin: Skin is warm and dry. She is not diaphoretic.  Psychiatric: She has a normal mood and affect. Her behavior is normal.    Assessment/Plan: Right hip fx -- Will plan IMN tomorrow with Dr. Doreatha Martin. Please keep NPO after MN. Multiple medical problems including malignant neoplasm of the upper left bronchus and lung, with centrilobular emphysema and acute on chronic combined systolic and diastolic CHF, and dementia -- Will ask hospitalists to admit, manage, and clear. Appreciate their help.    Lisette Abu, PA-C Orthopedic  Surgery [phone] 11/07/2019, 2:37 PM

## 2019-11-08 ENCOUNTER — Inpatient Hospital Stay (HOSPITAL_COMMUNITY)

## 2019-11-08 ENCOUNTER — Inpatient Hospital Stay (HOSPITAL_COMMUNITY): Admitting: Anesthesiology

## 2019-11-08 ENCOUNTER — Encounter (HOSPITAL_COMMUNITY)
Admission: EM | Disposition: A | Discharge status: Hospice | Payer: Self-pay | Source: Home / Self Care | Attending: Family Medicine

## 2019-11-08 ENCOUNTER — Other Ambulatory Visit: Payer: Self-pay

## 2019-11-08 DIAGNOSIS — S72001A Fracture of unspecified part of neck of right femur, initial encounter for closed fracture: Secondary | ICD-10-CM

## 2019-11-08 HISTORY — PX: INTRAMEDULLARY (IM) NAIL INTERTROCHANTERIC: SHX5875

## 2019-11-08 LAB — CBC
HCT: 33.3 % — ABNORMAL LOW (ref 36.0–46.0)
Hemoglobin: 11 g/dL — ABNORMAL LOW (ref 12.0–15.0)
MCH: 31.2 pg (ref 26.0–34.0)
MCHC: 33 g/dL (ref 30.0–36.0)
MCV: 94.3 fL (ref 80.0–100.0)
Platelets: 185 10*3/uL (ref 150–400)
RBC: 3.53 MIL/uL — ABNORMAL LOW (ref 3.87–5.11)
RDW: 13 % (ref 11.5–15.5)
WBC: 6.5 10*3/uL (ref 4.0–10.5)
nRBC: 0 % (ref 0.0–0.2)

## 2019-11-08 LAB — SURGICAL PCR SCREEN
MRSA, PCR: NEGATIVE
Staphylococcus aureus: NEGATIVE

## 2019-11-08 SURGERY — FIXATION, FRACTURE, INTERTROCHANTERIC, WITH INTRAMEDULLARY ROD
Anesthesia: General | Site: Leg Upper | Laterality: Right

## 2019-11-08 MED ORDER — ALBUMIN HUMAN 5 % IV SOLN
INTRAVENOUS | Status: AC
  Filled 2019-11-08: qty 250

## 2019-11-08 MED ORDER — METHOCARBAMOL 1000 MG/10ML IJ SOLN
500.0000 mg | Freq: Four times a day (QID) | INTRAVENOUS | Status: DC | PRN
  Filled 2019-11-08: qty 5

## 2019-11-08 MED ORDER — EPHEDRINE SULFATE-NACL 50-0.9 MG/10ML-% IV SOSY
PREFILLED_SYRINGE | INTRAVENOUS | Status: DC | PRN
  Administered 2019-11-08: 5 mg via INTRAVENOUS

## 2019-11-08 MED ORDER — FENTANYL CITRATE (PF) 100 MCG/2ML IJ SOLN
INTRAMUSCULAR | Status: DC | PRN
  Administered 2019-11-08: 50 ug via INTRAVENOUS

## 2019-11-08 MED ORDER — METOCLOPRAMIDE HCL 5 MG PO TABS: 5.0000 mg | ORAL_TABLET | Freq: Three times a day (TID) | ORAL | Status: DC | PRN

## 2019-11-08 MED ORDER — PHENOL 1.4 % MT LIQD: 1.0000 | OROMUCOSAL | Status: DC | PRN

## 2019-11-08 MED ORDER — ALBUTEROL SULFATE (2.5 MG/3ML) 0.083% IN NEBU
2.5000 mg | INHALATION_SOLUTION | Freq: Four times a day (QID) | RESPIRATORY_TRACT | Status: DC | PRN
  Administered 2019-11-08: 2.5 mg via RESPIRATORY_TRACT

## 2019-11-08 MED ORDER — CEFAZOLIN SODIUM-DEXTROSE 2-3 GM-%(50ML) IV SOLR
INTRAVENOUS | Status: DC | PRN
  Administered 2019-11-08: 2 g via INTRAVENOUS

## 2019-11-08 MED ORDER — LIDOCAINE 2% (20 MG/ML) 5 ML SYRINGE
INTRAMUSCULAR | Status: AC
  Filled 2019-11-08: qty 5

## 2019-11-08 MED ORDER — ONDANSETRON HCL 4 MG/2ML IJ SOLN
4.0000 mg | Freq: Four times a day (QID) | INTRAMUSCULAR | Status: DC | PRN
  Filled 2019-11-08: qty 2

## 2019-11-08 MED ORDER — MENTHOL 3 MG MT LOZG: 1.0000 | LOZENGE | OROMUCOSAL | Status: DC | PRN

## 2019-11-08 MED ORDER — CHLORHEXIDINE GLUCONATE 4 % EX LIQD
60.0000 mL | Freq: Once | CUTANEOUS | Status: AC
  Administered 2019-11-08: 4 via TOPICAL
  Filled 2019-11-08: qty 60

## 2019-11-08 MED ORDER — ALBUMIN HUMAN 5 % IV SOLN
12.5000 g | Freq: Once | INTRAVENOUS | Status: AC
  Administered 2019-11-08: 12.5 g via INTRAVENOUS

## 2019-11-08 MED ORDER — DOCUSATE SODIUM 100 MG PO CAPS
100.0000 mg | ORAL_CAPSULE | Freq: Two times a day (BID) | ORAL | Status: DC
  Administered 2019-11-08 – 2019-11-11 (×6): 100 mg via ORAL
  Filled 2019-11-08 (×6): qty 1

## 2019-11-08 MED ORDER — PROPOFOL 10 MG/ML IV BOLUS
INTRAVENOUS | Status: AC
  Filled 2019-11-08: qty 20

## 2019-11-08 MED ORDER — HYDROMORPHONE HCL 2 MG PO TABS: 1.0000 mg | ORAL_TABLET | ORAL | Status: DC | PRN

## 2019-11-08 MED ORDER — CEFAZOLIN SODIUM-DEXTROSE 2-4 GM/100ML-% IV SOLN: 2.0000 g | INTRAVENOUS | Status: DC

## 2019-11-08 MED ORDER — PROPOFOL 500 MG/50ML IV EMUL
INTRAVENOUS | Status: DC | PRN
  Administered 2019-11-08: 15 ug/kg/min via INTRAVENOUS

## 2019-11-08 MED ORDER — MIDAZOLAM HCL 2 MG/2ML IJ SOLN
INTRAMUSCULAR | Status: AC
  Filled 2019-11-08: qty 2

## 2019-11-08 MED ORDER — ACETAMINOPHEN 325 MG PO TABS
650.0000 mg | ORAL_TABLET | Freq: Four times a day (QID) | ORAL | Status: DC
  Administered 2019-11-08 – 2019-11-11 (×13): 650 mg via ORAL
  Filled 2019-11-08 (×12): qty 2

## 2019-11-08 MED ORDER — ONDANSETRON HCL 4 MG/2ML IJ SOLN: 4.0000 mg | Freq: Once | INTRAMUSCULAR | Status: DC | PRN

## 2019-11-08 MED ORDER — VANCOMYCIN HCL 1000 MG IV SOLR: INTRAVENOUS | Status: DC | PRN

## 2019-11-08 MED ORDER — ENOXAPARIN SODIUM 40 MG/0.4ML ~~LOC~~ SOLN
40.0000 mg | SUBCUTANEOUS | Status: DC
  Administered 2019-11-09 – 2019-11-11 (×3): 40 mg via SUBCUTANEOUS
  Filled 2019-11-08 (×3): qty 0.4

## 2019-11-08 MED ORDER — VANCOMYCIN HCL 1000 MG IV SOLR
INTRAVENOUS | Status: AC
  Filled 2019-11-08: qty 1000

## 2019-11-08 MED ORDER — FENTANYL CITRATE (PF) 100 MCG/2ML IJ SOLN: 25.0000 ug | INTRAMUSCULAR | Status: DC | PRN

## 2019-11-08 MED ORDER — PROPOFOL 10 MG/ML IV BOLUS
INTRAVENOUS | Status: DC | PRN
  Administered 2019-11-08: 30 mg via INTRAVENOUS

## 2019-11-08 MED ORDER — 0.9 % SODIUM CHLORIDE (POUR BTL) OPTIME
TOPICAL | Status: DC | PRN
  Administered 2019-11-08: 1000 mL

## 2019-11-08 MED ORDER — PHENYLEPHRINE HCL-NACL 10-0.9 MG/250ML-% IV SOLN
INTRAVENOUS | Status: DC | PRN
  Administered 2019-11-08: 50 ug/min via INTRAVENOUS

## 2019-11-08 MED ORDER — CEFAZOLIN SODIUM-DEXTROSE 2-4 GM/100ML-% IV SOLN
2.0000 g | Freq: Three times a day (TID) | INTRAVENOUS | Status: AC
  Administered 2019-11-08 – 2019-11-09 (×3): 2 g via INTRAVENOUS
  Filled 2019-11-08 (×3): qty 100

## 2019-11-08 MED ORDER — CHLORHEXIDINE GLUCONATE 4 % EX LIQD
CUTANEOUS | Status: AC
  Administered 2019-11-08: 4
  Filled 2019-11-08: qty 15

## 2019-11-08 MED ORDER — POVIDONE-IODINE 10 % EX SWAB
2.0000 "application " | Freq: Once | CUTANEOUS | Status: AC
  Administered 2019-11-08: 2 via TOPICAL

## 2019-11-08 MED ORDER — LACTATED RINGERS IV SOLN: INTRAVENOUS | Status: DC | PRN

## 2019-11-08 MED ORDER — ROCURONIUM BROMIDE 10 MG/ML (PF) SYRINGE
PREFILLED_SYRINGE | INTRAVENOUS | Status: AC
  Filled 2019-11-08: qty 10

## 2019-11-08 MED ORDER — ALBUTEROL SULFATE (2.5 MG/3ML) 0.083% IN NEBU
INHALATION_SOLUTION | RESPIRATORY_TRACT | Status: AC
  Filled 2019-11-08: qty 3

## 2019-11-08 MED ORDER — METOCLOPRAMIDE HCL 5 MG/ML IJ SOLN: 5.0000 mg | Freq: Three times a day (TID) | INTRAMUSCULAR | Status: DC | PRN

## 2019-11-08 MED ORDER — ONDANSETRON HCL 4 MG PO TABS: 4.0000 mg | ORAL_TABLET | Freq: Four times a day (QID) | ORAL | Status: DC | PRN

## 2019-11-08 MED ORDER — ENSURE PRE-SURGERY PO LIQD
296.0000 mL | Freq: Once | ORAL | Status: AC
  Administered 2019-11-08: 296 mL via ORAL
  Filled 2019-11-08: qty 296

## 2019-11-08 MED ORDER — CEFAZOLIN SODIUM-DEXTROSE 1-4 GM/50ML-% IV SOLN
INTRAVENOUS | Status: AC
  Filled 2019-11-08: qty 100

## 2019-11-08 MED ORDER — FENTANYL CITRATE (PF) 250 MCG/5ML IJ SOLN
INTRAMUSCULAR | Status: AC
  Filled 2019-11-08: qty 5

## 2019-11-08 MED ORDER — HALOPERIDOL 0.5 MG PO TABS
0.5000 mg | ORAL_TABLET | Freq: Two times a day (BID) | ORAL | Status: DC | PRN
  Filled 2019-11-08: qty 1

## 2019-11-08 MED ORDER — LIDOCAINE 2% (20 MG/ML) 5 ML SYRINGE
INTRAMUSCULAR | Status: DC | PRN
  Administered 2019-11-08: 100 mg via INTRAVENOUS

## 2019-11-08 MED ORDER — METHOCARBAMOL 500 MG PO TABS
500.0000 mg | ORAL_TABLET | Freq: Four times a day (QID) | ORAL | Status: DC | PRN
  Administered 2019-11-08 – 2019-11-10 (×5): 500 mg via ORAL
  Filled 2019-11-08 (×5): qty 1

## 2019-11-08 MED ORDER — BUPIVACAINE IN DEXTROSE 0.75-8.25 % IT SOLN
INTRATHECAL | Status: DC | PRN
  Administered 2019-11-08: 1.6 mL via INTRATHECAL

## 2019-11-08 SURGICAL SUPPLY — 49 items
ADH SKN CLS APL DERMABOND .7 (GAUZE/BANDAGES/DRESSINGS) ×1
APL PRP STRL LF DISP 70% ISPRP (MISCELLANEOUS) ×1
BIT DRILL LONG 4.0 (BIT) IMPLANT
BRUSH SCRUB EZ PLAIN DRY (MISCELLANEOUS) ×6 IMPLANT
CHLORAPREP W/TINT 26 (MISCELLANEOUS) ×3 IMPLANT
COVER PERINEAL POST (MISCELLANEOUS) ×3 IMPLANT
COVER SURGICAL LIGHT HANDLE (MISCELLANEOUS) ×3 IMPLANT
COVER WAND RF STERILE (DRAPES) IMPLANT
DERMABOND ADVANCED (GAUZE/BANDAGES/DRESSINGS) ×2
DERMABOND ADVANCED .7 DNX12 (GAUZE/BANDAGES/DRESSINGS) ×1 IMPLANT
DRAPE C-ARM 35X43 STRL (DRAPES) ×3 IMPLANT
DRAPE IMP U-DRAPE 54X76 (DRAPES) ×6 IMPLANT
DRAPE INCISE IOBAN 66X45 STRL (DRAPES) ×3 IMPLANT
DRAPE STERI IOBAN 125X83 (DRAPES) ×3 IMPLANT
DRAPE SURG 17X23 STRL (DRAPES) ×6 IMPLANT
DRAPE U-SHAPE 47X51 STRL (DRAPES) ×3 IMPLANT
DRILL BIT LONG 4.0 (BIT) ×3
DRSG MEPILEX BORDER 4X4 (GAUZE/BANDAGES/DRESSINGS) ×3 IMPLANT
DRSG MEPILEX BORDER 4X8 (GAUZE/BANDAGES/DRESSINGS) ×3 IMPLANT
ELECT REM PT RETURN 9FT ADLT (ELECTROSURGICAL) ×3
ELECTRODE REM PT RTRN 9FT ADLT (ELECTROSURGICAL) ×1 IMPLANT
GLOVE BIO SURGEON STRL SZ 6.5 (GLOVE) ×6 IMPLANT
GLOVE BIO SURGEON STRL SZ7.5 (GLOVE) ×12 IMPLANT
GLOVE BIO SURGEONS STRL SZ 6.5 (GLOVE) ×3
GLOVE BIOGEL PI IND STRL 6.5 (GLOVE) ×1 IMPLANT
GLOVE BIOGEL PI IND STRL 7.5 (GLOVE) ×1 IMPLANT
GLOVE BIOGEL PI INDICATOR 6.5 (GLOVE) ×2
GLOVE BIOGEL PI INDICATOR 7.5 (GLOVE) ×2
GOWN STRL REUS W/ TWL LRG LVL3 (GOWN DISPOSABLE) ×1 IMPLANT
GOWN STRL REUS W/TWL LRG LVL3 (GOWN DISPOSABLE) ×3
GUIDE PIN 3.2X343 (PIN) ×2
GUIDE PIN 3.2X343MM (PIN) ×6
KIT BASIN OR (CUSTOM PROCEDURE TRAY) ×3 IMPLANT
KIT TURNOVER KIT B (KITS) ×3 IMPLANT
MANIFOLD NEPTUNE II (INSTRUMENTS) ×3 IMPLANT
NAIL INTERTAN 10X18 130D 10S (Nail) ×2 IMPLANT
NS IRRIG 1000ML POUR BTL (IV SOLUTION) ×3 IMPLANT
PACK GENERAL/GYN (CUSTOM PROCEDURE TRAY) ×3 IMPLANT
PAD ARMBOARD 7.5X6 YLW CONV (MISCELLANEOUS) ×6 IMPLANT
PIN GUIDE 3.2X343MM (PIN) IMPLANT
SCREW LAG COMPR KIT 100/95 (Screw) ×2 IMPLANT
SCREW TRIGEN LOW PROF 5.0X35 (Screw) ×2 IMPLANT
SUT MNCRL AB 3-0 PS2 18 (SUTURE) ×3 IMPLANT
SUT VIC AB 0 CT1 27 (SUTURE)
SUT VIC AB 0 CT1 27XBRD ANBCTR (SUTURE) IMPLANT
SUT VIC AB 2-0 CT1 27 (SUTURE) ×6
SUT VIC AB 2-0 CT1 TAPERPNT 27 (SUTURE) ×2 IMPLANT
TOWEL GREEN STERILE (TOWEL DISPOSABLE) ×6 IMPLANT
WATER STERILE IRR 1000ML POUR (IV SOLUTION) ×3 IMPLANT

## 2019-11-08 NOTE — Op Note (Signed)
Orthopaedic Surgery Operative Note (CSN: [id number] ) Date of Surgery: 11/08/2019  Admit Date: 11/07/2019   Diagnoses: Pre-Op Diagnoses: Right intertrochanteric femur fracture   Post-Op Diagnosis: Same  Procedures: CPT 27245-Cephalomedullary nailing of right intertrochanteric femur fracture  Surgeons : Primary: Shona Needles, MD  Assistant: None  Location: OR 6   Anesthesia:Spinal with MAC  Antibiotics: Ancef 2g preop   Tourniquet time:None    Estimated Blood BOFB:510 mL  Complications:None   Specimens:None   Implants: Implant Name Type Inv. Item Serial No. Manufacturer Lot No. LRB No. Used Action  NAIL INTERTAN 10X18 130D 10S - [id number] Nail NAIL INTERTAN 10X18 130D 10S  [employer] [id number] Right 1 Implanted  SCREW LAG TRIGEN INTERLOCK - [id number] Screw SCREW LAG TRIGEN INTERLOCK  [employer] [id number] Right 1 Implanted  SCREW TRIGEN LOW PROF 5.0X35 - [id number] Screw SCREW TRIGEN LOW PROF 5.0X35  [employer] [id number] Right 1 Implanted     Indications for Surgery: 86 year old female who is on hospice for lung cancer who sustained a ground-level fall and had a right intertrochanteric femur fracture.  Due to the severe instability of her fracture I recommend cephalomedullary nailing of her right intertrochanteric femur fracture.  Risks and benefits were discussed with the patient and her daughters.  Risks included but not limited to bleeding, infection, malunion, nonunion, hardware failure, hardware irritation, nerve and blood vessel injury, continued pain, diminished ambulatory status, DVT, even the possibility anesthetic complications.  The patient and the daughters agreed to proceed with surgery and consent was obtained.  Operative Findings: Cephalomedullary nailing of right intertrochanteric femur fracture using [employer] InterTAN 10 x 180 mm short nail with 100 mm lag screw with a 95 mm compression  screw.  Procedure: The patient was identified in the preoperative holding area. Consent was confirmed with the patient and their family and all questions were answered. The operative extremity was marked after confirmation with the patient. she was then brought back to the operating room by our anesthesia colleagues.  She was placed under sedation and underwent spinal anesthetic.  She was then carefully transferred over to a Hana table.  Her legs were carefully positioned in the table.  Her right arm was placed over her chest all bony prominences were well-padded.  Fluoroscopic imaging was obtained and reduction maneuver was performed.  I was able to get adequate reduction of the intertrochanteric femur and then the right lower extremity was prepped and draped in usual sterile fashion.  A timeout was performed to verify the patient, the procedure, and the extremity.  Preoperative antibiotics were dosed.  I marked out an incision proximal to the greater trochanter.  I carried it down through skin and subcutaneous tissue.  I split the gluteus fascia in line with my incision.  I then used a threaded guidewire to enter the proximal metaphysis through the greater trochanter.  There is significant comminution of the trochanter and the guidepin was able to be positioned appropriately into the canal of the femur.  I then used an entry reamer after confirming with fluoroscopy the position of the guidewire.  I then proceeded to place the 10 x 180 mm short nail down the center of the canal.  I aligned it appropriately on the AP view and then made a percutaneous incision laterally to use the targeting device to place a threaded guidewire into the femoral head neck segment.  I confirmed adequate tip apex distance using AP and lateral fluoroscopic imaging.  I measured the length and chose 100 mm lag screw with a 95 mm compression screw.  The path for the compression screw was drilled and a antirotation bar was placed.  I  then drilled the lag screw path and proceeded to place the lag screw obtaining excellent purchase.  I then placed the compression screw and was able to compress the fracture approximately 5 mm.  Excellent reduction was obtained and restoration of the calcar was visualized.  I then statically locked the nail.  I then used the targeting guide to place a bicortical distal interlocking screw.  Final fluoroscopic imaging was obtained after the targeting device was removed.  The incisions were copiously irrigated.  The incisions were closed with 2-0 Vicryl and 3-0 Monocryl.  They were sealed with Dermabond.  Sterile dressings were placed.  The patient was carefully transition to regular bed and awoken from anesthesia and taken to the PACU in stable condition.  Post Op Plan/Instructions: Patient will be weightbearing as tolerated to the right lower extremity.  She will receive Lovenox for DVT prophylaxis while she is in the hospital.  She will be discharged home on aspirin 325 mg twice daily.  She will receive postoperative Ancef.  She will mobilize with physical and Occupational Therapy.  I was present and performed the entire surgery.  Katha Hamming, MD [facility]

## 2019-11-08 NOTE — Anesthesia Procedure Notes (Signed)
Spinal  Patient location during procedure: OR Start time: 11/08/2019 7:40 AM End time: 11/08/2019 8:44 AM Staffing Anesthesiologist: Josephine Igo, MD Preanesthetic Checklist Completed: patient identified, IV checked, site marked, risks and benefits discussed, surgical consent, monitors and equipment checked, pre-op evaluation and timeout performed Spinal Block Patient position: sitting Prep: DuraPrep and site prepped and draped Patient monitoring: heart rate, cardiac monitor, continuous pulse ox and blood pressure Approach: midline Location: L3-4 Injection technique: single-shot Needle Needle type: Pencan  Needle gauge: 24 G Needle length: 9 cm Needle insertion depth: 6 cm Assessment Sensory level: T4 Additional Notes Patient tolerated procedure well. Adequate sensory level.

## 2019-11-08 NOTE — Anesthesia Postprocedure Evaluation (Signed)
Anesthesia Post Note  Patient: Sandra Ferguson  Procedure(s) Performed: INTRAMEDULLARY (IM) NAIL INTERTROCHANTRIC (Right Leg Upper)     Patient location during evaluation: PACU Anesthesia Type: General Level of consciousness: awake and alert Pain management: pain level controlled Vital Signs Assessment: post-procedure vital signs reviewed and stable Respiratory status: spontaneous breathing, nonlabored ventilation, respiratory function stable and patient connected to nasal cannula oxygen Cardiovascular status: blood pressure returned to baseline and stable Postop Assessment: no apparent nausea or vomiting Anesthetic complications: no    Last Vitals:  Vitals:   11/08/19 0955 11/08/19 1010  BP: (!) 89/50 91/62  Pulse: 88 88  Resp: 14 20  Temp:    SpO2: 100% 100%    Last Pain:  Vitals:   11/08/19 0935  TempSrc:   PainSc: 0-No pain                 Mialani Reicks A.

## 2019-11-08 NOTE — Progress Notes (Signed)
Assesment/PLAN  Right femoral fracture status post  intertrochanteric nail placement 11/08/2019 Defer to orthopedics postop management-careful with pain meds given underlying dementia-I am discontinuing IV fentanyl, Reglan--- she was on Norco at home every 4 as needed and Dilaudid 2 mg I will start back 1 mg Dilaudid  Perioperative hypotension Was given albumin x1-monitor trends Holding lisinopril 2.5 at this time, Coreg 3.125 until can delineate trends  Reported dementia Resume Haldol but every 12 as needed instead of every 6  Metastatic small cell CA diagnosed 2008 recurrence 2020 Chronic underlying hypoxia on oxygen at home Patient both daughters were in the room's and they were very concerned about her losing hospice benefits and are not sure if they want her to go to rehab as they fear she may not receive individualized care-I have encouraged them to think about this decision with social work prior to discharge--complicated situation     S 86 year old female history of small cell CA 2008 with recurrence 2020 declined to pursue further therapy, HFrEF 20-25% 2020, chronic LBBB, dementia, osteoporosis Admission 11/2 through 11/5 with heart failure-discharged on hospice at home Returns with unwitnessed fall right leg pain and inability to bear weight x-ray = comminuted intertrochanteric femoral fracture underwent surgery 4/9    O BP (!) 102/48   Pulse 88   Temp 97.6 F (36.4 C) (Oral)   Resp 16   Ht 5\' 4"  (1.626 m)   Wt 64.4 kg   SpO2 99%   BMI 24.37 kg/m   Awake pleasant but somewhat incoherent intermittently calls out to her daughters for assistance with various things CTA B no added sound no rales no rhonchi S1-S2 no murmur Abdomen soft no rebound no guarding Postop changes to right lower extremity but I did not examine fully given recent surgery Neurologically grossly intact but slightly sleepy moving all 4 limbs on command except right lower extremity secondary to  pain  Labs/studies reviewed today

## 2019-11-08 NOTE — TOC Initial Note (Signed)
Transition of Care [facility]) - Initial/Assessment Note    Patient Details  Name: Sandra Ferguson MRN: [id number] Date of Birth: 1926/08/07  [facility] CM/SW Contact:    Sandra Mons, RN Phone Number: [phone] 11/08/2019, 1:37 PM  Clinical Narrative:      Pt presents with R hip pain, s/p fall . Pt suffered  R femur fx, hx of small cell lung cancer s/p lobectomy with recurrent, breast cancer, combined systolic and diastolic CHF, osteoporosis, dementia.    -s/p IM nailing of right intertrochanteric femur fracture, 4/9 Pt from home with daughter Sandra Ferguson. Pt with home hospice care, [facility].  NCM spoke with Sandra Ferguson @ bedside about d/cplanning. Sandra Ferguson stated she would like for mom to d/c back to home under hospice care. Awaiting PT/OT's evaluation. Sandra Ferguson is hoping mom will get back to her baseline, OOB walking independently. States if mom is working well with the therapist  (PT,OT), she might consider SNF placement if recommended.  PT/ot evaluations pending....  TOC team will continue to monitor for needs.  Expected Discharge Plan: Home w Hospice Care(vs ? SNF) Barriers to Discharge: Continued Medical Work up   Patient Goals and CMS Choice   CMS Medicare.gov Compare Post Acute Care list provided to:: Patient Represenative (must comment)(Sandra Ferguson( daughter))    Expected Discharge Plan and Services Expected Discharge Plan: Home w Hospice Care(vs ? SNF)   Discharge Planning Services: CM Consult   Living arrangements for the past 2 months: Single Family Home                                      Prior Living Arrangements/Services Living arrangements for the past 2 months: Single Family Home Lives with:: Adult Children                   Activities of Daily Living Home Assistive Devices/Equipment: Cane (specify quad or straight), Walker (specify type) ADL Screening (condition at time of admission) Patient's cognitive ability adequate to safely complete  daily activities?: Yes Is the patient deaf or have difficulty hearing?: Yes Does the patient have difficulty seeing, even when wearing glasses/contacts?: No Does the patient have difficulty concentrating, remembering, or making decisions?: No Patient able to express need for assistance with ADLs?: Yes Does the patient have difficulty dressing or bathing?: Yes Independently performs ADLs?: No Does the patient have difficulty walking or climbing stairs?: Yes Weakness of Legs: Both Weakness of Arms/Hands: Both  Permission Sought/Granted                  Emotional Assessment              Admission diagnosis:  Fracture, intertrochanteric, right femur ([place]) [S72.141A] Closed fracture of right hip, initial encounter ([place]) [S72.001A] Closed comminuted intertrochanteric fracture of right femur, initial encounter ([place]) [S72.141A] Patient Active Problem List   Diagnosis Date Noted  . Closed comminuted intertrochanteric fracture of proximal end of right femur ([place]) 11/07/2019  . Fracture, intertrochanteric, right femur ([place]) 11/07/2019  . Chronic combined systolic and diastolic CHF (congestive heart failure) ([place]) 11/07/2019  . Normocytic anemia 11/07/2019  . Hospice care 11/07/2019  . DNR (do not resuscitate) 11/07/2019  . Acute diastolic heart failure ([place]) 06/04/2019  . Malignant neoplasm of upper lobe of left lung ([place]) 05/02/2018  . Left upper lobe pulmonary nodule 02/21/2018  . Centrilobular emphysema ([place]) 02/21/2018  . Malignant neoplasm of  upper-outer quadrant of female breast ([place]) 03/07/2014  . Urinary frequency 12/31/2012  . Osteoporosis 09/05/2012  . History of breast cancer 08/15/2012  . Small cell lung cancer ([place]) 08/25/2011   PCP:  Merrilee Seashore, MD Pharmacy:   [id number] - [place], [place] - [address] [facility] [place] [address] Phone: [phone] Fax: [phone]     Social Determinants of Health (SDOH) Interventions     Readmission Risk Interventions No flowsheet data found.

## 2019-11-08 NOTE — Progress Notes (Signed)
1120 received pt from PACU, A&O x3. Right hip incision with Mipilex dressing dry and intact. Ice pack in place. Denies pain at this time.

## 2019-11-08 NOTE — Interval H&P Note (Signed)
History and Physical Interval Note:  11/08/2019 7:21 AM  Sandra Ferguson  has presented today for surgery, with the diagnosis of Right hip fx.  The various methods of treatment have been discussed with the patient and family. After consideration of risks, benefits and other options for treatment, the patient has consented to  Procedure(s): INTRAMEDULLARY (IM) NAIL INTERTROCHANTRIC (Right) as a surgical intervention.  The patient's history has been reviewed, patient examined, no change in status, stable for surgery.  I have reviewed the patient's chart and labs.  Questions were answered to the patient's satisfaction.     Lennette Bihari P Io [place]juste

## 2019-11-08 NOTE — Transfer of Care (Signed)
Immediate Anesthesia Transfer of Care Note  Patient: Sandra Ferguson  Procedure(s) Performed: INTRAMEDULLARY (IM) NAIL INTERTROCHANTRIC (Right Leg Upper)  Patient Location: PACU  Anesthesia Type:MAC, Spinal and MAC combined with regional for post-op pain  Level of Consciousness: drowsy and patient cooperative  Airway & Oxygen Therapy: Patient Spontanous Breathing and Patient connected to face mask oxygen  Post-op Assessment: Report given to RN and Post -op Vital signs reviewed and stable  Post vital signs: Reviewed and stable  Last Vitals:  Vitals Value Taken Time  BP 92/48 11/08/19 0906  Temp    Pulse 90 11/08/19 0908  Resp 15 11/08/19 0908  SpO2 100 % 11/08/19 0908  Vitals shown include unvalidated device data.  Last Pain:  Vitals:   11/07/19 2330  TempSrc:   PainSc: 10-Worst pain ever         Complications: No apparent anesthesia complications

## 2019-11-08 NOTE — Progress Notes (Signed)
[facility] called and reported ST elevation of 3.9 MCL leas .[place] notified orders given for stat EKG , Called physician with findings , patient scheduled for surgery this AM TRH doctor advised me to alert pre op of results

## 2019-11-09 ENCOUNTER — Other Ambulatory Visit: Payer: Self-pay

## 2019-11-09 LAB — BASIC METABOLIC PANEL
Anion gap: 11 (ref 5–15)
BUN: 9 mg/dL (ref 8–23)
CO2: 28 mmol/L (ref 22–32)
Calcium: 8.6 mg/dL — ABNORMAL LOW (ref 8.9–10.3)
Chloride: 93 mmol/L — ABNORMAL LOW (ref 98–111)
Creatinine, Ser: 0.74 mg/dL (ref 0.44–1.00)
GFR calc Af Amer: >60 mL/min (ref >60)
GFR calc non Af Amer: >60 mL/min (ref >60)
Glucose, Bld: 122 mg/dL — ABNORMAL HIGH (ref 70–99)
Potassium: 4 mmol/L (ref 3.5–5.1)
Sodium: 132 mmol/L — ABNORMAL LOW (ref 135–145)

## 2019-11-09 LAB — CBC
HCT: 29 % — ABNORMAL LOW (ref 36.0–46.0)
Hemoglobin: 9.5 g/dL — ABNORMAL LOW (ref 12.0–15.0)
MCH: 30.7 pg (ref 26.0–34.0)
MCHC: 32.8 g/dL (ref 30.0–36.0)
MCV: 93.9 fL (ref 80.0–100.0)
Platelets: 155 10*3/uL (ref 150–400)
RBC: 3.09 MIL/uL — ABNORMAL LOW (ref 3.87–5.11)
RDW: 13 % (ref 11.5–15.5)
WBC: 6.1 10*3/uL (ref 4.0–10.5)
nRBC: 0 % (ref 0.0–0.2)

## 2019-11-09 LAB — VITAMIN D 25 HYDROXY (VIT D DEFICIENCY, FRACTURES): Vit D, 25-Hydroxy: 18.71 ng/mL — ABNORMAL LOW (ref 30–100)

## 2019-11-09 MED ORDER — ALBUMIN HUMAN 25 % IV SOLN
25.0000 g | Freq: Once | INTRAVENOUS | Status: AC
  Administered 2019-11-09: 25 g via INTRAVENOUS
  Filled 2019-11-09: qty 100

## 2019-11-09 NOTE — Progress Notes (Signed)
On call phys notified of low BP- orders obtained

## 2019-11-09 NOTE — Progress Notes (Signed)
Assesment/PLAN  Right femoral fracture status post  intertrochanteric nail placement 11/08/2019 Defer to orthopedics postop management-continue on Norco at home every 4 as needed and Dilaudid 2 mg I will start back 1 mg Dilaudid  Perioperative hypotension Was given albumin x1-monitor trends Blood pressure remains low normal and patient not a candidate to resume lisinopril 2.5 at this time, Coreg 3.125  Reported dementia Resume Haldol but every 12 as needed instead of every 6  Metastatic small cell CA diagnosed 2008 recurrence 2020 Chronic underlying hypoxia on oxygen at home Patient both daughters were in the room's and they were very concerned about her losing hospice benefits--I have conferenced with therapy services who will be reevaluating the patient 4/12 AM to see her tolerance of therapy services and only then can we make a decision regarding disposition either home with hospice or to a rehab facility-I have reached out to let the social worker know to help support the patient and I appreciate [place] [facility] coming out to help assist with this  DNR-prophylactic Lovenox-disposition as above  S 86 year old female history of small cell CA 2008 with recurrence 2020 declined to pursue further therapy, HFrEF 20-25% 2020, chronic LBBB, dementia, osteoporosis Admission 11/2 through 11/5 with heart failure-discharged on hospice at home Returns with unwitnessed fall right leg pain and inability to bear weight x-ray = comminuted intertrochanteric femoral fracture underwent surgery 4/9    O BP 106/63 (BP Location: Right Arm)   Pulse 87   Temp 98.5 F (36.9 C) (Oral)   Resp 14   Ht 5\' 4"  (1.626 m)   Wt 64.4 kg   SpO2 100%   BMI 24.37 kg/m   Awake pleasant but somewhat incoherent intermittently calls out to her daughters for assistance with various things CTA B no added sound no rales no rhonchi S1-S2 no murmur Abdomen soft no rebound no guarding Postop changes to right lower  extremity but I did not examine fully given recent surgery Neurologically grossly intact but slightly sleepy moving all 4 limbs on command except right lower extremity secondary to pain  Labs/studies reviewed today  Sodium 132 potassium 4.0 BUN/creatinine 9/0.7 Hemoglobin 9.5 platelet 155 WBC 6.1

## 2019-11-09 NOTE — Evaluation (Signed)
Occupational Therapy Evaluation Patient Details Name: Sandra Ferguson MRN: [id number] DOB: 01-29-1927 Today's Date: 11/09/2019    History of Present Illness Sandra Ferguson is a 86 y.o. female with medical history significant of small cell lung cancer s/p lobectomy with recurrent, breast cancer, combined systolic and diastolic CHF, osteoporosis, dementia, and on hospice presents after having a unwitnessed fall with complaints of right leg. Found to have a right intertrochanteric femur fracture s/p IMN 11/08/2019.   Clinical Impression   This 86 yo female admitted and underwent above presents to acute OT with increased pain, decreased mobility,decreased balance, pta dementia all affecting her safety and independence now. She will benefit from acute OT with follow up OT at SNF.    Follow Up Recommendations  SNF;Supervision/Assistance - 24 hour    Equipment Recommendations  Hospital bed       Precautions / Restrictions Precautions Precautions: Fall Restrictions Weight Bearing Restrictions: No RLE Weight Bearing: Weight bearing as tolerated      Mobility Bed Mobility Overal bed mobility: Needs Assistance Bed Mobility: Supine to Sit     Supine to sit: Max assist;+2 for physical assistance     General bed mobility comments: MaxA + 2 for transition from supine to sit, pt able to assist minimally by bringing LLE towards edge of bed  Transfers Overall transfer level: Needs assistance Equipment used: Rolling walker (2 wheeled) Transfers: Sit to/from Stand Sit to Stand: Max assist;+2 physical assistance         General transfer comment: MaxA + 2 to stand from edge of bed x 3 with use of bed pad. Pt preference to pull up from walker despite repeated cues. Pt with crouched posture and unable to stand fully upright. Could not pivot at this time, so BSC brought up behind patient and subsequently transferred from [facility] to recliner in same format.     Balance Overall balance assessment:  Needs assistance Sitting-balance support: Feet supported Sitting balance-Leahy Scale: Fair     Standing balance support: Bilateral upper extremity supported Standing balance-Leahy Scale: Poor                             ADL either performed or assessed with clinical judgement   ADL Overall ADL's : Needs assistance/impaired Eating/Feeding: Independent Eating/Feeding Details (indicate cue type and reason): supported sitting Grooming: Set up;Supervision/safety Grooming Details (indicate cue type and reason): supported sitting Upper Body Bathing: Set up;Supervision/ safety Upper Body Bathing Details (indicate cue type and reason): supported sitting Lower Body Bathing: Maximal assistance;+2 for physical assistance;Sit to/from stand   Upper Body Dressing : Minimal assistance Upper Body Dressing Details (indicate cue type and reason): supported sitting Lower Body Dressing: Total assistance Lower Body Dressing Details (indicate cue type and reason): max A +2 sit<>stand Toilet Transfer: Maximal assistance;+2 for physical assistance;RW Toilet Transfer Details (indicate cue type and reason): sit<>stand Max A +2 (then change out chair behind her for 3n1--3rd person) Toileting- Water quality scientist and Hygiene: Total assistance Toileting - Clothing Manipulation Details (indicate cue type and reason): Max A +2 sit<>stand             Vision Baseline Vision/History: Macular Degeneration Patient Visual Report: No change from baseline              Pertinent Vitals/Pain Pain Assessment: Faces Faces Pain Scale: Hurts worst Pain Location: RLE Pain Descriptors / Indicators: Grimacing;Guarding;Moaning;Sore Pain Intervention(s): Limited activity within patient's tolerance;Monitored during session;Patient requesting pain meds-RN notified;Repositioned  Hand Dominance Right   Extremity/Trunk Assessment Upper Extremity Assessment Upper Extremity Assessment: Generalized  weakness           Communication Communication Communication: No difficulties   Cognition Arousal/Alertness: Awake/alert Behavior During Therapy: WFL for tasks assessed/performed Overall Cognitive Status: History of cognitive impairments - at baseline                                 General Comments: History of dementia; pt able to follow most 1 step commands. Distracted by pain.              Home Living Family/patient expects to be discharged to:: Private residence Living Arrangements: Children Available Help at Discharge: Family;Available PRN/intermittently Type of Home: House Home Access: Level entry     Home Layout: One level     Bathroom Shower/Tub: Occupational psychologist: Handicapped height     Home Equipment: Cane - single point;Shower seat;Wheelchair - [employer] - 4 wheels;Walker - 2 wheels;Bedside commode          Prior Functioning/Environment Level of Independence: Needs assistance  Gait / Transfers Assistance Needed: uses Rollator ADL's / Homemaking Assistance Needed: requires assist for ADL's            OT Problem List: Decreased strength;Decreased range of motion;Impaired balance (sitting and/or standing);Decreased cognition;Pain      OT Treatment/Interventions: Self-care/ADL training;DME and/or AE instruction;Patient/family education;Balance training;Therapeutic activities    OT Goals(Current goals can be found in the care plan section) Acute Rehab OT Goals Patient Stated Goal: go home and less pain OT Goal Formulation: With patient/family Time For Goal Achievement: 11/23/19 Potential to Achieve Goals: Fair  OT Frequency: Min 2X/week           Co-evaluation PT/OT/SLP Co-Evaluation/Treatment: Yes Reason for Co-Treatment: For patient/therapist safety;To address functional/ADL transfers PT goals addressed during session: Mobility/safety with mobility OT goals addressed during session: ADL's and  self-care;Strengthening/ROM      AM-PAC OT "6 Clicks" Daily Activity     Outcome Measure Help from another person eating meals?: None Help from another person taking care of personal grooming?: A Little Help from another person toileting, which includes using toliet, bedpan, or urinal?: A Lot Help from another person bathing (including washing, rinsing, drying)?: A Lot Help from another person to put on and taking off regular upper body clothing?: A Little Help from another person to put on and taking off regular lower body clothing?: Total 6 Click Score: 15   End of Session Equipment Utilized During Treatment: Gait belt;Rolling walker Nurse Communication: Mobility status(Can stand with Max A+2 but cannot turn her feet (need to have pt stand and then change out furniture behind her))  Activity Tolerance: Patient limited by pain Patient left: in chair;with call bell/phone within reach  OT Visit Diagnosis: Other abnormalities of gait and mobility (R26.89);Unsteadiness on feet (R26.81);Muscle weakness (generalized) (M62.81);Pain;Other symptoms and signs involving cognitive function Pain - Right/Left: Right Pain - part of body: Leg                Time: 0823-0904 OT Time Calculation (min): 41 min Charges:  OT General Charges $OT Visit: 1 Visit OT Evaluation $OT Eval Moderate Complexity: 1 Mod  Golden Circle, OTR/L Acute [employer] Pager [phone] Office [phone]     Almon Register 11/09/2019, 5:10 PM

## 2019-11-09 NOTE — Progress Notes (Signed)
[facility]:  [facility]:   Continue to support the pt's family as pt recovers from surgery. Unknown at this time what the goals are if pt is unable to participate with PT then the family may be interested in her going back home or to Hospice facility if she meet criteria. If the pt does participate with PT and it is recommended. The family may want her to go to SNF for continued therapy.  Webb Silversmith RN [phone]

## 2019-11-09 NOTE — Plan of Care (Signed)
  Problem: Activity: Goal: Risk for activity intolerance will decrease Outcome: Not Progressing   

## 2019-11-09 NOTE — Evaluation (Signed)
Physical Therapy Evaluation Patient Details Name: Sandra Ferguson MRN: [id number] DOB: 1927-05-21 Today's Date: 11/09/2019   History of Present Illness  Sandra Ferguson is a 86 y.o. female with medical history significant of small cell lung cancer s/p lobectomy with recurrent, breast cancer, combined systolic and diastolic CHF, osteoporosis, dementia, and on hospice presents after having a unwitnessed fall with complaints of right leg. Found to have a right intertrochanteric femur fracture s/p IMN 11/09/2019.   Clinical Impression  Pt presents s/p procedure listed above. Prior to admission, pt lives with her daughter and is ambulatory with a Rollator and requires assist for ADL's. Pt presents with decreased functional mobility secondary to R hip pain, weakness, balance impairments, decreased cognition, decreased activity tolerance. Requiring two person maximal assist to stand to a walker and unable to ambulate. Desaturation to 88% on RA and placed back on 2L O2; rebounded to 96%. Recommending SNF to address deficits, maximize functional mobility and decrease caregiver burden.     Follow Up Recommendations SNF    Equipment Recommendations  Other (comment);Hospital bed(Hoyer lift)    Recommendations for Other Services       Precautions / Restrictions Precautions Precautions: Fall Restrictions Weight Bearing Restrictions: Yes RLE Weight Bearing: Weight bearing as tolerated      Mobility  Bed Mobility Overal bed mobility: Needs Assistance Bed Mobility: Supine to Sit     Supine to sit: Max assist;+2 for physical assistance     General bed mobility comments: MaxA + 2 for transition from supine to sit, pt able to assist minimally by bringing LLE towards edge of bed  Transfers Overall transfer level: Needs assistance Equipment used: Rolling walker (2 wheeled) Transfers: Sit to/from Stand Sit to Stand: Max assist;+2 physical assistance         General transfer comment: MaxA + 2  to stand from edge of bed x 3 with use of bed pad. Pt preference to pull up from walker despite repeated cues. Pt with crouched posture and unable to stand fully upright. Could not pivot at this time, so BSC brought up behind patient and subsequently transferred from [facility] to recliner in same format.   Ambulation/Gait             General Gait Details: unable  Stairs            Wheelchair Mobility    Modified Rankin (Stroke Patients Only)       Balance Overall balance assessment: Needs assistance Sitting-balance support: Feet supported Sitting balance-Leahy Scale: Fair     Standing balance support: Bilateral upper extremity supported Standing balance-Leahy Scale: Poor                               Pertinent Vitals/Pain Pain Assessment: Faces Faces Pain Scale: Hurts worst Pain Location: RLE Pain Descriptors / Indicators: Grimacing;Guarding;Moaning;Sore Pain Intervention(s): Limited activity within patient's tolerance;Monitored during session;Patient requesting pain meds-RN notified;Repositioned    Home Living Family/patient expects to be discharged to:: Private residence Living Arrangements: Children Available Help at Discharge: Family;Available PRN/intermittently Type of Home: House Home Access: Level entry     Home Layout: One level Home Equipment: Cane - single point;Shower seat;Wheelchair - [employer] - 4 wheels;Walker - 2 wheels;Bedside commode      Prior Function Level of Independence: Needs assistance   Gait / Transfers Assistance Needed: uses Rollator  ADL's / Homemaking Assistance Needed: requires assist for ADL's  Hand Dominance        Extremity/Trunk Assessment   Upper Extremity Assessment Upper Extremity Assessment: Defer to OT evaluation    Lower Extremity Assessment Lower Extremity Assessment: RLE deficits/detail;LLE deficits/detail RLE Deficits / Details: Able to perform limited LAQ; ankle  dorsiflexion/plantarflexion ROM WFL LLE Deficits / Details: Grossly 3/5    Cervical / Trunk Assessment Cervical / Trunk Assessment: Kyphotic  Communication   Communication: No difficulties  Cognition Arousal/Alertness: Awake/alert Behavior During Therapy: WFL for tasks assessed/performed Overall Cognitive Status: History of cognitive impairments - at baseline                                 General Comments: History of dementia; pt able to follow most 1 step commands. Distracted by pain.      General Comments      Exercises     Assessment/Plan    PT Assessment Patient needs continued PT services  PT Problem List Decreased strength;Decreased range of motion;Decreased activity tolerance;Decreased balance;Decreased mobility;Decreased cognition;Decreased safety awareness;Pain       PT Treatment Interventions DME instruction;Gait training;Functional mobility training;Therapeutic activities;Therapeutic exercise;Balance training;Wheelchair mobility training;Patient/family education    PT Goals (Current goals can be found in the Care Plan section)  Acute Rehab PT Goals Patient Stated Goal: go home and less pain PT Goal Formulation: With patient/family Time For Goal Achievement: 11/23/19 Potential to Achieve Goals: Fair    Frequency Min 3X/week   Barriers to discharge        Co-evaluation PT/OT/SLP Co-Evaluation/Treatment: Yes Reason for Co-Treatment: For patient/therapist safety;To address functional/ADL transfers PT goals addressed during session: Mobility/safety with mobility         AM-PAC PT "6 Clicks" Mobility  Outcome Measure Help needed turning from your back to your side while in a flat bed without using bedrails?: A Lot Help needed moving from lying on your back to sitting on the side of a flat bed without using bedrails?: Total Help needed moving to and from a bed to a chair (including a wheelchair)?: Total Help needed standing up from a chair  using your arms (e.g., wheelchair or bedside chair)?: Total Help needed to walk in hospital room?: Total Help needed climbing 3-5 steps with a railing? : Total 6 Click Score: 7    End of Session Equipment Utilized During Treatment: Gait belt;Oxygen Activity Tolerance: Patient limited by pain Patient left: in chair;with call bell/phone within reach;with chair alarm set;with family/visitor present Nurse Communication: Mobility status PT Visit Diagnosis: Unsteadiness on feet (R26.81);History of falling (Z91.81);Muscle weakness (generalized) (M62.81);Difficulty in walking, not elsewhere classified (R26.2);Pain Pain - Right/Left: Right Pain - part of body: Hip    Time: 0827-0905 PT Time Calculation (min) (ACUTE ONLY): 38 min   Charges:   PT Evaluation $PT Eval Moderate Complexity: 1 Mod PT Treatments $Therapeutic Activity: 8-22 mins          Wyona Almas, PT, DPT Acute Rehabilitation Services Pager [phone] Office [phone]   Deno Etienne 11/09/2019, 11:15 AM

## 2019-11-10 LAB — BASIC METABOLIC PANEL
Anion gap: 8 (ref 5–15)
BUN: 8 mg/dL (ref 8–23)
CO2: 31 mmol/L (ref 22–32)
Calcium: 8.6 mg/dL — ABNORMAL LOW (ref 8.9–10.3)
Chloride: 92 mmol/L — ABNORMAL LOW (ref 98–111)
Creatinine, Ser: 0.65 mg/dL (ref 0.44–1.00)
GFR calc Af Amer: >60 mL/min (ref >60)
GFR calc non Af Amer: >60 mL/min (ref >60)
Glucose, Bld: 108 mg/dL — ABNORMAL HIGH (ref 70–99)
Potassium: 4 mmol/L (ref 3.5–5.1)
Sodium: 131 mmol/L — ABNORMAL LOW (ref 135–145)

## 2019-11-10 LAB — CBC
HCT: 25.4 % — ABNORMAL LOW (ref 36.0–46.0)
Hemoglobin: 8.5 g/dL — ABNORMAL LOW (ref 12.0–15.0)
MCH: 30.7 pg (ref 26.0–34.0)
MCHC: 33.5 g/dL (ref 30.0–36.0)
MCV: 91.7 fL (ref 80.0–100.0)
Platelets: 147 10*3/uL — ABNORMAL LOW (ref 150–400)
RBC: 2.77 MIL/uL — ABNORMAL LOW (ref 3.87–5.11)
RDW: 12.8 % (ref 11.5–15.5)
WBC: 5.9 10*3/uL (ref 4.0–10.5)
nRBC: 0 % (ref 0.0–0.2)

## 2019-11-10 NOTE — Plan of Care (Signed)

## 2019-11-10 NOTE — Progress Notes (Signed)
Assesment/PLAN  Right femoral fracture status post  intertrochanteric nail placement 11/08/2019 Defer to orthopedics postop management-continue on Norco at home every 4 as needed and Dilaudid 2 mg I will start back 1 mg Dilaudid  Perioperative hypotension Was given albumin x1-monitor trends Blood pressure remains low normal and patient not a candidate to resume lisinopril 2.5 at this time, Coreg 3.125  Reported dementia Resume Haldol but every 12 as needed instead of every 6  Hyponatremia Could be secondary to poor solute intake versus metastatic small cell CA-trend-, would volume restricted falls below 130  Metastatic small cell CA diagnosed 2008 recurrence 2020 Chronic underlying hypoxia on oxygen at home Patient on hospice since October 2020 Further discussions regarding discharge as below   DNR-prophylactic Lovenox-disposition as above Await  4/12 AM to see her tolerance of therapy services over nex several days prior to decision regarding hospice at home versus skilled placement   Today-sitting up in bed appears coherent but in  occasional pain sometimes does not recall has had surgery to right hip no chest pain no fever poor appetite-does not drop her oxygen levels when taken off oxygen  S 86 year old female history of small cell CA 2008 with recurrence 2020 declined to pursue further therapy, HFrEF 20-25% 2020, chronic LBBB, dementia, osteoporosis Admission 11/2 through 11/5 with heart failure-discharged on hospice at home Returns with unwitnessed fall right leg pain and inability to bear weight x-ray = comminuted intertrochanteric femoral fracture underwent surgery 4/9   O BP (!) 92/58 (BP Location: Right Arm)   Pulse 81   Temp (!) 97.5 F (36.4 C) (Oral)   Resp 16   Ht 5\' 4"  (1.626 m)   Wt 64.4 kg   SpO2 99%   BMI 24.37 kg/m    Labs/studies reviewed today  Sodium 132-->131 potassium 4.0 BUN/creatinine 8/0.6 Hemoglobin 9.5-->8.5 platelet 155-->147, WBC  5.9

## 2019-11-11 ENCOUNTER — Encounter: Payer: Self-pay | Admitting: *Deleted

## 2019-11-11 ENCOUNTER — Inpatient Hospital Stay (HOSPITAL_COMMUNITY)

## 2019-11-11 LAB — CBC
HCT: 26.2 % — ABNORMAL LOW (ref 36.0–46.0)
Hemoglobin: 8.7 g/dL — ABNORMAL LOW (ref 12.0–15.0)
MCH: 30.6 pg (ref 26.0–34.0)
MCHC: 33.2 g/dL (ref 30.0–36.0)
MCV: 92.3 fL (ref 80.0–100.0)
Platelets: 189 10*3/uL (ref 150–400)
RBC: 2.84 MIL/uL — ABNORMAL LOW (ref 3.87–5.11)
RDW: 13 % (ref 11.5–15.5)
WBC: 6.6 10*3/uL (ref 4.0–10.5)
nRBC: 0 % (ref 0.0–0.2)

## 2019-11-11 MED ORDER — SORBITOL 70 % SOLN: 15.0000 mL | Freq: Every day | 0 refills | Status: AC

## 2019-11-11 MED ORDER — FUROSEMIDE 20 MG PO TABS: 20.0000 mg | ORAL_TABLET | ORAL | 11 refills | Status: AC | PRN

## 2019-11-11 MED ORDER — DIAZEPAM 2 MG PO TABS: 2.0000 mg | ORAL_TABLET | Freq: Four times a day (QID) | ORAL | 0 refills | Status: AC | PRN

## 2019-11-11 MED ORDER — SORBITOL 70 % SOLN
15.0000 mL | Freq: Every day | Status: DC
  Administered 2019-11-11: 15 mL via ORAL
  Filled 2019-11-11: qty 30

## 2019-11-11 MED ORDER — DIAZEPAM 2 MG PO TABS
2.0000 mg | ORAL_TABLET | Freq: Four times a day (QID) | ORAL | Status: DC | PRN
  Administered 2019-11-11: 2 mg via ORAL
  Filled 2019-11-11: qty 1

## 2019-11-11 NOTE — Progress Notes (Signed)
[facility]: Joya Salm  Pt daughter [name] continues to feel pt is not wanting to participate with PT/OT. She feels that she can not take the pt home yet due to her being bed bound now from surgery and states her mom is still having some pain from the surgery. Discussed goals and she is in agreement with pt transferring to the [facility] for [place] to continue to focus on sx management needs and possibly end of life care.   Spoke to the [employer] and updated her that pt has been approved for this transfer if MD is in agreement.   Levada Dy will contact MD and arrange ambulance transportation when she is ready for d/c today. Webb Silversmith RN [phone]

## 2019-11-11 NOTE — Discharge Summary (Signed)
Physician Discharge Summary  Sandra Ferguson:[id number] DOB: 1926/11/28 DOA: 11/07/2019 PCP: Merrilee Seashore, MD  Admit date: 11/07/2019 Discharge date: 11/11/2019  Time spent: 45 minutes  Recommendations for Outpatient Follow-up:  1. Patient going to freestanding hospice on discharge  Discharge Diagnoses:  Active Problems:   Small cell lung cancer (HCC)   History of breast cancer   Osteoporosis   Fracture, intertrochanteric, right femur (HCC)   Chronic combined systolic and diastolic CHF (congestive heart failure) (HCC)   Normocytic anemia   Hospice care   DNR (do not resuscitate)  Discharge Condition: Guarded  Diet recommendation: Comfort  Filed Weights   11/07/19 1322  Weight: 64.4 kg    History of present illness:  S 86 year old female history of small cell CA 2008 with recurrence 2020 declined to pursue further therapy, HFrEF 20-25% 2020, chronic LBBB, dementia, osteoporosis Admission 11/2 through 11/5 with heart failure-discharged on hospice at home Returns with unwitnessed fall right leg pain and inability to bear weight x-ray = comminuted intertrochanteric femoral fracture underwent surgery 4/9  Hospital Course:  Right femoral fracture status post  intertrochanteric nail placement 11/08/2019 Defer to orthopedics postop management-continue on resumed Dilaudid 2 mg daily on discharge Perioperative hypotension Was given albumin x1-monitor trends Blood pressure pressure is low-discontinued during hospitalization lisinopril 2.5 at this time, Coreg 3.125 Q. OD Lasix for comfort as above Reported dementia Resume Haldol but every 12 as needed instead of every 6 Hyponatremia Could be secondary to poor solute intake versus metastatic small cell CA-trend-, would volume restricted falls below 130 Metastatic small cell CA diagnosed 2008 recurrence 2020 Chronic underlying hypoxia on oxygen at home Patient on hospice since October 2020 Further discussions regarding  discharge as below Long discussion with daughter Olin Hauser at the bedside-she is spoken with hospice of the female who have also discussed the case in detail and as patient is not tolerating ambulation movement and not making effort she will be admitted to freestanding hospice to see if see how she does I have changed her Ativan to diazepam I have discontinued her Haldol and we will simplify some of her meds on discharge she should however take Lasix q. OD   Discharge Exam: Vitals:   11/11/19 0811 11/11/19 0930  BP: (!) 100/58   Pulse: 91   Resp: 17   Temp: 97.7 F (36.5 C)   SpO2: 100% 97%   Awake alert anxious-I received a call from nursing-patient is in no overt pain but feels short of breath however the sats are persistently above 95 despite being on 2 L Daughter tells me that patient said that she was "scared" but when I walk in the room she appears to recount that I had a chat with her about end-of-life and told her that it is okay to be scared and that when God calls you he calls you She seemed to be a little bit more comfortable with that  Daughter asks for something for anxiety She does not seem to be in pain She has not eaten in the past several days and has not passed a stool since being here so I changed her on some of the meds including adding sorbitol   General: Awake alert anxious coherent looking younger than stated age on 2 L of oxygen Cardiovascular: S1-S2 tachycardic Respiratory: Chest clear no added sound no rales Abdomen slightly tender Neurologically intact to power  no focal deficit  Discharge Instructions   Discharge Instructions    Diet - low sodium heart healthy  Complete by: As directed    Increase activity slowly   Complete by: As directed      Allergies as of 11/11/2019      Reactions   Morphine Sulfate Shortness Of Breath   Hallucinations and convulsions      Medication List    STOP taking these medications   ALPRAZolam 0.5 MG  tablet Commonly known as: XANAX   Baclofen 5 MG Tabs   carvedilol 3.125 MG tablet Commonly known as: COREG   haloperidol 0.5 MG tablet Commonly known as: HALDOL   HYDROcodone-acetaminophen 10-325 MG tablet Commonly known as: NORCO   lisinopril 2.5 MG tablet Commonly known as: ZESTRIL   Zithromax 500 MG tablet Generic drug: azithromycin     TAKE these medications   acetaminophen 325 MG tablet Commonly known as: TYLENOL Take 2 tablets (650 mg total) by mouth every 4 (four) hours as needed for headache or mild pain.   diazepam 2 MG tablet Commonly known as: VALIUM Take 1 tablet (2 mg total) by mouth every 6 (six) hours as needed for anxiety.   furosemide 20 MG tablet Commonly known as: Lasix Take 1 tablet (20 mg total) by mouth every other day as needed. What changed:   when to take this  reasons to take this   HYDROmorphone 2 MG tablet Commonly known as: DILAUDID Take 1 mg by mouth See admin instructions. Takes half tablet every 2 hours as needed for pain.   polyethylene glycol 17 g packet Commonly known as: MIRALAX / GLYCOLAX Take 17 g by mouth daily.   senna-docusate 8.6-50 MG tablet Commonly known as: Senokot-S Take 1 tablet by mouth at bedtime.   sorbitol 70 % Soln Take 15 mLs by mouth daily.      Allergies  Allergen Reactions  . Morphine Sulfate Shortness Of Breath    Hallucinations and convulsions      The results of significant diagnostics from this hospitalization (including imaging, microbiology, ancillary and laboratory) are listed below for reference.    Significant Diagnostic Studies: DG Chest 1 View  Result Date: 11/11/2019 CLINICAL DATA:  85 year old female with history of shortness of breath and weakness. EXAM: CHEST  1 VIEW COMPARISON:  Chest x-ray 11/07/2019. FINDINGS: Again noted is a large left upper lobe mass measuring 5.3 x 4.2 cm. Patchy ill-defined opacities and areas of interstitial prominence are noted elsewhere throughout  the mid to lower lungs bilaterally, increased slightly compared to the prior study. Suture line and surgical clips noted in the right perihilar region. Possible small right pleural effusion. No left pleural effusion. No evidence of pulmonary edema. Heart size is normal. Upper mediastinal contours are within normal limits. Aortic atherosclerosis. Surgical clips overlying the left breast and left axillary region. IMPRESSION: 1. Worsening aeration throughout the mid to lower lungs bilaterally, concerning for developing multilobar bilateral pneumonia. 2. Persistent left upper lobe mass highly concerning for primary bronchogenic carcinoma, better demonstrated on prior chest CT 06/03/2019. 3. Aortic atherosclerosis. Electronically Signed   By: Vinnie Langton M.D.   On: 11/11/2019 06:57   DG Chest 1 View  Result Date: 11/07/2019 CLINICAL DATA:  Pain following fall EXAM: CHEST  1 VIEW COMPARISON:  June 05, 2019 chest radiograph and chest CT June 03, 2019 FINDINGS: There is stable elevation of the right hemidiaphragm. The opacity in the left upper lobe which appears masslike on prior CT is again noted, currently measuring 5.1 x 4.3 cm, larger than on previous studies. There is atelectatic change in each lower lung  region. Heart size and pulmonary vascular normal. No adenopathy. There is aortic atherosclerosis. No adenopathy appreciable. No pneumothorax. No bone lesions evident. IMPRESSION: 1. Apparent mass in left upper lobe, larger than on previous study. Neoplasm in this area is suspected. 2. Stable elevation the right hemidiaphragm. Areas of lower lobe region atelectatic change bilaterally. 3.  Heart size normal. 4.  No evident adenopathy. 5.  Aortic Atherosclerosis (ICD10-I70.0). Electronically Signed   By: Lowella Grip III M.D.   On: 11/07/2019 14:43   CT Head Wo Contrast  Result Date: 11/07/2019 CLINICAL DATA:  Fall with trauma to the head. EXAM: CT HEAD WITHOUT CONTRAST TECHNIQUE: Contiguous axial  images were obtained from the base of the skull through the vertex without intravenous contrast. COMPARISON:  11/07/2011.  12/07/2016. FINDINGS: Brain: Age related atrophy. Chronic small-vessel ischemic changes of the hemispheric white matter. Old lacunar infarction left basal ganglia/anterior limb internal capsule, lateral left thalamus/posterior limb internal capsule and right external capsule. No mass, hemorrhage, hydrocephalus or extra-axial collection. Vascular: There is atherosclerotic calcification of the major vessels at the base of the brain. Skull: No skull fracture Sinuses/Orbits: Sinuses are clear. Old medial orbital blowout fracture on the left. Other: None IMPRESSION: No acute or traumatic finding. Atrophy and chronic small-vessel ischemic changes, progressive since 2018. Electronically Signed   By: Nelson Chimes M.D.   On: 11/07/2019 14:43   DG C-Arm 1-60 Min  Result Date: 11/08/2019 CLINICAL DATA:  Intramedullary rod fixation. EXAM: OPERATIVE right HIP (WITH PELVIS IF PERFORMED) 5 VIEWS TECHNIQUE: Fluoroscopic spot image(s) were submitted for interpretation post-operatively. FLUOROSCOPY TIME:  1 minutes 9 seconds. COMPARISON:  November 07, 2019. FINDINGS: Five intraoperative fluoroscopic images were obtained of the right hip. These demonstrate the surgical internal fixation of proximal right femoral fracture. Good alignment of fracture components is noted. IMPRESSION: Status post surgical internal fixation of proximal right femoral fracture. Electronically Signed   By: Marijo Conception M.D.   On: 11/08/2019 10:03   DG HIP PORT UNILAT W OR W/O PELVIS 1V RIGHT  Result Date: 11/08/2019 CLINICAL DATA:  ORIF right hip. EXAM: DG HIP (WITH OR WITHOUT PELVIS) 1V PORT RIGHT COMPARISON:  11/08/2019. FINDINGS: ORIF right hip. Hardware intact. Anatomic alignment. Degenerative change lumbar spine and left hip. Diffuse osteopenia. IMPRESSION: ORIF right hip.  Hardware intact.  Anatomic alignment. Electronically  Signed   By: Marcello Moores  Register   On: 11/08/2019 11:50   DG HIP OPERATIVE UNILAT WITH PELVIS RIGHT  Result Date: 11/08/2019 CLINICAL DATA:  Intramedullary rod fixation. EXAM: OPERATIVE right HIP (WITH PELVIS IF PERFORMED) 5 VIEWS TECHNIQUE: Fluoroscopic spot image(s) were submitted for interpretation post-operatively. FLUOROSCOPY TIME:  1 minutes 9 seconds. COMPARISON:  November 07, 2019. FINDINGS: Five intraoperative fluoroscopic images were obtained of the right hip. These demonstrate the surgical internal fixation of proximal right femoral fracture. Good alignment of fracture components is noted. IMPRESSION: Status post surgical internal fixation of proximal right femoral fracture. Electronically Signed   By: Marijo Conception M.D.   On: 11/08/2019 10:03   DG Hip Unilat W or Wo Pelvis 2-3 Views Right  Result Date: 11/07/2019 CLINICAL DATA:  Pain following fall EXAM: DG HIP (WITH OR WITHOUT PELVIS) 2-3V RIGHT COMPARISON:  None. FINDINGS: Frontal pelvis as well as frontal and lateral right hip images were obtained. There is a comminuted intertrochanteric femur fracture on the right with varus angulation at the fracture site. There is avulsion of the lesser trochanter and avulsion of a portion of the greater trochanter on  the right. No other fracture. No dislocation. Bones are diffusely osteoporotic. There is mild symmetric narrowing of each hip joint as well as degenerative change in the lower lumbar spine. IMPRESSION: 1. Comminuted intertrochanteric femur fracture on the right with varus angulation at the fracture site. 2.  Diffuse osteoporosis. 3.  No dislocation. 4. Symmetric narrowing of each hip joint. Degenerative change noted in the visualized lower lumbar region. Electronically Signed   By: Lowella Grip III M.D.   On: 11/07/2019 14:48    Microbiology: Recent Results (from the past 240 hour(s))  SARS CORONAVIRUS 2 (TAT 6-24 HRS) Nasopharyngeal Nasopharyngeal Swab     Status: None   Collection Time:  11/07/19  3:02 PM   Specimen: Nasopharyngeal Swab  Result Value Ref Range Status   SARS Coronavirus 2 NEGATIVE NEGATIVE Final    Comment: (NOTE) SARS-CoV-2 target nucleic acids are NOT DETECTED. The SARS-CoV-2 RNA is generally detectable in upper and lower respiratory specimens during the acute phase of infection. Negative results do not preclude SARS-CoV-2 infection, do not rule out co-infections with other pathogens, and should not be used as the sole basis for treatment or other patient management decisions. Negative results must be combined with clinical observations, patient history, and epidemiological information. The expected result is Negative. Fact Sheet for Patients: SugarRoll.be Fact Sheet for Healthcare Providers: https://www.woods-mathews.com/ This test is not yet approved or cleared by the [place] FDA and  has been authorized for detection and/or diagnosis of SARS-CoV-2 by FDA under an Emergency Use Authorization (EUA). This EUA will remain  in effect (meaning this test can be used) for the duration of the COVID-19 declaration under Section 56 4(b)(1) of the Act, 21 U.S.C. section 360bbb-3(b)(1), unless the authorization is terminated or revoked sooner. Performed at [facility], [place] [address], [place], [place] [address]   Surgical pcr screen     Status: None   Collection Time: 11/08/19  1:40 AM   Specimen: Nasal Mucosa; Nasal Swab  Result Value Ref Range Status   MRSA, PCR NEGATIVE NEGATIVE Final   Staphylococcus aureus NEGATIVE NEGATIVE Final    Comment: (NOTE) The Xpert SA Assay (FDA approved for NASAL specimens in patients 40 years of age and older), is one component of a comprehensive surveillance program. It is not intended to diagnose infection nor to guide or monitor treatment. Performed at [facility], [place] [address], [place], [place] [address]      Labs: Basic Metabolic Panel: Recent  Labs  Lab 11/07/19 1455 11/09/19 0232 11/10/19 0211  NA 137 132* 131*  K 4.8 4.0 4.0  CL 99 93* 92*  CO2 28 28 31   GLUCOSE 117* 122* 108*  BUN 10 9 8   CREATININE 0.63 0.74 0.65  CALCIUM 8.9 8.6* 8.6*   Liver Function Tests: No results for input(s): AST, ALT, ALKPHOS, BILITOT, PROT, ALBUMIN in the last 168 hours. No results for input(s): LIPASE, AMYLASE in the last 168 hours. No results for input(s): AMMONIA in the last 168 hours. CBC: Recent Labs  Lab 11/07/19 1455 11/08/19 0432 11/09/19 0232 11/10/19 0211 11/11/19 0508  WBC 5.5 6.5 6.1 5.9 6.6  NEUTROABS 4.3  --   --   --   --   HGB 11.0* 11.0* 9.5* 8.5* 8.7*  HCT 35.1* 33.3* 29.0* 25.4* 26.2*  MCV 96.4 94.3 93.9 91.7 92.3  PLT 160 185 155 147* 189   Cardiac Enzymes: No results for input(s): CKTOTAL, CKMB, CKMBINDEX, TROPONINI in the last 168 hours. BNP:  BNP (last 3 results) Recent Labs    06/03/19 1622 06/05/19 1004  BNP 904.4* 402.0*    ProBNP (last 3 results) No results for input(s): PROBNP in the last 8760 hours.  CBG: No results for input(s): GLUCAP in the last 168 hours.     Signed:  Nita Sells MD   [facility] 11/11/2019, 11:51 AM

## 2019-11-11 NOTE — Progress Notes (Signed)
Physical Therapy Treatment Patient Details Name: Sandra Ferguson MRN: [id number] DOB: 04-01-1927 Today's Date: 11/11/2019    History of Present Illness Sandra Ferguson is a 86 y.o. female with medical history significant of small cell lung cancer s/p lobectomy with recurrent, breast cancer, combined systolic and diastolic CHF, osteoporosis, dementia, and on hospice presents after having a unwitnessed fall with complaints of right leg. Found to have a right intertrochanteric femur fracture s/p IMN 11/08/2019.   PT Comments    Pt preparing for d/c to hospice house today. Daughter present to discuss pt's prognosis for mobility and likely DME needs (including hospital bed and hoyer lift). Educ daughter on supine BLE therex to complete with pt to encourage mobility, edema control and pressure relief. Pt remains limited by significant pain and nausea with RLE movement. Pt's HR 106 at rest, SpO2 95% on RA.   Follow Up Recommendations  Other (Hospice)     Equipment Recommendations  Hospital bed;Wheelchair (measurements PT);Wheelchair cushion (measurements PT);Hoyer lift   Recommendations for [employer]       Precautions / Restrictions Precautions Precautions: Fall Restrictions Weight Bearing Restrictions: Yes RLE Weight Bearing: Weight bearing as tolerated    Mobility  Bed Mobility               General bed mobility comments: Deferred; repositioned BLEs for comfort and raised HOB due to c/o nausea  Transfers                    Ambulation/Gait                 Stairs             Wheelchair Mobility    Modified Rankin (Stroke Patients Only)       Balance                                            Cognition Arousal/Alertness: Awake/alert Behavior During Therapy: WFL for tasks assessed/performed Overall Cognitive Status: History of cognitive impairments - at baseline                                 General  Comments: History of dementia; pt able to follow most 1 step commands. Distracted by pain.      Exercises General Exercises - Lower Extremity Ankle Circles/Pumps: AROM;Both;Supine Hip ABduction/ADduction: AAROM;Right;Supine(pt able to initiate, requires significant assist) Straight Leg Raises: PROM;Right;Supine Hip Flexion/Marching: PROM;Right;Supine Other Exercises Other Exercises: Daughter present for education on BLE AAROM/PROM since pt likely to stay in bed during time at hospice house    General Comments        Pertinent Vitals/Pain Pain Assessment: Faces Faces Pain Scale: Hurts whole lot Pain Location: RLE with PROM Pain Descriptors / Indicators: Grimacing;Guarding;Moaning;Sore Pain Intervention(s): Limited activity within patient's tolerance;Repositioned    Home Living                      Prior Function            PT Goals (current goals can now be found in the care plan section) Progress towards PT goals: Not progressing toward goals - comment(d/c to hospice house without PT; limited by pain)    Frequency    Min 2X/week      PT Plan Discharge plan  needs to be updated;Frequency needs to be updated    Co-evaluation              AM-PAC PT "6 Clicks" Mobility   Outcome Measure  Help needed turning from your back to your side while in a flat bed without using bedrails?: Total Help needed moving from lying on your back to sitting on the side of a flat bed without using bedrails?: Total Help needed moving to and from a bed to a chair (including a wheelchair)?: Total Help needed standing up from a chair using your arms (e.g., wheelchair or bedside chair)?: Total Help needed to walk in hospital room?: Total Help needed climbing 3-5 steps with a railing? : Total 6 Click Score: 6    End of Session Equipment Utilized During Treatment: Oxygen Activity Tolerance: Patient limited by pain Patient left: in bed;with call bell/phone within reach;with bed  alarm set;with family/visitor present Nurse Communication: Mobility status PT Visit Diagnosis: Unsteadiness on feet (R26.81);History of falling (Z91.81);Muscle weakness (generalized) (M62.81);Difficulty in walking, not elsewhere classified (R26.2);Pain Pain - Right/Left: Right Pain - part of body: Hip     Time: 4163-8453 PT Time Calculation (min) (ACUTE ONLY): 21 min  Charges:  $Therapeutic Exercise: 8-22 mins           Mabeline Caras, PT, DPT Acute Rehabilitation Services  Pager [phone] Office [place] 11/11/2019, 5:29 PM

## 2019-11-11 NOTE — Progress Notes (Signed)
Reporting having difficulty "catching my breath"- lung sounds clear- or no change-o2 sats remain high 90's on present oxygen setting- notified on call NP- order for chest x-ray which is presently being performed

## 2019-11-11 NOTE — Plan of Care (Signed)
  Problem: Education: Goal: Knowledge of General Education information will improve Description: Including pain rating scale, medication(s)/side effects and non-pharmacologic comfort measures Outcome: Progressing   Problem: Clinical Measurements: Goal: Respiratory complications will improve Outcome: Progressing Note: On chronic 2L O2   Problem: Nutrition: Goal: Adequate nutrition will be maintained Outcome: Progressing   Problem: Coping: Goal: Level of anxiety will decrease Outcome: Progressing Note: Pt very anxious, received valium once which helped a lot   Problem: Elimination: Goal: Will not experience complications related to urinary retention Outcome: Progressing   Problem: Pain Managment: Goal: General experience of comfort will improve Outcome: Progressing   Problem: Elimination: Goal: Will not experience complications related to bowel motility Outcome: Not Progressing Note: Sorbitol given, still no BM today

## 2019-11-11 NOTE — Progress Notes (Signed)
Nurse reports patient complaining of inability to catch her breath but no real change in work of breathing per nurse report.  VSS. Oxygen saturation stable, 95-100 % on 2L [place].   Heart failure and PE on differential given her history and recentl surgery.  Will check chest xray and hold CTA  PE study as she has not demonstrated classic symptoms.

## 2019-11-11 NOTE — TOC Transition Note (Signed)
Transition of Care [facility]) - CM/SW Discharge Note   Patient Details  Name: Sandra Ferguson MRN: [id number] Date of Birth: 1927-07-17  [facility] CM/SW Contact:  Sharin Mons, RN Phone Number: [phone] 11/11/2019, 11:45 AM   Clinical Narrative:     Patient will DC to: [facility] Anticipated DC date: 11/11/2019 Family notified: [name] ( daughter) Transport by: Corey Harold  Per MD patient ready for DC today to residential hospice, [facility]. RN, patient, patient's family, and facility notified of DC. Discharge Summary will be sent to facility once avaliable. RN to call report prior to discharge [phone]). DC packet on chart. Ambulance transport requested for patient.   RNCM will sign off for now as intervention is no longer needed. Please consult [place] again if new needs arise.   Pt will transition to residential hospice / .  Final next level of care: [place] Barriers to Discharge: No Barriers Identified   Patient Goals and CMS Choice   CMS Medicare.gov Compare Post Acute Care list provided to:: Patient Represenative (must comment)([name]( daughter))    Discharge Placement                       Discharge Plan and Services   Discharge Planning Services: CM Consult                                 Social Determinants of Health (SDOH) Interventions     Readmission Risk Interventions No flowsheet data found.

## 2019-11-11 NOTE — Plan of Care (Signed)
  Problem: Nutrition: Goal: Adequate nutrition will be maintained Outcome: Progressing   

## 2019-11-11 NOTE — Progress Notes (Signed)
Report called to [facility] at [place]. Awaiting PTAR for transport. Daughter at bedside who is aware.

## 2019-11-30 DEATH — deceased

## 2021-04-11 IMAGING — CR DG CHEST 1V
1 series · 1 of 1 positions shown · non-contrast
Comparison: June 05, 2019 chest radiograph and chest CT June 03, 2019

CLINICAL DATA: Pain following fall

EXAM:
CHEST  1 VIEW

[chest ap]
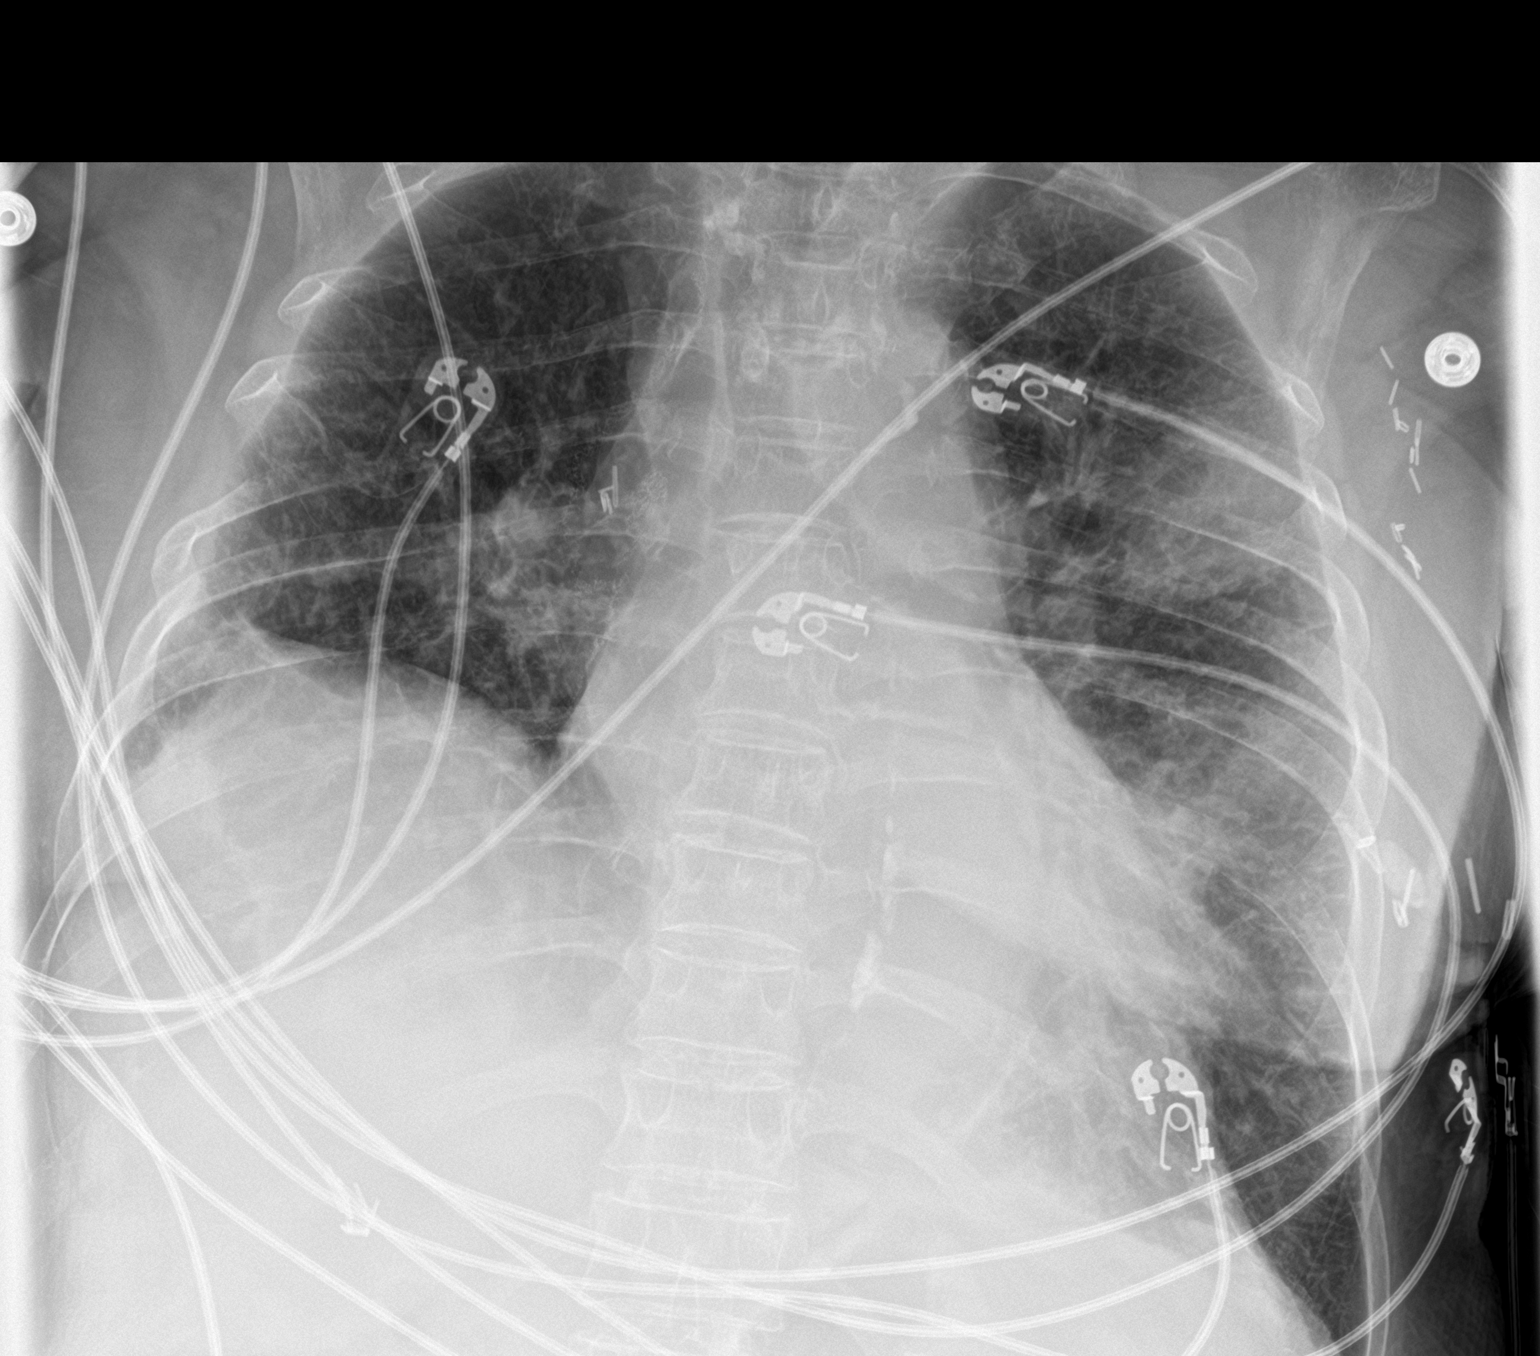

[1 of 1 positions shown; findings below may reference images not displayed]

FINDINGS: There is stable elevation of the right hemidiaphragm. The opacity in
the left upper lobe which appears masslike on prior CT is again
noted, currently measuring 5.1 x 4.3 cm, larger than on previous
studies. There is atelectatic change in each lower lung region.
Heart size and pulmonary vascular normal. No adenopathy. There is
aortic atherosclerosis. No adenopathy appreciable. No pneumothorax.
No bone lesions evident.
IMPRESSION: 1. Apparent mass in left upper lobe, larger than on previous study.
Neoplasm in this area is suspected.

2. Stable elevation the right hemidiaphragm. Areas of lower lobe
region atelectatic change bilaterally.

3.  Heart size normal.

4.  No evident adenopathy.

5.  Aortic Atherosclerosis ([id number]).

## 2021-04-11 IMAGING — CT CT HEAD W/O CM
3 series · 16 of 47 positions shown, 19 images · non-contrast
Comparison: 11/07/2011.  12/07/2016.

CLINICAL DATA: Fall with trauma to the head.

EXAM:
CT HEAD WITHOUT CONTRAST
TECHNIQUE: Contiguous axial images were obtained from the base of the skull
through the vertex without intravenous contrast.

[Series 3: head 5.0 h30s · axial · 0.41mm/px · z∈[-117,+23]mm · 10 of 34 slices shown, 13 images]
[im 3/34  brain]
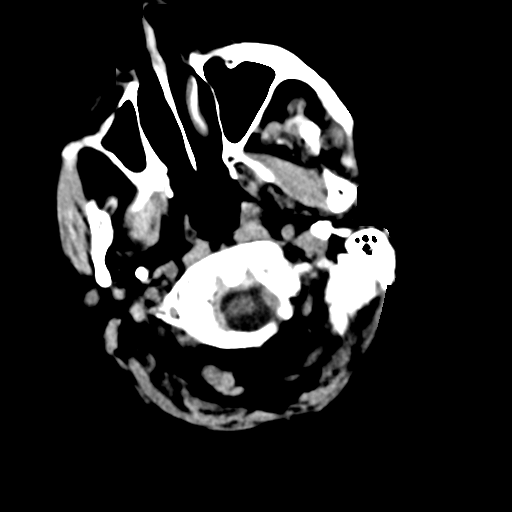
[im 3/34  bone]
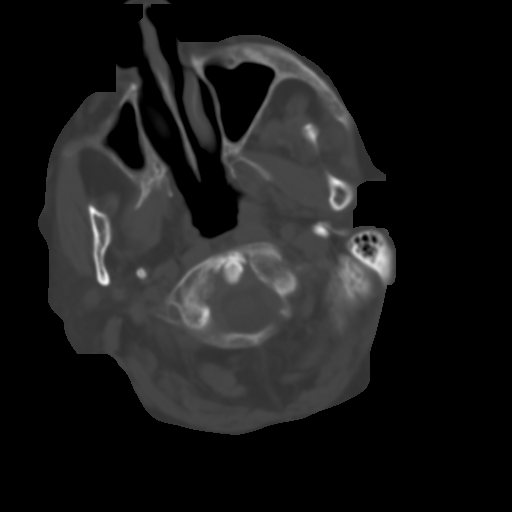
[im 6/34  brain]
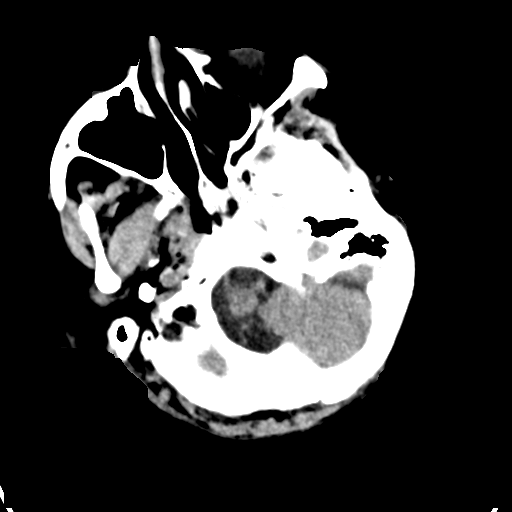
[im 10/34  brain]
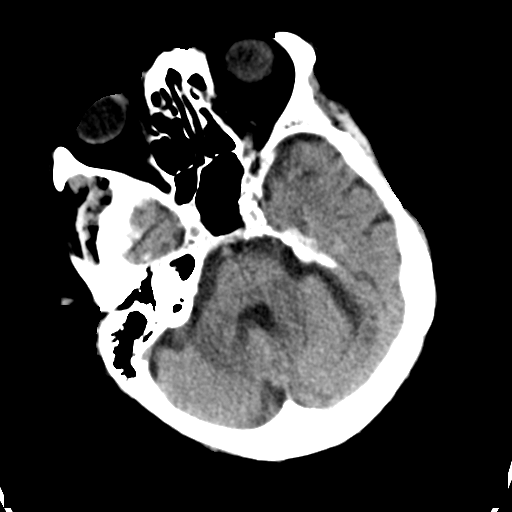
[im 12/34  brain]
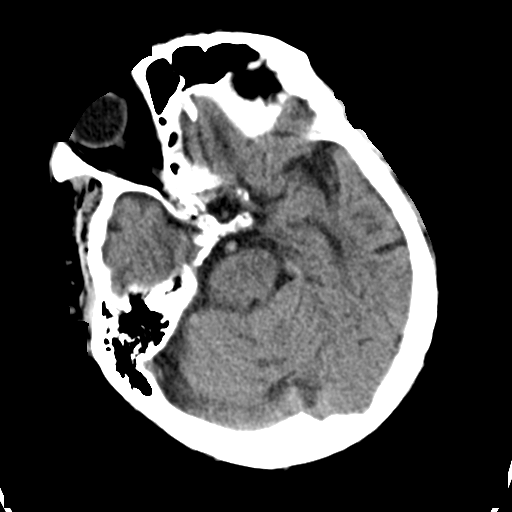
[im 15/34  brain]
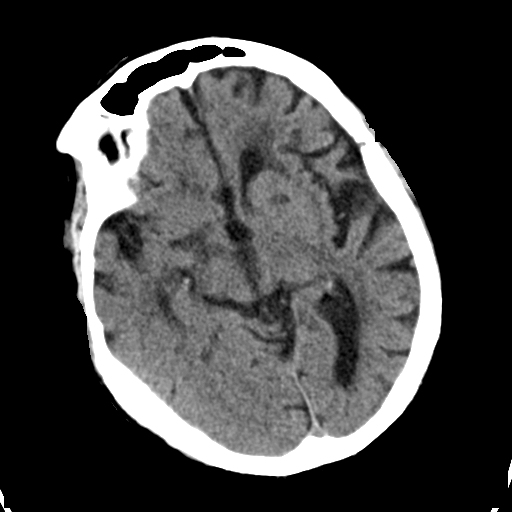
[im 15/34  bone]
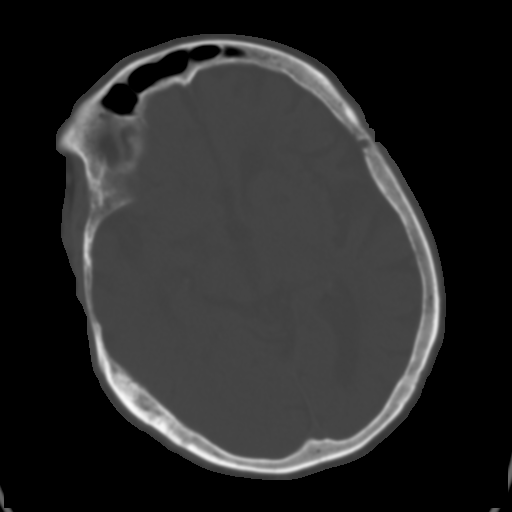
[im 19/34  brain]
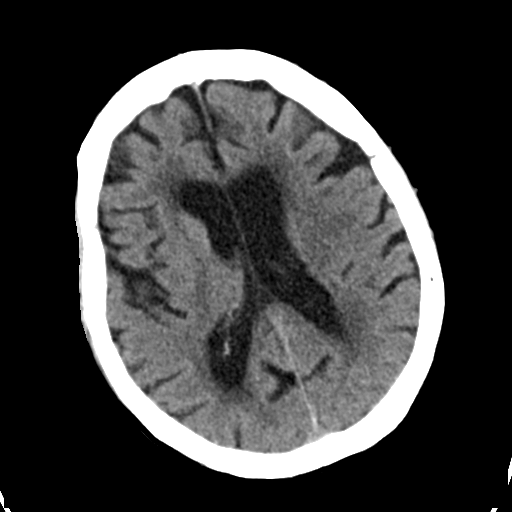
[im 22/34  brain]
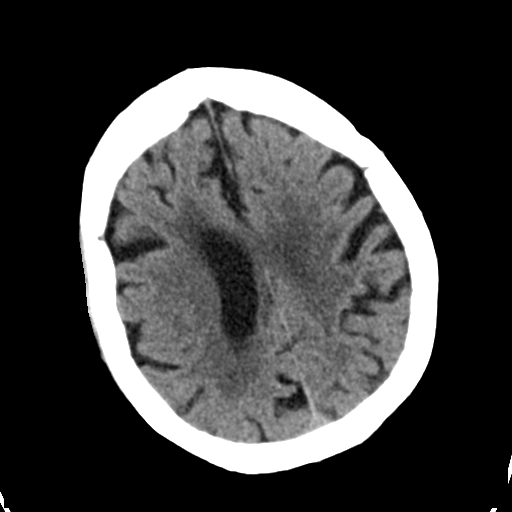
[im 26/34  brain]
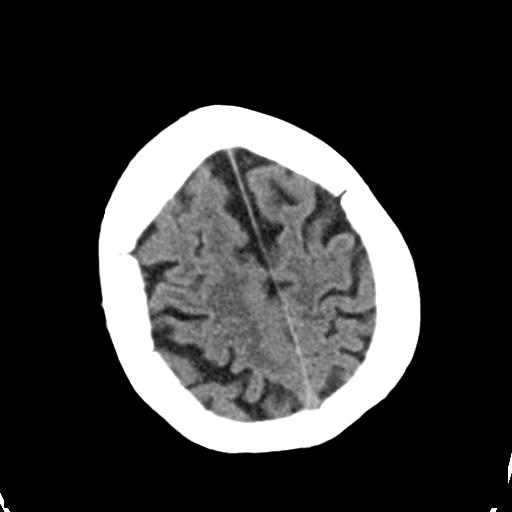
[im 28/34  brain]
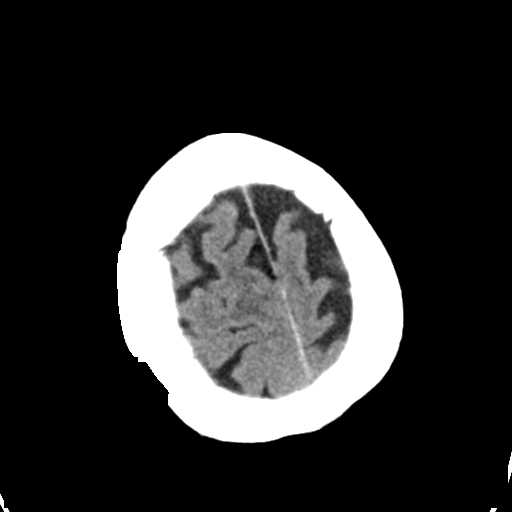
[im 28/34  bone]
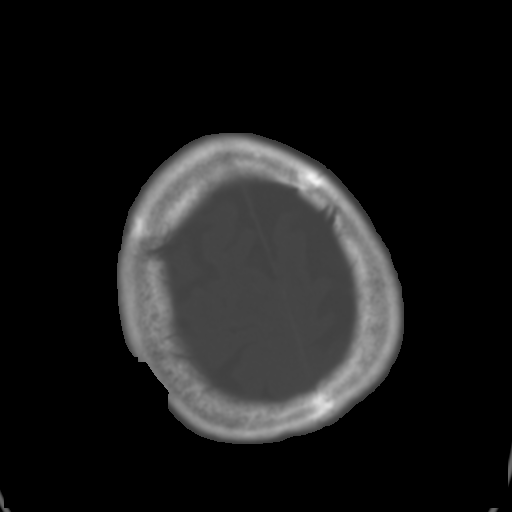
[im 31/34  brain]
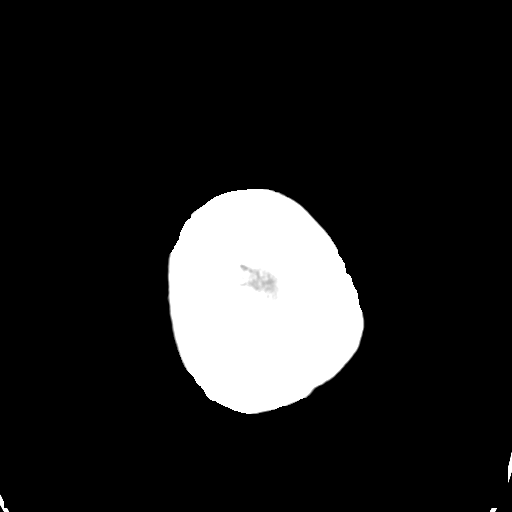

[Series 5: head 3.0 mpr cor · coronal · 0.32mm/px · 3 of 69 slices shown]
[im 23/69  brain]
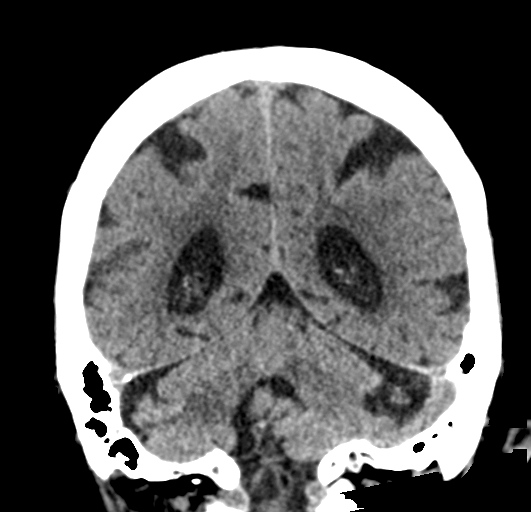
[im 31/69  brain]
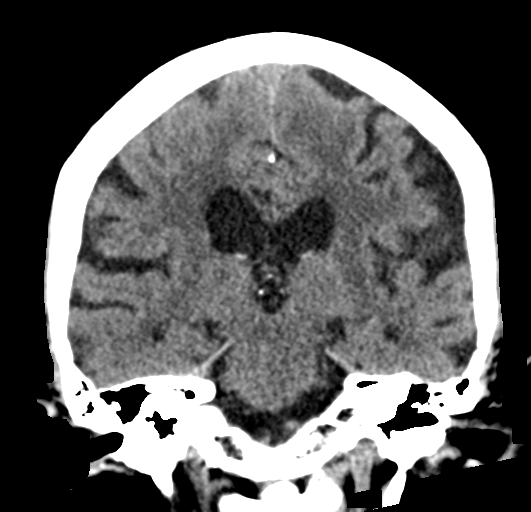
[im 38/69  brain]
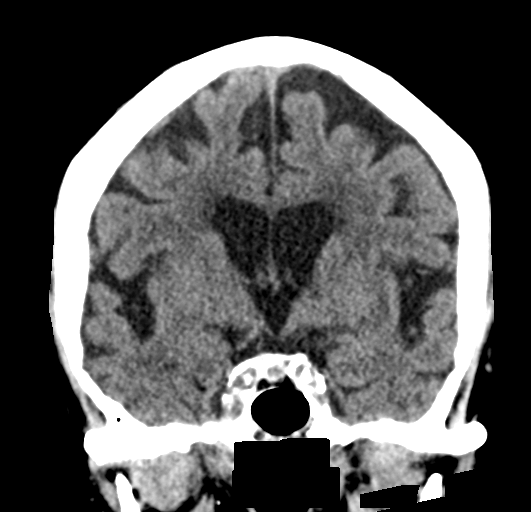

[Series 6: head 3.0 mpr sag · sagittal · 0.32mm/px · 3 of 57 slices shown]
[im 21/57  brain]
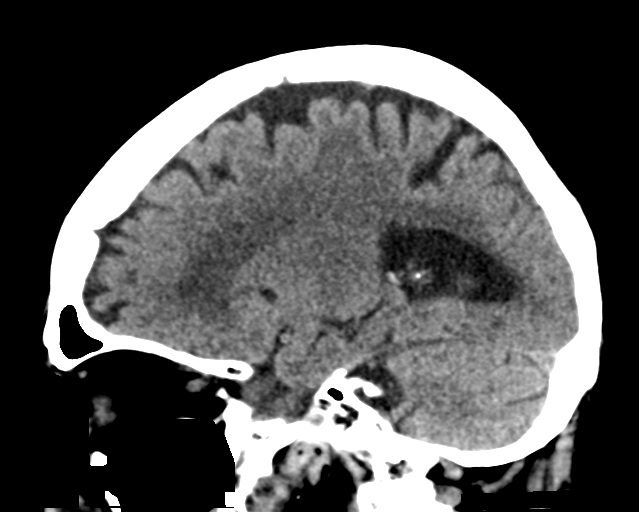
[im 29/57  brain]
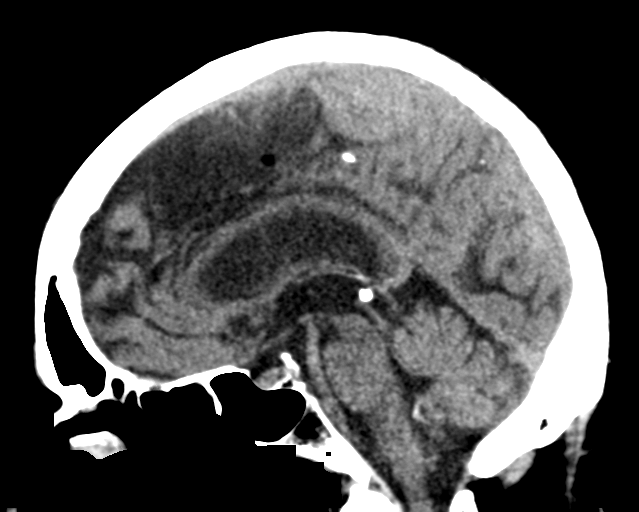
[im 36/57  brain]
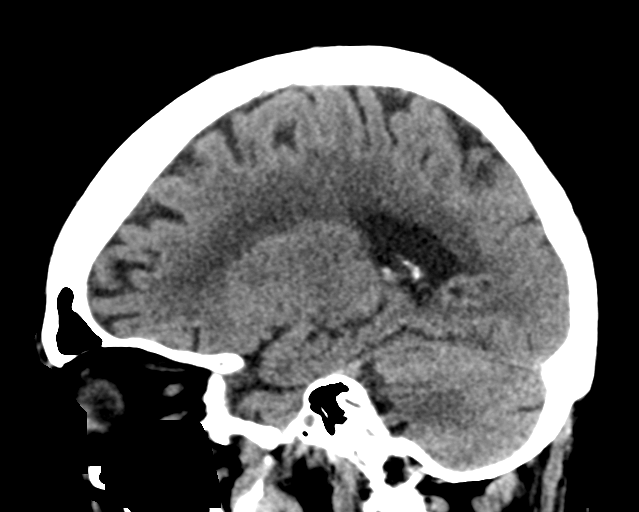

[16 of 47 positions shown; findings below may reference images not displayed]

FINDINGS: Brain: Age related atrophy. Chronic small-vessel ischemic changes of
the hemispheric white matter. Old lacunar infarction left basal
ganglia/anterior limb internal capsule, lateral left
thalamus/posterior limb internal capsule and right external capsule.
No mass, hemorrhage, hydrocephalus or extra-axial collection.

Vascular: There is atherosclerotic calcification of the major
vessels at the base of the brain.

Skull: No skull fracture

Sinuses/Orbits: Sinuses are clear. Old medial orbital blowout
fracture on the left.

Other: None
IMPRESSION: No acute or traumatic finding. Atrophy and chronic small-vessel
ischemic changes, progressive since [DATE].

## 2021-04-12 IMAGING — DX DG HIP (WITH OR WITHOUT PELVIS) 1V PORT*R*
2 series · 3 of 3 positions shown · non-contrast
Comparison: 11/08/2019.

CLINICAL DATA: ORIF right hip.

EXAM:
DG HIP (WITH OR WITHOUT PELVIS) 1V PORT RIGHT

[pelvis]
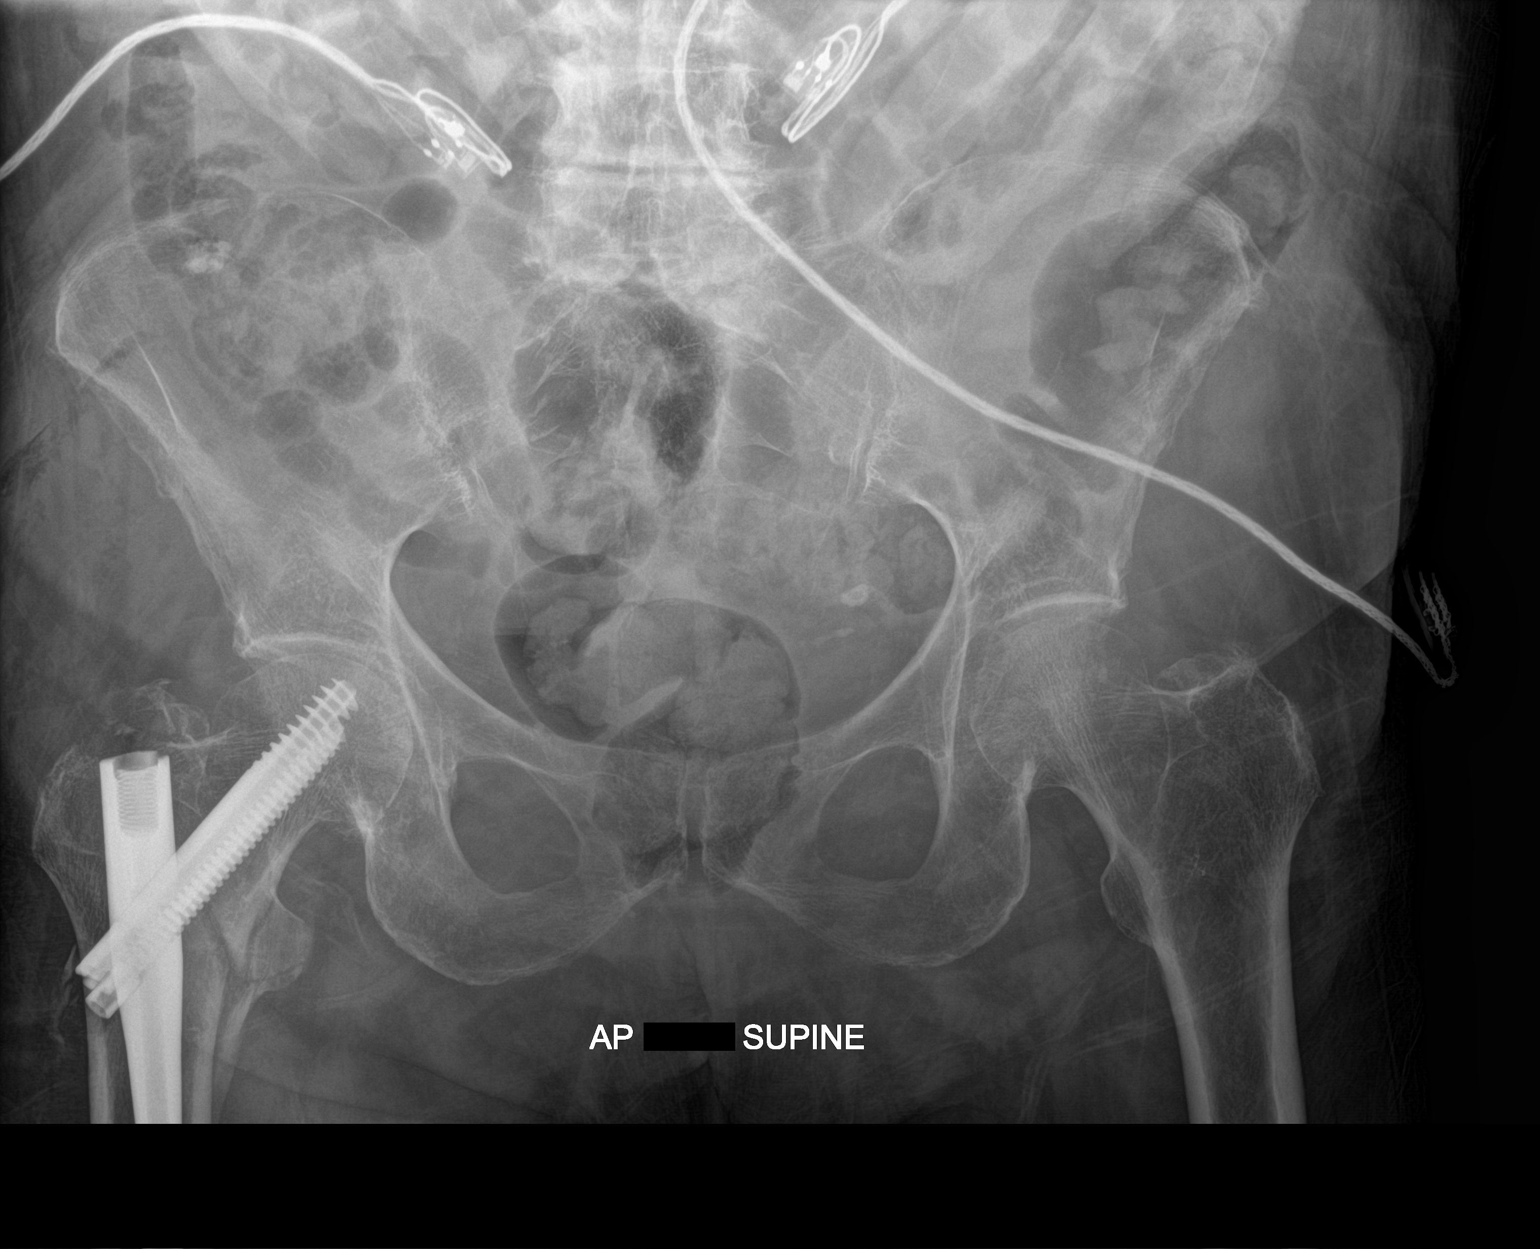

[Series 2: hip · 0.14mm/px · 2 of 2 slices shown]
[im 1/2]
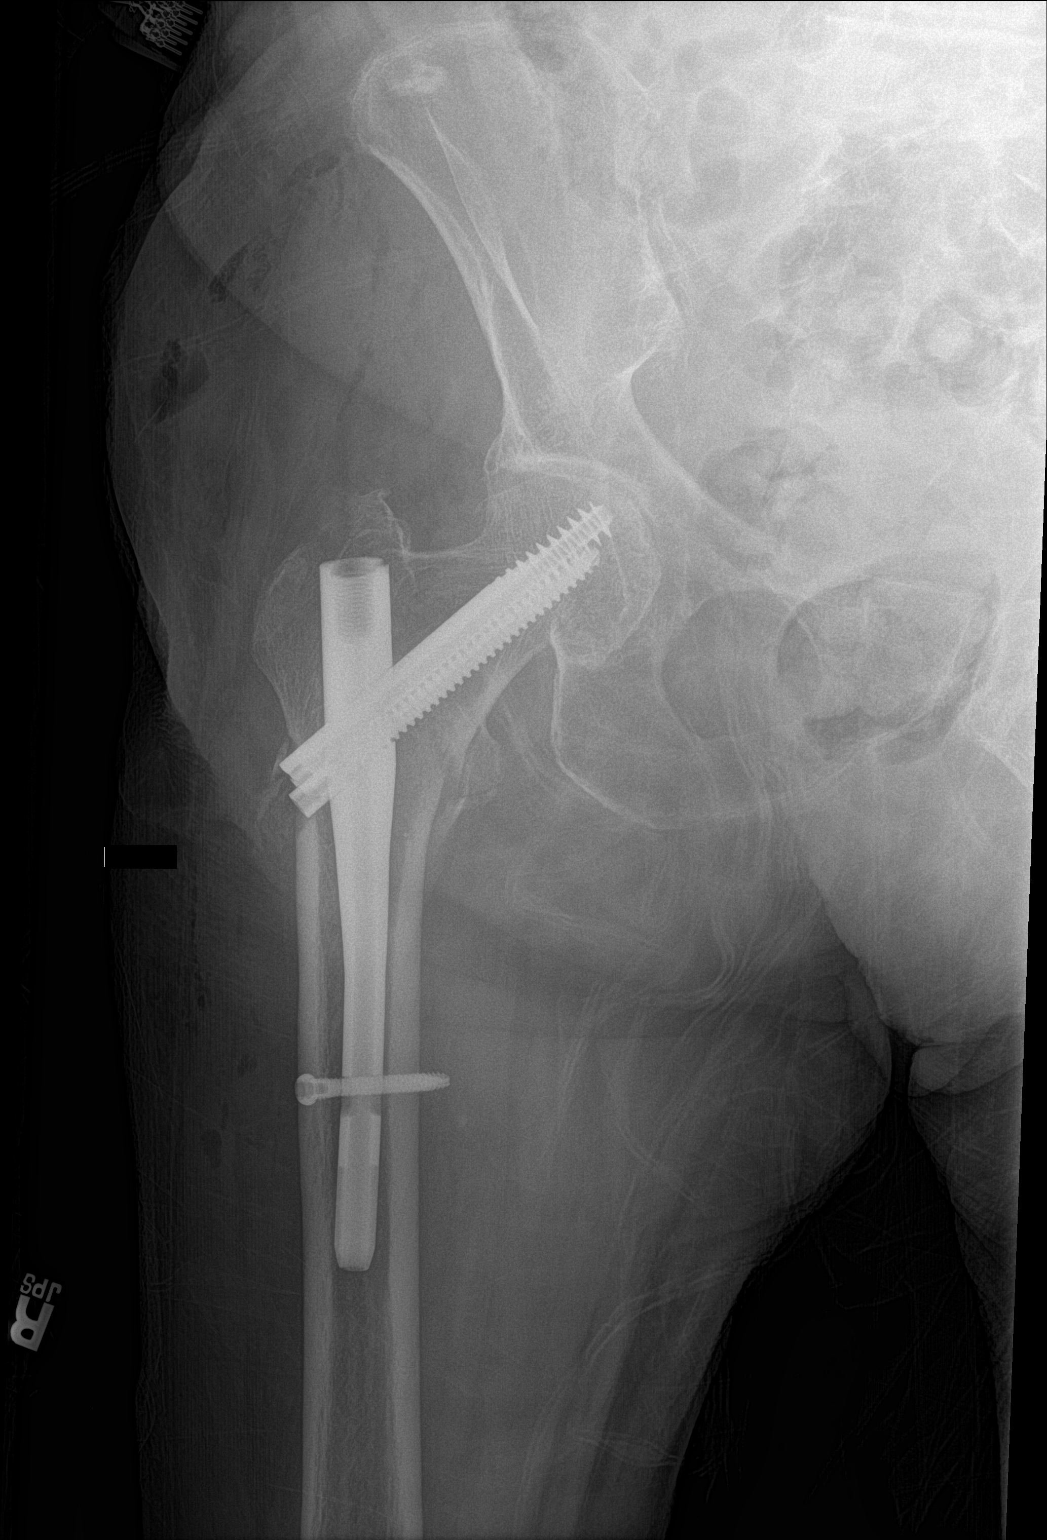
[im 2/2]
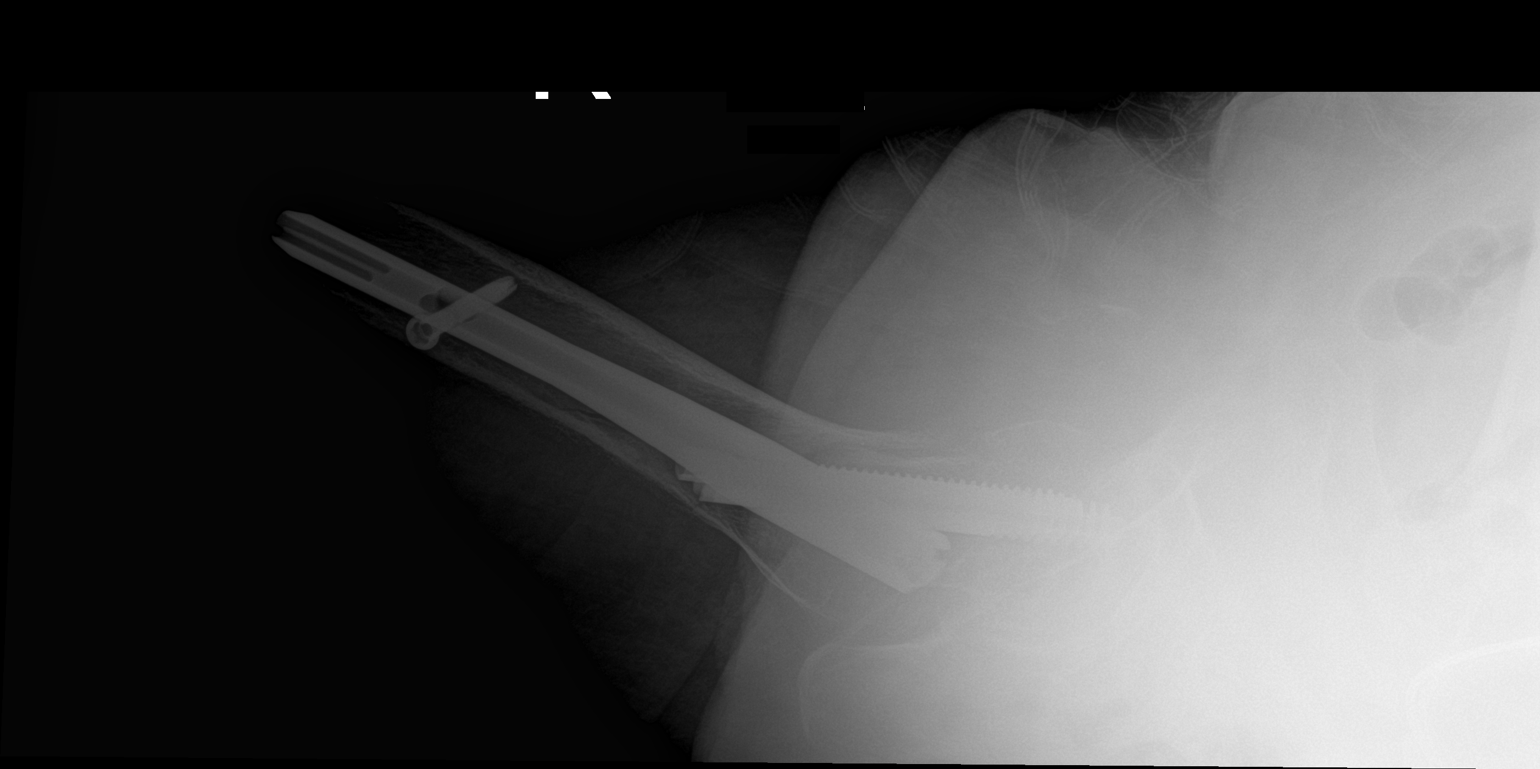

[3 of 3 positions shown; findings below may reference images not displayed]

FINDINGS: ORIF right hip. Hardware intact. Anatomic alignment. Degenerative
change lumbar spine and left hip. Diffuse osteopenia.
IMPRESSION: ORIF right hip.  Hardware intact.  Anatomic alignment.

## 2021-04-12 IMAGING — RF DG HIP (WITH PELVIS) OPERATIVE*R*
1 series · 5 of 5 positions shown · non-contrast
Comparison: November 07, 2019.

CLINICAL DATA: Intramedullary rod fixation.

EXAM:
OPERATIVE right HIP (WITH PELVIS IF PERFORMED) 5 VIEWS
TECHNIQUE: Fluoroscopic spot image(s) were submitted for interpretation
post-operatively.
FLUOROSCOPY TIME:  1 minutes 9 seconds.

[Series 1: run · 5 of 5 slices shown]
[im 1/5]
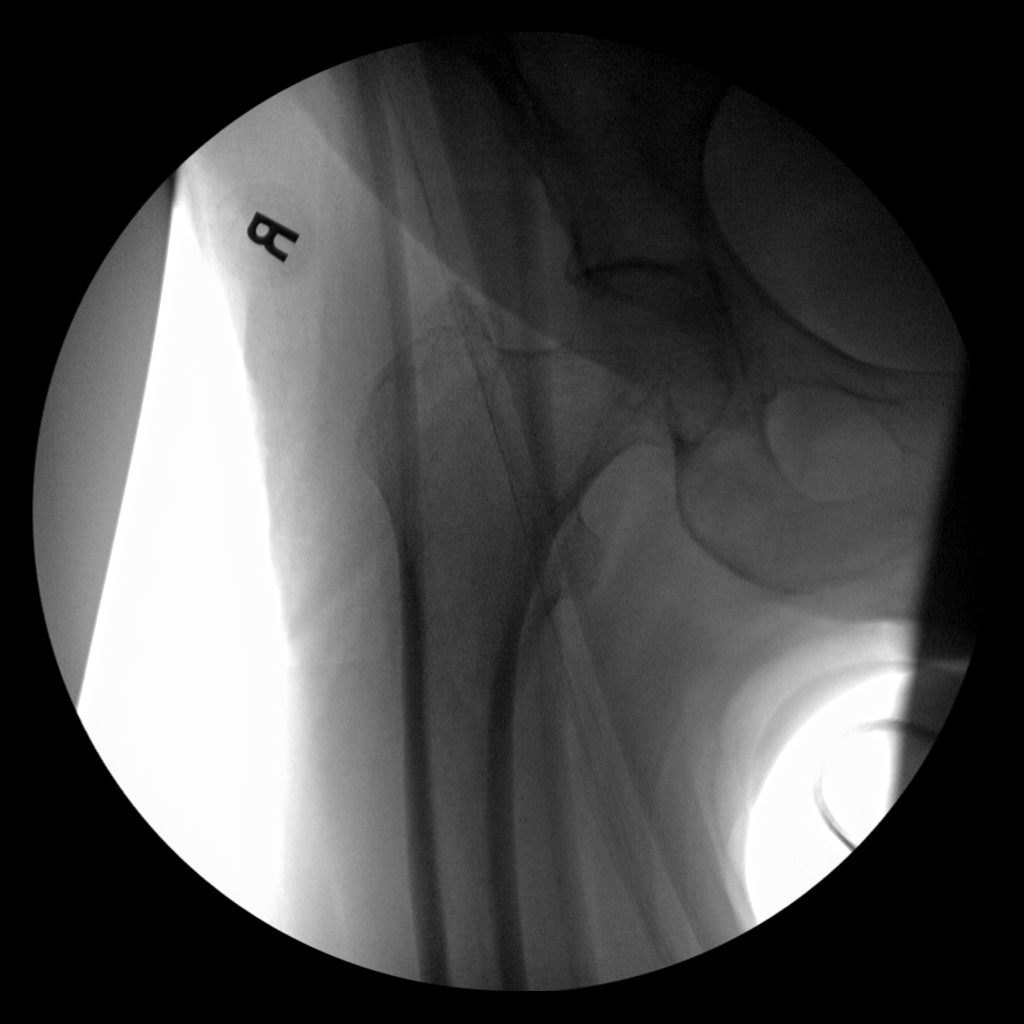
[im 2/5]
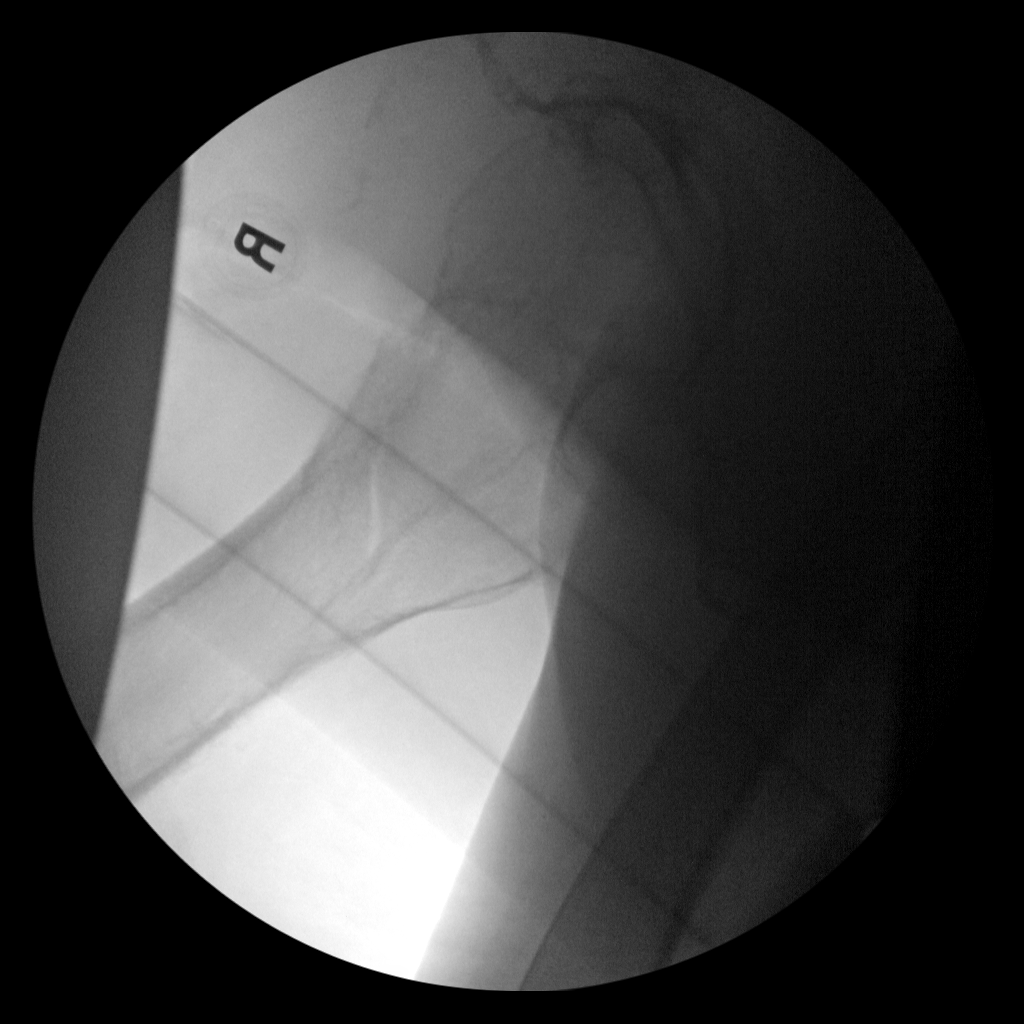
[im 3/5]
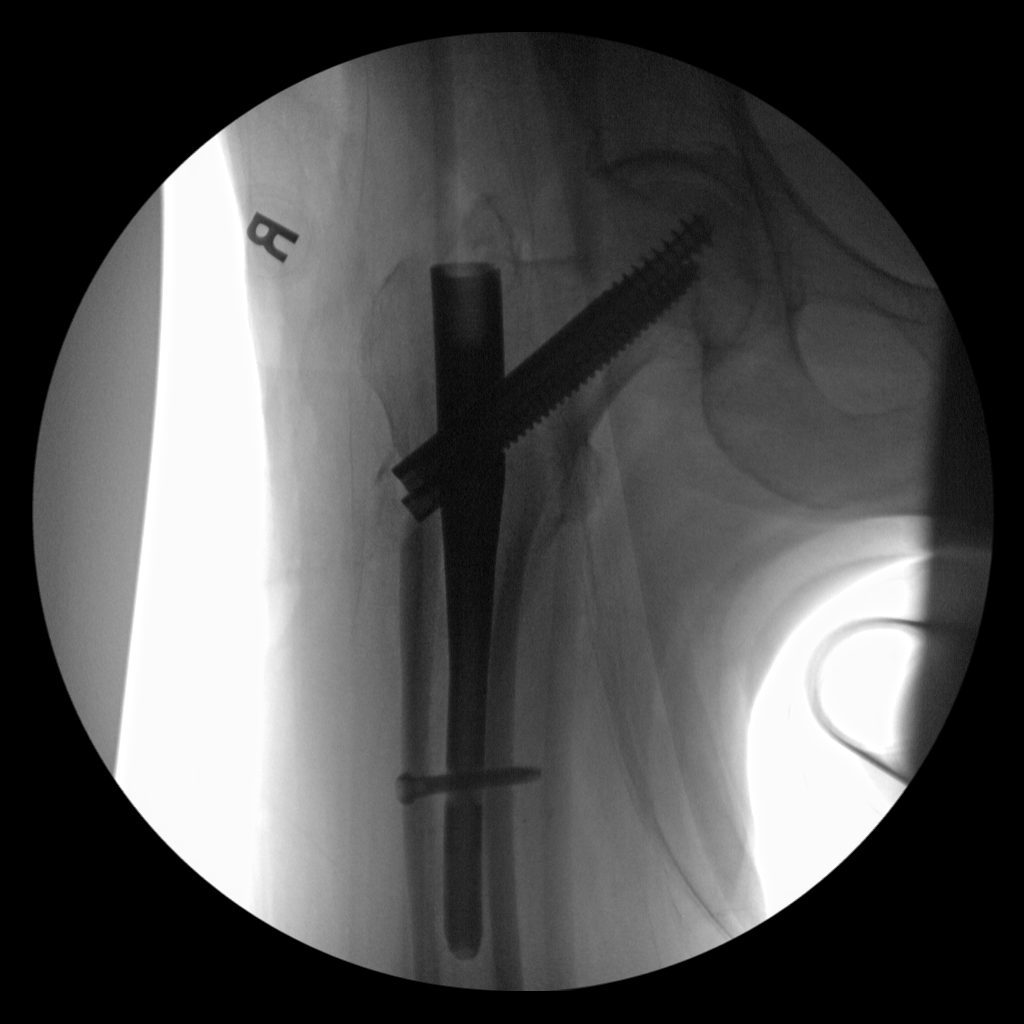
[im 4/5]
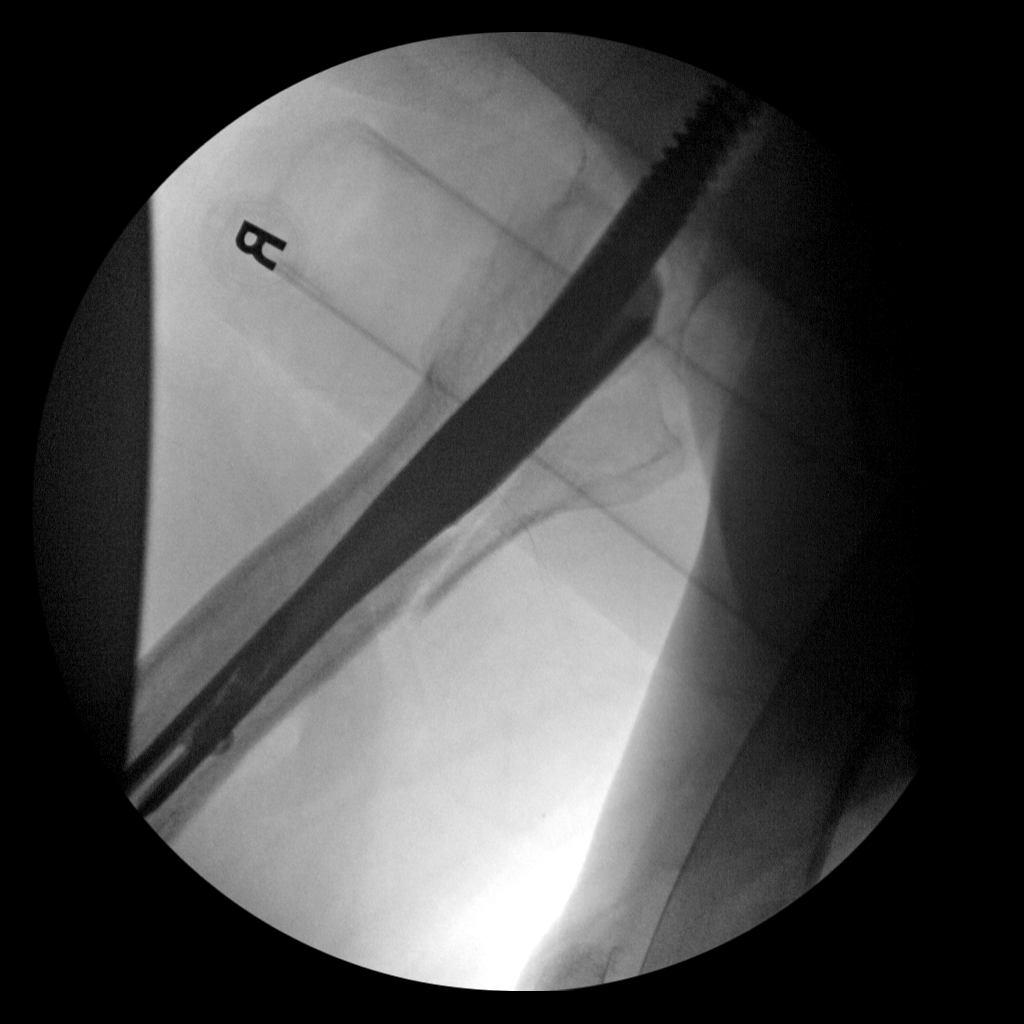
[im 5/5]
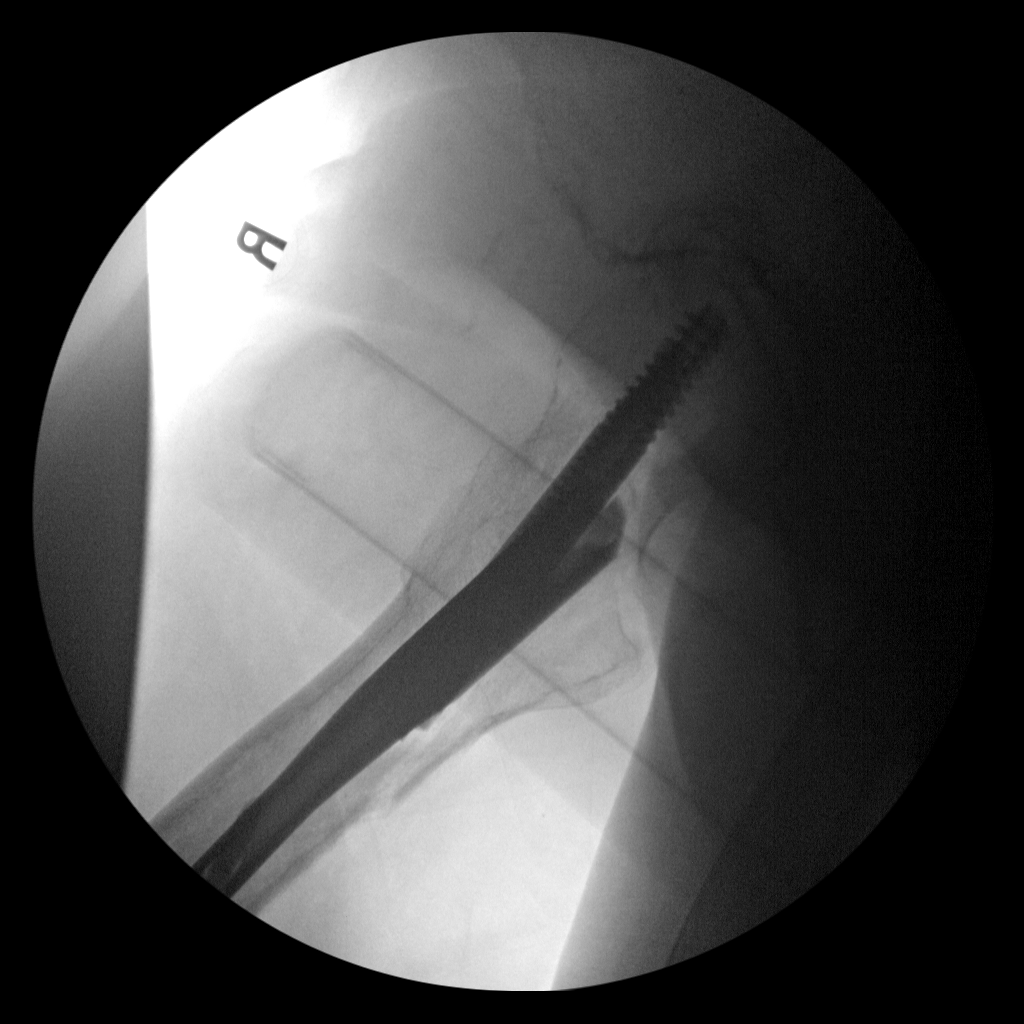

[5 of 5 positions shown; findings below may reference images not displayed]

FINDINGS: Five intraoperative fluoroscopic images were obtained of the right
hip. These demonstrate the surgical internal fixation of proximal
right femoral fracture. Good alignment of fracture components is
noted.
IMPRESSION: Status post surgical internal fixation of proximal right femoral
fracture.
